# Patient Record
Sex: Female | Born: 1950 | Race: White | Hispanic: No | Marital: Married | Smoking: Never smoker
Health system: Southern US, Community
[De-identification: ages and names within clinical notes are randomized; demographics above are authoritative.]

## PROBLEM LIST (undated history)

## (undated) DIAGNOSIS — G4730 Sleep apnea, unspecified: Secondary | ICD-10-CM

## (undated) DIAGNOSIS — T8859XA Other complications of anesthesia, initial encounter: Secondary | ICD-10-CM

## (undated) DIAGNOSIS — H10829 Rosacea conjunctivitis, unspecified eye: Secondary | ICD-10-CM

## (undated) DIAGNOSIS — Z9981 Dependence on supplemental oxygen: Secondary | ICD-10-CM

## (undated) DIAGNOSIS — E7800 Pure hypercholesterolemia, unspecified: Secondary | ICD-10-CM

## (undated) DIAGNOSIS — M1990 Unspecified osteoarthritis, unspecified site: Secondary | ICD-10-CM

## (undated) DIAGNOSIS — I10 Essential (primary) hypertension: Secondary | ICD-10-CM

## (undated) DIAGNOSIS — Z9889 Other specified postprocedural states: Secondary | ICD-10-CM

## (undated) DIAGNOSIS — L718 Other rosacea: Secondary | ICD-10-CM

## (undated) DIAGNOSIS — T7840XA Allergy, unspecified, initial encounter: Secondary | ICD-10-CM

## (undated) DIAGNOSIS — S72009A Fracture of unspecified part of neck of unspecified femur, initial encounter for closed fracture: Secondary | ICD-10-CM

## (undated) DIAGNOSIS — R112 Nausea with vomiting, unspecified: Secondary | ICD-10-CM

## (undated) DIAGNOSIS — T4145XA Adverse effect of unspecified anesthetic, initial encounter: Secondary | ICD-10-CM

## (undated) DIAGNOSIS — G4710 Hypersomnia, unspecified: Secondary | ICD-10-CM

## (undated) DIAGNOSIS — E079 Disorder of thyroid, unspecified: Secondary | ICD-10-CM

## (undated) DIAGNOSIS — R0902 Hypoxemia: Secondary | ICD-10-CM

## (undated) HISTORY — DX: Sleep apnea, unspecified: G47.30

## (undated) HISTORY — DX: Allergy, unspecified, initial encounter: T78.40XA

## (undated) HISTORY — DX: Rosacea conjunctivitis, unspecified eye: H10.829

## (undated) HISTORY — PX: TOTAL KNEE ARTHROPLASTY: SHX125

## (undated) HISTORY — DX: Unspecified osteoarthritis, unspecified site: M19.90

## (undated) HISTORY — DX: Fracture of unspecified part of neck of unspecified femur, initial encounter for closed fracture: S72.009A

## (undated) HISTORY — DX: Hypoxemia: R09.02

## (undated) HISTORY — PX: BREAST BIOPSY: SHX20

## (undated) HISTORY — DX: Pure hypercholesterolemia, unspecified: E78.00

## (undated) HISTORY — DX: Other rosacea: L71.8

## (undated) HISTORY — DX: Hypersomnia, unspecified: G47.10

## (undated) HISTORY — DX: Dependence on supplemental oxygen: Z99.81

## (undated) HISTORY — PX: JOINT REPLACEMENT: SHX530

---
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
## 1982-07-16 HISTORY — PX: TUBAL LIGATION: SHX77

## 1983-07-17 DIAGNOSIS — S72009A Fracture of unspecified part of neck of unspecified femur, initial encounter for closed fracture: Secondary | ICD-10-CM

## 1983-07-17 HISTORY — DX: Fracture of unspecified part of neck of unspecified femur, initial encounter for closed fracture: S72.009A

## 1985-07-16 HISTORY — PX: BREAST SURGERY: SHX581

## 1998-01-12 ENCOUNTER — Other Ambulatory Visit: Admission: RE | Admit: 1998-01-12 | Discharge: 1998-01-12 | Payer: Self-pay | Admitting: Obstetrics and Gynecology

## 1999-10-12 ENCOUNTER — Encounter: Admission: RE | Admit: 1999-10-12 | Discharge: 1999-10-12 | Payer: Self-pay | Admitting: Obstetrics and Gynecology

## 1999-10-12 ENCOUNTER — Encounter: Payer: Self-pay | Admitting: Obstetrics and Gynecology

## 2001-01-02 ENCOUNTER — Encounter: Payer: Self-pay | Admitting: Obstetrics and Gynecology

## 2001-01-02 ENCOUNTER — Encounter: Admission: RE | Admit: 2001-01-02 | Discharge: 2001-01-02 | Payer: Self-pay | Admitting: Obstetrics and Gynecology

## 2001-12-31 ENCOUNTER — Ambulatory Visit (HOSPITAL_BASED_OUTPATIENT_CLINIC_OR_DEPARTMENT_OTHER): Admission: RE | Admit: 2001-12-31 | Discharge: 2001-12-31 | Payer: Self-pay | Admitting: Orthopedic Surgery

## 2002-01-28 ENCOUNTER — Encounter: Admission: RE | Admit: 2002-01-28 | Discharge: 2002-01-28 | Payer: Self-pay | Admitting: Obstetrics and Gynecology

## 2002-01-28 ENCOUNTER — Encounter: Payer: Self-pay | Admitting: Obstetrics and Gynecology

## 2003-01-26 ENCOUNTER — Encounter: Admission: RE | Admit: 2003-01-26 | Discharge: 2003-01-26 | Payer: Self-pay | Admitting: Obstetrics and Gynecology

## 2003-01-26 ENCOUNTER — Encounter: Payer: Self-pay | Admitting: Obstetrics and Gynecology

## 2004-02-09 ENCOUNTER — Encounter: Admission: RE | Admit: 2004-02-09 | Discharge: 2004-02-09 | Payer: Self-pay | Admitting: Obstetrics and Gynecology

## 2005-05-23 ENCOUNTER — Other Ambulatory Visit: Admission: RE | Admit: 2005-05-23 | Discharge: 2005-05-23 | Payer: Self-pay | Admitting: Internal Medicine

## 2005-07-04 ENCOUNTER — Encounter: Admission: RE | Admit: 2005-07-04 | Discharge: 2005-07-04 | Payer: Self-pay | Admitting: Internal Medicine

## 2006-07-18 ENCOUNTER — Encounter: Admission: RE | Admit: 2006-07-18 | Discharge: 2006-07-18 | Payer: Self-pay | Admitting: Obstetrics and Gynecology

## 2008-03-17 ENCOUNTER — Encounter: Admission: RE | Admit: 2008-03-17 | Discharge: 2008-03-17 | Payer: Self-pay | Admitting: Family Medicine

## 2008-04-28 ENCOUNTER — Inpatient Hospital Stay (HOSPITAL_COMMUNITY): Admission: AD | Admit: 2008-04-28 | Discharge: 2008-05-01 | Payer: Self-pay | Admitting: Orthopedic Surgery

## 2009-05-12 ENCOUNTER — Encounter: Admission: RE | Admit: 2009-05-12 | Discharge: 2009-05-12 | Payer: Self-pay

## 2010-06-20 ENCOUNTER — Encounter: Admission: RE | Admit: 2010-06-20 | Discharge: 2010-06-20 | Payer: Self-pay | Admitting: Internal Medicine

## 2010-11-28 NOTE — Op Note (Signed)
NAMESHELI, [name] NO.:  [id number]   MEDICAL RECORD NO.:  [id number]          PATIENT TYPE:  OIB   LOCATION:  5035                         FACILITY:  [facility]   PHYSICIAN:  Loreta Ave, M.D. DATE OF BIRTH:  1950/09/04   DATE OF PROCEDURE:  04/28/2008  DATE OF DISCHARGE:                               OPERATIVE REPORT   PREOPERATIVE DIAGNOSES:  Left knee end-stage degenerative arthritis,  varus alignment, and flexion contracture.   POSTOPERATIVE DIAGNOSES:  Left knee end-stage degenerative arthritis,  varus alignment, and flexion contracture.   PROCEDURE:  Left total knee replacement with a modified minimally  invasive system.  [employer] components.  Soft tissue balancing  medial capsule release.  Cemented pegged posterior stabilized #4 femoral  component.  Cemented #4 tibial component with 9-mm polyethylene insert.  Resurfacing the cemented pegged medial offset 32-mm patellar component.   SURGEON:  Loreta Ave, MD   ASSISTANT:  Genene Churn. Barry Dienes, [place], present throughout the entire case and  necessary for timely completion of procedure.   ANESTHESIA:  General.   ESTIMATED BLOOD LOSS:  Minimal.   SPECIMENS:  None.   COMPLICATIONS:  None.   DRESSINGS:  Soft compressive knee immobilizer.   DRAINS:  Hemovac x1.   TOURNIQUET TIME:  1 hour.   PROCEDURE IN DETAIL:  The patient was brought to the operating room and  placed on the operative table in supine position.  After adequate  anesthesia had been obtained, the knee was examined.  A 5-degree flexion  contracture with further flexion of 100.  Alignment 5 degrees of varus  correctable to neutral.  Tourniquet was applied.  Prepped and draped in  the usual sterile fashion.  Exsanguinated and elevation with Esmarch.  Tourniquet inflated to 250 mmHg.  Straight incision above the patella  down to tibial tubercle.  Medial arthrotomy with vastus splitting and  preserving quad tendon.  Knee was  exposed.  Medial capsule was released.  Grade 4 changes throughout.  Remnants of menisci, cruciate ligaments,  and periarticular spurs were removed.  Distal femur was exposed.  Intramedullary guide was placed.  A 10-mm resection set at 5 degrees of  valgus.  Using epicondylar axis, it was marked, sized, cut, and fitted  for #4 pegged posterior stabilized femoral component.  Proximal tibia  was exposed.  Extramedullary guide was placed.  A 3-degree posterior  slope cut.  Extramedullary guide.  Sufficient resection for the 9-mm  insert.  Size #4 component.  Recessed, examined, and all spurs were  removed.  Comfort with all cuts.  With trials in place, #4 on the femur,  #4 on the tibia, and a 9-mm insert, full extension, full flexion, good  alignment, and good stability.  Knee was marked for rotation of the  tibia and then tibia hand-reamed.  Patella was exposed.  Spurs were  removed.  Posterior 10 mm was resected, drilled, sized, and fitted for a  32-mm component.  All trials were removed.  Copious irrigation with  pulse irrigating device.  Cement prepared and placed on all components  which were firmly  seated.  Polyethylene attached to tibia and the knee  was reduced.  Once the cement hardened, it was reexamined.  Full  extension, full flexion, good alignment, good stability, and good  patellofemoral tracking.  Hemovac placed and brought out through a  separate stab wound.  Arthrotomy closed with #1 Vicryl.  Skin and  subcutaneous tissue with Vicryl and staples.  Knee injected with  Marcaine and Hemovac clamped.  Sterile compressive dressing was applied.  Tourniquet was deflated and removed.  Knee immobilizer was applied.  Anesthesia was reversed.  Brought to the recovery room.  Tolerated the  surgery well.  No complications.      Loreta Ave, M.D.  Electronically Signed     DFM/MEDQ  D:  04/28/2008  T:  04/28/2008  Job:  [id number]

## 2010-12-01 NOTE — Procedures (Signed)
[facility]  Patient:    Angelica Levine, Angelica Levine Visit Number: [id number] MRN: [id number]          Service Type: DSU Location: [facility] Attending Physician:  Colbert Ewing Dictated by:   Loreta Ave, M.D. Proc. 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  Tethering as well.  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  Tethering as well.  OPERATIVE PROCEDURE:  Right knee exam under anesthesia, arthroscopy, with extensive chondroplasty of the patella.  Arthroscopic lateral retinacular release.  SURGEON:  Loreta Ave, M.D.  ASSISTANT:  Arlys John D. Petrarca, P.A.-C.  ANESTHESIA:  General.  BLOOD LOSS:  Minimal.  TOURNIQUET:  Not employed.  SPECIMENS:  None.  CULTURES: None.  COMPLICATIONS:  None.  DRESSINGS:  Soft compressive with lateral bolster.  PROCEDURE:  The patient was brought to the operating room and placed on the operating table in the supine position.  After adequate anesthesia had been obtained, both knees were examination.  Full motion and good stability. Patellofemoral crepitus.  Lateral tracking.  Lateral tethering, right greater than left.  Tourniquet and leg holder applied, right leg.  Prepped and draped in the usual sterile fashion.  Three portals created, one superolateral, one each medial and lateral patellar.  Inflow catheter introduced.  Knee distended.  Arthroscope introduced.  Knee inspected.  Significant grade 3 chondromalacia, especially on the peak of the patella and lateral facet. Marked lateral tracking and tethering.  Trochlea looked good.  Extensive chondroplasty of the patella to a stable surface where there were areas of grade 3 change with fibrillation.  Chondral loose bodies were  removed. Remaining knee examined.  Cruciate ligaments, medial and lateral compartment, and medial and lateral meniscus normal.  After thorough chondroplasty and assessment of tracking, arthroscopic release performed from vastus lateralis superiorly down to the lateral joint line inferiorly.  Marked improvement of tethering with good restoration of tracking and even chondral forces throughout the patellofemoral joint achieved after lateral release. Hemostasis with cautery.  The instruments and fluid removed.  Portals of the knee were injected with Marcaine.  Portals closed with 4-0 nylon.  Sterile compressive dressing applied.  Anesthesia reversed.  Brought to the recovery room.  Tolerated surgery well with no complications. Dictated by:   Loreta Ave, M.D. Attending Physician:  Colbert Ewing DD:  12/31/01 TD:  01/01/02 Job: [id number]

## 2010-12-01 NOTE — Discharge Summary (Signed)
NAMEJACALYNN, BUZZELL                ACCOUNT NO.:  [id number]   MEDICAL RECORD NO.:  [id number]          PATIENT TYPE:  INP   LOCATION:  5035                         FACILITY:  [facility]   PHYSICIAN:  Loreta Ave, M.D. DATE OF BIRTH:  1951-06-28   DATE OF ADMISSION:  04/28/2008  DATE OF DISCHARGE:  05/01/2008                               DISCHARGE SUMMARY   FINAL DIAGNOSES:  1. Status post left total knee replacement with end-stage degenerative      joint disease.  2. Hypothyroidism.  3. Diverticulosis.   HISTORY OF PRESENT ILLNESS:  This is a 60 year old white female with  history of end-stage DJD left knee and chronic pain presented to our  office for preop evaluation for a total knee replacement.  She had  progressive worsening pain with failed response to conservative  treatment.  Significant decrease in her daily activities due to the  ongoing complaint.   HOSPITAL COURSE:  On April 28, 2008, the patient was taken to the  [facility] OR and a left total knee replacement procedure performed.   SURGEON:  Loreta Ave, MD   ASSISTANCE:  Genene Churn. Barry Dienes, [place]   ANESTHESIA:  General.   SPECIMENS:  None.   ESTIMATED BLOOD LOSS:  Minimal.   TOURNIQUET TIME:  70 minutes.   DRAINS:  1 Hemovac drain placed.   There were no surgical or anesthesia complications, and the patient was  transferred to Recovery in stable condition.   On April 29, 2008, the patient was doing well with good pain control.  Vital signs stable and febrile.  Hemoglobin 11.2 and INR 1.2.  Dressing  clean, dry, and intact.  Calf nontender and neurovascularly intact.  Chest CT scan ordered due to abnormal preop chest x-ray.  Pharmacy  protocol Coumadin started.  PT/OT consults.  On April 30, 2008, the  patient doing well with good pain control.  Temperature 100.5, pulse  101, respirations 20, and blood pressure 150/83.  Hemoglobin 10.7 and  INR 1.4.  Electrolytes stable.  Wound looks good and  staples intact.  No  drains or signs of infection.  Hemovac drain discontinued.  Calf  nontender and neurovascularly intact.  The patient is progressing well  with therapy.  Discontinued PCA, Foley and IV.  CT chest showed a left  lung hilar calcified granuloma.  Calcification in spleen.  Minimal  atelectasis.  I  recommended followup CT in a year.  On May 01, 2008, the patient doing well with good pain control.  She states that  she is ready to go home.  The patient complained about dysuria .  UA  positive for UTI.  Cipro was started.  Wound looks good and staples  intact.  No drainage or signs of infection.  Calf nontender and  neurovascularly intact.   DISCHARGE MEDICATIONS:  1. Percocet 10/325 1-2 tablets p.o. every 4-6 hours p.r.n. for pain.  2. Robaxin 500 mg 1 tablet p.o. every 6 hours for her spasms.  3. Coumadin pharmacy protocol.  4. Lovenox 30 mg 1 subcu injection b.i.d. x3 days stop when Coumadin  is a therapeutic with INR of 2-3.  5. Cipro 500 mg one tablet p.o. b.i.d. x5 days for UTI.   DISPOSITION:  Discharged home.   CONDITION:  Good and stable.   INSTRUCTIONS:  The patient will work with home health PT and OT to  improve knee strengthening and range of motion.  Weightbearing as  tolerated.  Daily dressing changes with 4x4 gauze and tape.  Coumadin x4  weeks postop for DVT prophylaxis.  Follow up when she is at 2 weeks open  surgery for recheck.  Return sooner if needed.      Loreta Ave, M.D.  Electronically Signed     DFM/MEDQ  D:  06/30/2008  T:  07/01/2008  Job:  [id number]

## 2011-03-28 ENCOUNTER — Other Ambulatory Visit: Payer: Self-pay | Admitting: Obstetrics and Gynecology

## 2011-03-28 DIAGNOSIS — Z1231 Encounter for screening mammogram for malignant neoplasm of breast: Secondary | ICD-10-CM

## 2011-04-17 LAB — COMPREHENSIVE METABOLIC PANEL
AST: 34
Albumin: 3.9
Alkaline Phosphatase: 95
CO2: 28
Calcium: 9.7
Chloride: 104
Glucose, Bld: 79
Total Bilirubin: 0.5

## 2011-04-17 LAB — BASIC METABOLIC PANEL
BUN: 4 — ABNORMAL LOW
BUN: 6
BUN: 6
CO2: 27
CO2: 29
Calcium: 8.6
Calcium: 8.8
Chloride: 102
Chloride: 102
Chloride: 99
Creatinine, Ser: 0.65
Creatinine, Ser: 0.71
GFR calc Af Amer: >60
GFR calc Af Amer: >60
GFR calc non Af Amer: >60
Glucose, Bld: 144 — ABNORMAL HIGH
Glucose, Bld: 197 — ABNORMAL HIGH
Sodium: 139

## 2011-04-17 LAB — URINALYSIS, ROUTINE W REFLEX MICROSCOPIC
Bilirubin Urine: NEGATIVE
Glucose, UA: NEGATIVE
Hgb urine dipstick: NEGATIVE
Ketones, ur: NEGATIVE
Specific Gravity, Urine: 1.006
Urobilinogen, UA: 0.2
pH: 7

## 2011-04-17 LAB — CBC
HCT: 33.2 — ABNORMAL LOW
HCT: 42.6
Hemoglobin: 11.1 — ABNORMAL LOW
Hemoglobin: 14.4
MCHC: 33.8
MCHC: 33.8
MCHC: 34.1
MCV: 88.1
Platelets: 199
Platelets: 223
Platelets: 275
RBC: 3.56 — ABNORMAL LOW
RBC: 3.69 — ABNORMAL LOW
RBC: 4.86
RDW: 12.9
RDW: 13.2
WBC: 10.3
WBC: 7

## 2011-04-17 LAB — URINE MICROSCOPIC-ADD ON

## 2011-04-17 LAB — TYPE AND SCREEN: Antibody Screen: NEGATIVE

## 2011-04-17 LAB — PROTIME-INR
Prothrombin Time: 13.5
Prothrombin Time: 15.2

## 2011-04-17 LAB — URINE CULTURE: Colony Count: 45000

## 2011-04-20 ENCOUNTER — Ambulatory Visit
Admission: RE | Admit: 2011-04-20 | Discharge: 2011-04-20 | Disposition: A | Discharge status: Home / Self Care | Payer: BC Managed Care – PPO | Source: Ambulatory Visit | Attending: Obstetrics and Gynecology | Admitting: Obstetrics and Gynecology

## 2011-04-20 DIAGNOSIS — Z1231 Encounter for screening mammogram for malignant neoplasm of breast: Secondary | ICD-10-CM

## 2011-06-12 ENCOUNTER — Other Ambulatory Visit: Payer: Self-pay | Admitting: Internal Medicine

## 2011-06-12 DIAGNOSIS — Z1231 Encounter for screening mammogram for malignant neoplasm of breast: Secondary | ICD-10-CM

## 2011-06-22 ENCOUNTER — Ambulatory Visit
Admission: RE | Admit: 2011-06-22 | Discharge: 2011-06-22 | Disposition: A | Discharge status: Home / Self Care | Payer: BC Managed Care – PPO | Source: Ambulatory Visit | Attending: Internal Medicine | Admitting: Internal Medicine

## 2011-06-22 DIAGNOSIS — Z1231 Encounter for screening mammogram for malignant neoplasm of breast: Secondary | ICD-10-CM

## 2011-11-19 ENCOUNTER — Encounter: Payer: Self-pay | Admitting: Internal Medicine

## 2011-11-19 ENCOUNTER — Ambulatory Visit (INDEPENDENT_AMBULATORY_CARE_PROVIDER_SITE_OTHER): Payer: BC Managed Care – PPO | Admitting: Internal Medicine

## 2011-11-19 VITALS — BP 163/81 | HR 64 | Temp 97.8°F | Resp 16 | Ht 64.25 in | Wt 163.4 lb

## 2011-11-19 DIAGNOSIS — E789 Disorder of lipoprotein metabolism, unspecified: Secondary | ICD-10-CM

## 2011-11-19 DIAGNOSIS — E7889 Other lipoprotein metabolism disorders: Secondary | ICD-10-CM

## 2011-11-19 DIAGNOSIS — M1990 Unspecified osteoarthritis, unspecified site: Secondary | ICD-10-CM | POA: Insufficient documentation

## 2011-11-19 DIAGNOSIS — J309 Allergic rhinitis, unspecified: Secondary | ICD-10-CM | POA: Insufficient documentation

## 2011-11-19 DIAGNOSIS — E039 Hypothyroidism, unspecified: Secondary | ICD-10-CM | POA: Insufficient documentation

## 2011-11-19 DIAGNOSIS — M79609 Pain in unspecified limb: Secondary | ICD-10-CM

## 2011-11-19 DIAGNOSIS — R634 Abnormal weight loss: Secondary | ICD-10-CM

## 2011-11-19 DIAGNOSIS — M79646 Pain in unspecified finger(s): Secondary | ICD-10-CM

## 2011-11-19 LAB — CBC WITH DIFFERENTIAL/PLATELET
Basophils Relative: 0 % (ref 0–1)
Hemoglobin: 14.9 g/dL (ref 12.0–15.0)
Lymphocytes Relative: 23 % (ref 12–46)
MCHC: 34 g/dL (ref 30.0–36.0)
Monocytes Relative: 5 % (ref 3–12)
Neutro Abs: 5.1 10*3/uL (ref 1.7–7.7)
Neutrophils Relative %: 71 % (ref 43–77)
RBC: 5.02 MIL/uL (ref 3.87–5.11)
WBC: 7.2 10*3/uL (ref 4.0–10.5)

## 2011-11-19 LAB — POCT GLYCOSYLATED HEMOGLOBIN (HGB A1C): Hemoglobin A1C: 5.5

## 2011-11-19 LAB — GLUCOSE, POCT (MANUAL RESULT ENTRY): POC Glucose: 99

## 2011-11-19 NOTE — Progress Notes (Signed)
Subjective:    Patient ID: Angelica Levine, female    DOB: [birth year], 61 y.o.   MRN: [id number]  HPI Needs thyroid and triglycerides cked. Also has a thumb popping, has djd many joints Also has dry mouth and worried about diabetes  Review of Systems See problem list    Objective:   Physical Exam Left trigger thumb, not red or tender Thyroid normal size Lungs and heart normal  BP 154/84 Results for orders placed in visit on 11/19/11  GLUCOSE, POCT (MANUAL RESULT ENTRY)      Component Value Range   POC Glucose 99    POCT GLYCOSYLATED HEMOGLOBIN (HGB A1C)      Component Value Range   Hemoglobin A1C 5.5      Results for orders placed during the hospital encounter of 04/28/08  CBC      Component Value Range   WBC 7.9     RBC 4.86     Hemoglobin 14.4     HCT 42.6     MCV 87.7     MCHC 33.8     RDW 13.2     Platelets 275    COMPREHENSIVE METABOLIC PANEL      Component Value Range   Sodium 141     Potassium 3.8     Chloride 104     CO2 28     Glucose, Bld 79     BUN 9     Creatinine, Ser 0.70     Calcium 9.7     Total Protein 6.9     Albumin 3.9     AST 34     ALT 37 (*)    Alkaline Phosphatase 95     Total Bilirubin 0.5     GFR calc non Af Amer >60     GFR calc Af Amer       Value: >60            The eGFR has been calculated     using the MDRD equation.     This calculation has not been     validated in all clinical  PROTIME-INR      Component Value Range   Prothrombin Time 13.5     INR 1.0    APTT      Component Value Range   aPTT 28    URINALYSIS, ROUTINE W REFLEX MICROSCOPIC      Component Value Range   Color, Urine YELLOW     APPearance CLEAR     Specific Gravity, Urine 1.013     pH 5.5     Glucose, UA NEGATIVE     Hgb urine dipstick NEGATIVE     Bilirubin Urine NEGATIVE     Ketones, ur NEGATIVE     Protein, ur NEGATIVE     Urobilinogen, UA 0.2     Nitrite NEGATIVE     Leukocytes, UA       Value: NEGATIVE MICROSCOPIC NOT DONE ON URINES WITH  NEGATIVE PROTEIN, BLOOD, LEUKOCYTES, NITRITE, OR GLUCOSE <1000 mg/dL.  TYPE AND SCREEN      Component Value Range   ABO/RH(D) O POS     Antibody Screen NEG     Sample Expiration 05/01/2008    ABO/RH      Component Value Range   ABO/RH(D) O POS    BASIC METABOLIC PANEL      Component Value Range   Sodium 136     Potassium 4.1     Chloride 102  CO2 29     Glucose, Bld 144 (*)    BUN 6     Creatinine, Ser 0.65     Calcium 8.6     GFR calc non Af Amer >60     GFR calc Af Amer       Value: >60            The eGFR has been calculated     using the MDRD equation.     This calculation has not been     validated in all clinical  CBC      Component Value Range   WBC 7.0     RBC 3.76 (*)    Hemoglobin 11.2 (*)    HCT 33.2 (*)    MCV 88.3     MCHC 33.8     RDW 12.9     Platelets 199    PROTIME-INR      Component Value Range   Prothrombin Time 15.2     INR 1.2    BASIC METABOLIC PANEL      Component Value Range   Sodium 139     Potassium 4.4     Chloride 102     CO2 32     Glucose, Bld 144 (*)    BUN 4 (*)    Creatinine, Ser 0.55     Calcium 8.6     GFR calc non Af Amer >60     GFR calc Af Amer       Value: >60            The eGFR has been calculated     using the MDRD equation.     This calculation has not been     validated in all clinical  CBC      Component Value Range   WBC 7.4     RBC 3.56 (*)    Hemoglobin 10.7 (*)    HCT 31.6 (*)    MCV 88.9     MCHC 33.9     RDW 13.1     Platelets 197    PROTIME-INR      Component Value Range   Prothrombin Time 17.6 (*)    INR 1.4    BASIC METABOLIC PANEL      Component Value Range   Sodium 136     Potassium 4.0     Chloride 99     CO2 27     Glucose, Bld 197 (*)    BUN 6     Creatinine, Ser 0.71     Calcium 8.8     GFR calc non Af Amer >60     GFR calc Af Amer       Value: >60            The eGFR has been calculated     using the MDRD equation.     This calculation has not been     validated in  all clinical  CBC      Component Value Range   WBC 10.3     RBC 3.69 (*)    Hemoglobin 11.1 (*)    HCT 32.5 (*)    MCV 88.1     MCHC 34.1     RDW 12.8     Platelets 223    PROTIME-INR      Component Value Range   Prothrombin Time 17.6 (*)    INR 1.4    URINE CULTURE  Component Value Range   Specimen Description URINE, CLEAN CATCH     Special Requests IMMUNE:NORM UT SYMPT:NEG     Colony Count 45,000 COLONIES/ML     Culture ESCHERICHIA COLI     Report Status 05/03/2008 FINAL     Organism ID, Bacteria ESCHERICHIA COLI    URINALYSIS, ROUTINE W REFLEX MICROSCOPIC      Component Value Range   Color, Urine YELLOW     APPearance CLOUDY (*)    Specific Gravity, Urine 1.006     pH 7.0     Glucose, UA NEGATIVE     Hgb urine dipstick LARGE (*)    Bilirubin Urine NEGATIVE     Ketones, ur NEGATIVE     Protein, ur NEGATIVE     Urobilinogen, UA 0.2     Nitrite NEGATIVE     Leukocytes, UA LARGE (*)   URINE MICROSCOPIC-ADD ON      Component Value Range   Squamous Epithelial / LPF RARE     WBC, UA 11-20     RBC / HPF 3-6     Bacteria, UA MANY (*)        Assessment & Plan:  Thumb spica splint DC NSAIDS for bp controll Record home BP, if abn. rtc 2-3weeks CPE in July RF meds 3 mos.

## 2011-11-20 LAB — COMPREHENSIVE METABOLIC PANEL
ALT: 13 U/L (ref 0–35)
AST: 19 U/L (ref 0–37)
Albumin: 4.5 g/dL (ref 3.5–5.2)
Alkaline Phosphatase: 90 U/L (ref 39–117)
Calcium: 9.8 mg/dL (ref 8.4–10.5)
Chloride: 104 mEq/L (ref 96–112)
Potassium: 4.2 mEq/L (ref 3.5–5.3)
Sodium: 139 mEq/L (ref 135–145)
Total Protein: 7.5 g/dL (ref 6.0–8.3)

## 2011-11-20 LAB — LIPID PANEL
HDL: 43 mg/dL (ref >39)
LDL Cholesterol: 155 mg/dL — ABNORMAL HIGH (ref 0–99)

## 2011-11-20 LAB — TSH: TSH: 1.74 u[IU]/mL (ref 0.350–4.500)

## 2012-01-07 ENCOUNTER — Encounter (HOSPITAL_COMMUNITY): Payer: Self-pay | Admitting: *Deleted

## 2012-01-07 ENCOUNTER — Encounter (HOSPITAL_COMMUNITY): Payer: Self-pay | Admitting: Emergency Medicine

## 2012-01-07 ENCOUNTER — Emergency Department (HOSPITAL_COMMUNITY)
Admission: EM | Admit: 2012-01-07 | Discharge: 2012-01-07 | Disposition: A | Discharge status: Home / Self Care | Payer: BC Managed Care – PPO | Attending: Emergency Medicine | Admitting: Emergency Medicine

## 2012-01-07 ENCOUNTER — Emergency Department (HOSPITAL_COMMUNITY): Payer: BC Managed Care – PPO

## 2012-01-07 ENCOUNTER — Ambulatory Visit (INDEPENDENT_AMBULATORY_CARE_PROVIDER_SITE_OTHER): Payer: BC Managed Care – PPO | Admitting: Family Medicine

## 2012-01-07 VITALS — BP 171/72 | HR 71 | Temp 98.3°F | Resp 16 | Ht 64.5 in | Wt 163.0 lb

## 2012-01-07 DIAGNOSIS — R030 Elevated blood-pressure reading, without diagnosis of hypertension: Secondary | ICD-10-CM

## 2012-01-07 DIAGNOSIS — Z96659 Presence of unspecified artificial knee joint: Secondary | ICD-10-CM | POA: Insufficient documentation

## 2012-01-07 DIAGNOSIS — R002 Palpitations: Secondary | ICD-10-CM

## 2012-01-07 DIAGNOSIS — I10 Essential (primary) hypertension: Secondary | ICD-10-CM | POA: Insufficient documentation

## 2012-01-07 DIAGNOSIS — Z888 Allergy status to other drugs, medicaments and biological substances status: Secondary | ICD-10-CM | POA: Insufficient documentation

## 2012-01-07 DIAGNOSIS — Z91013 Allergy to seafood: Secondary | ICD-10-CM | POA: Insufficient documentation

## 2012-01-07 DIAGNOSIS — E079 Disorder of thyroid, unspecified: Secondary | ICD-10-CM | POA: Insufficient documentation

## 2012-01-07 DIAGNOSIS — IMO0001 Reserved for inherently not codable concepts without codable children: Secondary | ICD-10-CM

## 2012-01-07 DIAGNOSIS — Z880 Allergy status to penicillin: Secondary | ICD-10-CM | POA: Insufficient documentation

## 2012-01-07 DIAGNOSIS — R0989 Other specified symptoms and signs involving the circulatory and respiratory systems: Secondary | ICD-10-CM

## 2012-01-07 DIAGNOSIS — Z79899 Other long term (current) drug therapy: Secondary | ICD-10-CM | POA: Insufficient documentation

## 2012-01-07 HISTORY — DX: Disorder of thyroid, unspecified: E07.9

## 2012-01-07 HISTORY — DX: Essential (primary) hypertension: I10

## 2012-01-07 LAB — DIFFERENTIAL
Lymphocytes Relative: 19 % (ref 12–46)
Lymphs Abs: 1.7 10*3/uL (ref 0.7–4.0)
Neutrophils Relative %: 74 % (ref 43–77)

## 2012-01-07 LAB — POCT I-STAT TROPONIN I: Troponin i, poc: 0 ng/mL (ref 0.00–0.08)

## 2012-01-07 LAB — URINALYSIS, ROUTINE W REFLEX MICROSCOPIC
Glucose, UA: NEGATIVE mg/dL
Leukocytes, UA: NEGATIVE
pH: 6 (ref 5.0–8.0)

## 2012-01-07 LAB — POCT I-STAT, CHEM 8
BUN: 14 mg/dL (ref 6–23)
Chloride: 104 mEq/L (ref 96–112)
Potassium: 3.8 mEq/L (ref 3.5–5.1)
Sodium: 143 mEq/L (ref 135–145)

## 2012-01-07 LAB — CBC
Hemoglobin: 13.7 g/dL (ref 12.0–15.0)
MCV: 86 fL (ref 78.0–100.0)
Platelets: 200 10*3/uL (ref 150–400)
RBC: 4.7 MIL/uL (ref 3.87–5.11)
WBC: 9 10*3/uL (ref 4.0–10.5)

## 2012-01-07 MED ORDER — AMLODIPINE BESYLATE 5 MG PO TABS: 5.0000 mg | ORAL_TABLET | Freq: Every day | ORAL | Status: DC

## 2012-01-07 NOTE — ED Notes (Signed)
TRANSPORTED TO X-RAY. 

## 2012-01-07 NOTE — ED Provider Notes (Signed)
Medical screening examination/treatment/procedure(s) were performed by non-physician practitioner and as supervising physician I was immediately available for consultation/collaboration.  Stanlee Roehrig Smitty Cords, MD 01/07/12 0600

## 2012-01-07 NOTE — ED Notes (Signed)
The pt had a full work up last night

## 2012-01-07 NOTE — Discharge Instructions (Signed)
Arterial Hypertension Arterial hypertension (high blood pressure) is a condition of elevated pressure in your blood vessels. Hypertension over a long period of time is a risk factor for strokes, heart attacks, and heart failure. It is also the leading cause of kidney (renal) failure.  CAUSES   In Adults -- Over 90% of all hypertension has no known cause. This is called essential or primary hypertension. In the other 10% of people with hypertension, the increase in blood pressure is caused by another disorder. This is called secondary hypertension. Important causes of secondary hypertension are:   Heavy alcohol use.   Obstructive sleep apnea.   Hyperaldosterosim (Conn's syndrome).   Steroid use.   Chronic kidney failure.   Hyperparathyroidism.   Medications.   Renal artery stenosis.   Pheochromocytoma.   Cushing's disease.   Coarctation of the aorta.   Scleroderma renal crisis.   Licorice (in excessive amounts).   Drugs (cocaine, methamphetamine).  Your caregiver can explain any items above that apply to you.  In Children -- Secondary hypertension is more common and should always be considered.   Pregnancy -- Few women of childbearing age have high blood pressure. However, up to 10% of them develop hypertension of pregnancy. Generally, this will not harm the woman. It Even be a sign of 3 complications of pregnancy: preeclampsia, HELLP syndrome, and eclampsia. Follow up and control with medication is necessary.  SYMPTOMS   This condition normally does not produce any noticeable symptoms. It is usually found during a routine exam.   Malignant hypertension is a late problem of high blood pressure. It Monger have the following symptoms:   Headaches.   Blurred vision.   End-organ damage (this means your kidneys, heart, lungs, and other organs are being damaged).   Stressful situations can increase the blood pressure. If a person with normal blood pressure has their blood  pressure go up while being seen by their caregiver, this is often termed "white coat hypertension." Its importance is not known. It Alkire be related with eventually developing hypertension or complications of hypertension.   Hypertension is often confused with mental tension, stress, and anxiety.  DIAGNOSIS  The diagnosis is made by 3 separate blood pressure measurements. They are taken at least 1 week apart from each other. If there is organ damage from hypertension, the diagnosis Lemire be made without repeat measurements. Hypertension is usually identified by having blood pressure readings:  Above 140/90 mmHg measured in both arms, at 3 separate times, over a couple weeks.   Over 130/80 mmHg should be considered a risk factor and Grisanti require treatment in patients with diabetes.  Blood pressure readings over 120/80 mmHg are called "pre-hypertension" even in non-diabetic patients. To get a true blood pressure measurement, use the following guidelines. Be aware of the factors that can alter blood pressure readings.  Take measurements at least 1 hour after caffeine.   Take measurements 30 minutes after smoking and without any stress. This is another reason to quit smoking - it raises your blood pressure.   Use a proper cuff size. Ask your caregiver if you are not sure about your cuff size.   Most home blood pressure cuffs are automatic. They will measure systolic and diastolic pressures. The systolic pressure is the pressure reading at the start of sounds. Diastolic pressure is the pressure at which the sounds disappear. If you are elderly, measure pressures in multiple postures. Try sitting, lying or standing.   Sit at rest for a minimum of   5 minutes before taking measurements.   You should not be on any medications like decongestants. These are found in many cold medications.   Record your blood pressure readings and review them with your caregiver.  If you have hypertension:  Your caregiver  may do tests to be sure you do not have secondary hypertension (see "causes" above).   Your caregiver may also look for signs of metabolic syndrome. This is also called Syndrome X or Insulin Resistance Syndrome. You may have this syndrome if you have type 2 diabetes, abdominal obesity, and abnormal blood lipids in addition to hypertension.   Your caregiver will take your medical and family history and perform a physical exam.   Diagnostic tests may include blood tests (for glucose, cholesterol, potassium, and kidney function), a urinalysis, or an EKG. Other tests may also be necessary depending on your condition.  PREVENTION  There are important lifestyle issues that you can adopt to reduce your chance of developing hypertension:  Maintain a normal weight.   Limit the amount of salt (sodium) in your diet.   Exercise often.   Limit alcohol intake.   Get enough potassium in your diet. Discuss specific advice with your caregiver.   Follow a DASH diet (dietary approaches to stop hypertension). This diet is rich in fruits, vegetables, and low-fat dairy products, and avoids certain fats.  PROGNOSIS  Essential hypertension cannot be cured. Lifestyle changes and medical treatment can lower blood pressure and reduce complications. The prognosis of secondary hypertension depends on the underlying cause. Many people whose hypertension is controlled with medicine or lifestyle changes can live a normal, healthy life.  RISKS AND COMPLICATIONS  While high blood pressure alone is not an illness, it often requires treatment due to its short- and long-term effects on many organs. Hypertension increases your risk for:  CVAs or strokes (cerebrovascular accident).   Heart failure due to chronically high blood pressure (hypertensive cardiomyopathy).   Heart attack (myocardial infarction).   Damage to the retina (hypertensive retinopathy).   Kidney failure (hypertensive nephropathy).  Your caregiver can  explain list items above that apply to you. Treatment of hypertension can significantly reduce the risk of complications. TREATMENT   For overweight patients, weight loss and regular exercise are recommended. Physical fitness lowers blood pressure.   Mild hypertension is usually treated with diet and exercise. A diet rich in fruits and vegetables, fat-free dairy products, and foods low in fat and salt (sodium) can help lower blood pressure. Decreasing salt intake decreases blood pressure in a 1/3 of people.   Stop smoking if you are a smoker.  The steps above are highly effective in reducing blood pressure. While these actions are easy to suggest, they are difficult to achieve. Most patients with moderate or severe hypertension end up requiring medications to bring their blood pressure down to a normal level. There are several classes of medications for treatment. Blood pressure pills (antihypertensives) will lower blood pressure by their different actions. Lowering the blood pressure by 10 mmHg may decrease the risk of complications by as much as 25%. The goal of treatment is effective blood pressure control. This will reduce your risk for complications. Your caregiver will help you determine the best treatment for you according to your lifestyle. What is excellent treatment for one person, may not be for you. HOME CARE INSTRUCTIONS   Do not smoke.   Follow the lifestyle changes outlined in the "Prevention" section.   If you are on medications, follow the directions   carefully. Blood pressure medications must be taken as prescribed. Skipping doses reduces their benefit. It also puts you at risk for problems.   Follow up with your caregiver, as directed.   If you are asked to monitor your blood pressure at home, follow the guidelines in the "Diagnosis" section above.  SEEK MEDICAL CARE IF:   You think you are having medication side effects.   You have recurrent headaches or lightheadedness.     You have swelling in your ankles.   You have trouble with your vision.  SEEK IMMEDIATE MEDICAL CARE IF:   You have sudden onset of chest pain or pressure, difficulty breathing, or other symptoms of a heart attack.   You have a severe headache.   You have symptoms of a stroke (such as sudden weakness, difficulty speaking, difficulty walking).  MAKE SURE YOU:   Understand these instructions.   Will watch your condition.   Will get help right away if you are not doing well or get worse.  Document Released: 07/02/2005 Document Revised: 06/21/2011 Document Reviewed: 01/30/2007 [facility] Patient Information 2012 [place].Hypertension Information As your heart beats, it forces blood through your arteries. This force is your blood pressure. If the pressure is too high, it is called hypertension (HTN) or high blood pressure. HTN is dangerous because you may have it and not know it. High blood pressure may mean that your heart has to work harder to pump blood. Your arteries may be narrow or stiff. The extra work puts you at risk for heart disease, stroke, and other problems.  Blood pressure consists of two numbers, a higher number over a lower, 110/72, for example. It is stated as "110 over 72." The ideal is below 120 for the top number (systolic) and under 80 for the bottom (diastolic).  You should pay close attention to your blood pressure if you have certain conditions such as:  Heart failure.   Prior heart attack.   Diabetes   Chronic kidney disease.   Prior stroke.   Multiple risk factors for heart disease.  To see if you have HTN, your blood pressure should be measured while you are seated with your arm held at the level of the heart. It should be measured at least twice. A one-time elevated blood pressure reading (especially in the Emergency Department) does not mean that you need treatment. There may be conditions in which the blood pressure is different between your right  and left arms. It is important to see your caregiver soon for a recheck. Most people have essential hypertension which means that there is not a specific cause. This type of high blood pressure may be lowered by changing lifestyle factors such as:  Stress.   Smoking.   Lack of exercise.   Excessive weight.   Drug/tobacco/alcohol use.   Eating less salt.  Most people do not have symptoms from high blood pressure until it has caused damage to the body. Effective treatment can often prevent, delay or reduce that damage. TREATMENT  Treatment for high blood pressure, when a cause has been identified, is directed at the cause. There are a large number of medications to treat HTN. These fall into several categories, and your caregiver will help you select the medicines that are best for you. Medications may have side effects. You should review side effects with your caregiver. If your blood pressure stays high after you have made lifestyle changes or started on medicines,   Your medication(s) may need to be  changed.   Other problems may need to be addressed.   Be certain you understand your prescriptions, and know how and when to take your medicine.   Be sure to follow up with your caregiver within the time frame advised (usually within two weeks) to have your blood pressure rechecked and to review your medications.   If you are taking more than one medicine to lower your blood pressure, make sure you know how and at what times they should be taken. Taking two medicines at the same time can result in blood pressure that is too low.  Document Released: 09/04/2005 Document Revised: 03/14/2011 Document Reviewed: 09/11/2007 [facility] Patient Information 2012 [place], [place].Palpitations  A palpitation is the feeling that your heartbeat is irregular or is faster than normal. Although this is frightening, it usually is not serious. Palpitations may be caused by excesses of smoking, caffeine, or  alcohol. They are also brought on by stress and anxiety. Sometimes, they are caused by heart disease. Unless otherwise noted, your caregiver did not find any signs of serious illness at this time. HOME CARE INSTRUCTIONS  To help prevent palpitations:  Drink decaffeinated coffee, tea, and soda pop. Avoid chocolate.   If you smoke or drink alcohol, quit or cut down as much as possible.   Reduce your stress or anxiety level. Biofeedback, yoga, or meditation will help you relax. Physical activity such as swimming, jogging, or walking also may be helpful.  SEEK MEDICAL CARE IF:   You continue to have a fast heartbeat.   Your palpitations occur more often.  SEEK IMMEDIATE MEDICAL CARE IF: You develop chest pain, shortness of breath, severe headache, dizziness, or fainting. Document Released: 06/29/2000 Document Revised: 06/21/2011 Document Reviewed: 08/29/2007 [facility] Patient Information 2012 [place], [place].

## 2012-01-07 NOTE — ED Notes (Signed)
The pt has had high bp for 2-3 days.  She was here last night for the same.  Her bp is higher now.Marland Kitchen

## 2012-01-07 NOTE — ED Notes (Signed)
Pt st's she was at home and checked her blood pressure.  St's B/P was elevated.

## 2012-01-07 NOTE — ED Provider Notes (Signed)
History     CSN: [id number]  Arrival date & time 01/07/12  9562   First MD Initiated Contact with Patient 01/07/12 0340      Chief Complaint  Patient presents with  . Hypertension    (Consider location/radiation/quality/duration/timing/severity/associated sxs/prior treatment) HPI Comments: Patient here after working in the yard all day yesterday and then falling asleep on the couch - she states that she awoke at 11pm and had a salty taste in her mouth and "felt like my blood pressure is up" - she states that she drank some water and then checked her blood pressure and noted it to be elevated at 194/86 - she states that she tried to lay down and rest and began to have a fluttering feeling in her chest, she states that she checked her blood pressure again and it was about the same - she states she became concerned about this and came here - she states that a similar thing happened several weeks ago and her PCP thought that it may have been related to "getting too hot and dehydrated".  She denies chest pain, shortness of breath, nausea, vomiting - reports that she has had diarrhea x 3 since 11pm but no other symptoms including abdominal pain, fever, chills, headache.  Patient is a 61 y.o. female presenting with hypertension. The history is provided by the patient. No language interpreter was used.  Hypertension This is a new problem. The current episode started today. The problem occurs rarely. The problem has been resolved. Pertinent negatives include no abdominal pain, anorexia, arthralgias, change in bowel habit, chest pain, chills, congestion, coughing, diaphoresis, fatigue, fever, headaches, joint swelling, myalgias, nausea, neck pain, numbness, rash, sore throat, swollen glands, urinary symptoms, vertigo, visual change, vomiting or weakness. Nothing aggravates the symptoms. She has tried nothing for the symptoms. The treatment provided no relief.    Past Medical History  Diagnosis Date  .  Hypertension   . Thyroid disease     Past Surgical History  Procedure Date  . Total knee arthroplasty     No family history on file.  History  Substance Use Topics  . Smoking status: Never Smoker   . Smokeless tobacco: Not on file  . Alcohol Use: Yes     wine once in a while    OB History    Grav Para Term Preterm Abortions TAB SAB Ect Mult Living                  Review of Systems  Constitutional: Negative for fever, chills, diaphoresis and fatigue.  HENT: Negative for congestion, sore throat and neck pain.   Respiratory: Negative for cough.   Cardiovascular: Negative for chest pain.  Gastrointestinal: Negative for nausea, vomiting, abdominal pain, anorexia and change in bowel habit.  Genitourinary: Negative for dysuria.  Musculoskeletal: Negative for myalgias, joint swelling and arthralgias.  Skin: Negative for rash.  Neurological: Negative for vertigo, weakness, numbness and headaches.  All other systems reviewed and are negative.    Allergies  Shrimp; Asa; Penicillins; and Sudafed  Home Medications   Current Outpatient Rx  Name Route Sig Dispense Refill  . AZELASTINE HCL 137 MCG/SPRAY NA SOLN Nasal Place 1 spray into the nose 2 (two) times daily. Use in each nostril as directed    . CETIRIZINE HCL 10 MG PO TABS Oral Take 10 mg by mouth daily as needed.    Marland Kitchen VITAMIN D-3 PO Oral Take 1 tablet by mouth 3 (three) times a week.    Marland Kitchen  DOXYCYCLINE (ROSACEA) 40 MG PO CPDR Oral Take 40 mg by mouth every morning.    Marland Kitchen OMEGA-3 FATTY ACIDS 1000 MG PO CAPS Oral Take 2 g by mouth daily.    Marland Kitchen FLUTICASONE PROPIONATE 50 MCG/ACT NA SUSP Nasal Place 2 sprays into the nose daily.    Marland Kitchen LEVOTHYROXINE SODIUM 50 MCG PO TABS Oral Take 50 mcg by mouth daily.      BP 149/81  Pulse 86  Temp 98.1 F (36.7 C) (Oral)  Resp 14  SpO2 98%  Physical Exam  Nursing note and vitals reviewed. Constitutional: She is oriented to person, place, and time. She appears well-developed and  well-nourished. No distress.  HENT:  Head: Normocephalic and atraumatic.  Right Ear: External ear normal.  Left Ear: External ear normal.  Nose: Nose normal.  Mouth/Throat: Oropharynx is clear and moist. No oropharyngeal exudate.  Eyes: Conjunctivae are normal. Pupils are equal, round, and reactive to light. No scleral icterus.  Neck: Normal range of motion. Neck supple.  Cardiovascular: Normal rate, regular rhythm and normal heart sounds.  Exam reveals no gallop and no friction rub.   No murmur heard. Pulmonary/Chest: Effort normal and breath sounds normal. No respiratory distress. She has no wheezes. She has no rales. She exhibits no tenderness.  Abdominal: Soft. Bowel sounds are normal. She exhibits no distension. There is no tenderness.  Musculoskeletal: Normal range of motion. She exhibits no edema and no tenderness.  Lymphadenopathy:    She has no cervical adenopathy.  Neurological: She is alert and oriented to person, place, and time. She has normal reflexes. No cranial nerve deficit. She exhibits normal muscle tone. Coordination normal.  Skin: Skin is warm and dry. No rash noted. No erythema. No pallor.  Psychiatric: She has a normal mood and affect. Her behavior is normal. Judgment and thought content normal.    ED Course  Procedures (including critical care time)  Labs Reviewed  POCT I-STAT, CHEM 8 - Abnormal; Notable for the following:    Glucose, Bld 115 (*)     All other components within normal limits  CBC  DIFFERENTIAL  URINALYSIS, ROUTINE W REFLEX MICROSCOPIC  POCT I-STAT TROPONIN I   Dg Chest 2 View  01/07/2012  *RADIOLOGY REPORT*  Clinical Data: Hypertension.  Shortness of breath.  Tachycardia.  CHEST - 2 VIEW  Comparison: 04/22/2008  Findings: Calcified granuloma in the left mid lung. The heart size and pulmonary vascularity are normal. The lungs appear clear and expanded without focal air space disease or consolidation. No blunting of the costophrenic angles.   Degenerative changes in the thoracic spine.  No significant change since previous study.  IMPRESSION: No evidence of active pulmonary disease.  Original Report Authenticated By: Marlon Pel, M.D.   Results for orders placed during the hospital encounter of 01/07/12  CBC      Component Value Range   WBC 9.0  4.0 - 10.5 K/uL   RBC 4.70  3.87 - 5.11 MIL/uL   Hemoglobin 13.7  12.0 - 15.0 g/dL   HCT 08.6  57.8 - 46.9 %   MCV 86.0  78.0 - 100.0 fL   MCH 29.1  26.0 - 34.0 pg   MCHC 33.9  30.0 - 36.0 g/dL   RDW 62.9  52.8 - 41.3 %   Platelets 200  150 - 400 K/uL  DIFFERENTIAL      Component Value Range   Neutrophils Relative 74  43 - 77 %   Neutro Abs 6.6  1.7 -  7.7 K/uL   Lymphocytes Relative 19  12 - 46 %   Lymphs Abs 1.7  0.7 - 4.0 K/uL   Monocytes Relative 7  3 - 12 %   Monocytes Absolute 0.6  0.1 - 1.0 K/uL   Eosinophils Relative 0  0 - 5 %   Eosinophils Absolute 0.0  0.0 - 0.7 K/uL   Basophils Relative 0  0 - 1 %   Basophils Absolute 0.0  0.0 - 0.1 K/uL  URINALYSIS, ROUTINE W REFLEX MICROSCOPIC      Component Value Range   Color, Urine YELLOW  YELLOW   APPearance CLEAR  CLEAR   Specific Gravity, Urine 1.007  1.005 - 1.030   pH 6.0  5.0 - 8.0   Glucose, UA NEGATIVE  NEGATIVE mg/dL   Hgb urine dipstick NEGATIVE  NEGATIVE   Bilirubin Urine NEGATIVE  NEGATIVE   Ketones, ur NEGATIVE  NEGATIVE mg/dL   Protein, ur NEGATIVE  NEGATIVE mg/dL   Urobilinogen, UA 0.2  0.0 - 1.0 mg/dL   Nitrite NEGATIVE  NEGATIVE   Leukocytes, UA NEGATIVE  NEGATIVE  POCT I-STAT, CHEM 8      Component Value Range   Sodium 143  135 - 145 mEq/L   Potassium 3.8  3.5 - 5.1 mEq/L   Chloride 104  96 - 112 mEq/L   BUN 14  6 - 23 mg/dL   Creatinine, Ser 4.09  0.50 - 1.10 mg/dL   Glucose, Bld 811 (*) 70 - 99 mg/dL   Calcium, Ion 9.14  7.82 - 1.32 mmol/L   TCO2 24  0 - 100 mmol/L   Hemoglobin 13.9  12.0 - 15.0 g/dL   HCT 95.6  21.3 - 08.6 %  POCT I-STAT TROPONIN I      Component Value Range    Troponin i, poc 0.00  0.00 - 0.08 ng/mL   Comment 3            Dg Chest 2 View  01/07/2012  *RADIOLOGY REPORT*  Clinical Data: Hypertension.  Shortness of breath.  Tachycardia.  CHEST - 2 VIEW  Comparison: 04/22/2008  Findings: Calcified granuloma in the left mid lung. The heart size and pulmonary vascularity are normal. The lungs appear clear and expanded without focal air space disease or consolidation. No blunting of the costophrenic angles.  Degenerative changes in the thoracic spine.  No significant change since previous study.  IMPRESSION: No evidence of active pulmonary disease.  Original Report Authenticated By: Marlon Pel, M.D.     Date: 01/07/2012  Rate: 64  Rhythm: normal sinus rhythm  QRS Axis: normal  Intervals: normal  ST/T Wave abnormalities: normal  Conduction Disutrbances:none  Narrative Interpretation: Reviewed by Dr. Manus Gunning  Old EKG Reviewed: unchanged    1. Palpitations   2. Labile hypertension       MDM  Patient here with transient hypertension with no evidence of end organ damage - her blood pressure on discharge is 136/84 so I do not feel that we need to start the patient on blood pressure medication.  Labs and x-ray and EKG were normal - patient will be discharged home with close follow up with PCP.        Izola Price [place], [place] 01/07/12 [phone]

## 2012-01-07 NOTE — Progress Notes (Signed)
HPI: Patient here after working in the yard all day yesterday and then falling asleep on the couch - she states that she awoke at 11pm and had a salty taste in her mouth and "felt like my blood pressure is up" - she states that she drank some water and then checked her blood pressure and noted it to be elevated at 194/86 - she states that she tried to lay down and rest and began to have a fluttering feeling in her chest, she states that she checked her blood pressure again and it was about the same - she states she became concerned about this and came here - she states that a similar thing happened several weeks ago and her PCP thought that it may have been related to "getting too hot and dehydrated". She denies chest pain, shortness of breath, nausea, vomiting - reports that she has had diarrhea x 3 since 11pm but no other symptoms including abdominal pain, fever, chills, headache.  Patient discharged from ED at 6 am.  She went home and lay down on couch.  Husband went to work.  She dozed on and off for 3 months.  She forced fluids and original symptoms returned.  No further diarrhea.  No abdominal pain or nausea now.  Her palpitations returned this morning, none now.  No dizziness.   O:alert, NAD HEENT: unremarkable Chest:  Clear Heart:  Regular Abdomen: active BS, no HSM, nontender Skin:  No significant rash Ext:  No edema BP lying and sitting, left arm: 180/80  A:  Elevated blood pressure with symptoms.  P:   1. Elevated blood pressure  amLODipine (NORVASC) 5 MG tablet  2. Palpitations     Recheck 48 hours

## 2012-01-07 NOTE — ED Notes (Signed)
Old and New EKG given to Dr Rancour 

## 2012-01-08 ENCOUNTER — Emergency Department (HOSPITAL_COMMUNITY)
Admission: EM | Admit: 2012-01-08 | Discharge: 2012-01-08 | Disposition: A | Discharge status: Home / Self Care | Payer: BC Managed Care – PPO | Attending: Emergency Medicine | Admitting: Emergency Medicine

## 2012-01-08 DIAGNOSIS — IMO0001 Reserved for inherently not codable concepts without codable children: Secondary | ICD-10-CM

## 2012-01-08 DIAGNOSIS — I10 Essential (primary) hypertension: Secondary | ICD-10-CM

## 2012-01-08 MED ORDER — AMLODIPINE BESYLATE 5 MG PO TABS: 10.0000 mg | ORAL_TABLET | Freq: Every day | ORAL | Status: DC

## 2012-01-08 MED ORDER — AMLODIPINE BESYLATE 5 MG PO TABS
5.0000 mg | ORAL_TABLET | Freq: Once | ORAL | Status: AC
  Administered 2012-01-08: 5 mg via ORAL
  Filled 2012-01-08: qty 1

## 2012-01-08 NOTE — ED Provider Notes (Signed)
History     CSN: [id number]  Arrival date & time 01/07/12  2141   First MD Initiated Contact with Patient 01/08/12 0102      Chief Complaint  Patient presents with  . Hypertension    (Consider location/radiation/quality/duration/timing/severity/associated sxs/prior treatment) HPI 61 year old female presents to emergency department with report of continued hypertension. Patient was seen last night with elevated blood pressure, had full workup without significant findings. Patient was seen today by her primary care Dr. who started her on Norvasc 5 mg. Patient reports she checked her blood pressure this evening and noted that it was 195/95. Patient reports she "knew" that her blood pressure is high because she was having palpitations. Palpitations lasted for about 15-20 minutes. She denies chest pain, shortness of breath, headache, weakness, numbness or other symptoms. Patient does not have palpitations at this time. Patient reports that when she drinks plenty of fluid, her blood pressure goes down. She has been working outside over the weekend, but reports is not unusual per routine. Patient does not have prior history of hypertension. Past Medical History  Diagnosis Date  . Hypertension   . Thyroid disease     Past Surgical History  Procedure Date  . Total knee arthroplasty     No family history on file.  History  Substance Use Topics  . Smoking status: Never Smoker   . Smokeless tobacco: Not on file  . Alcohol Use: Yes     wine once in a while    OB History    Grav Para Term Preterm Abortions TAB SAB Ect Mult Living                  Review of Systems  All other systems reviewed and are negative.    Allergies  Shrimp; Asa; Penicillins; and Sudafed  Home Medications   Current Outpatient Rx  Name Route Sig Dispense Refill  . AMLODIPINE BESYLATE 5 MG PO TABS Oral Take 1 tablet (5 mg total) by mouth daily. 30 tablet 3  . AZELASTINE HCL 137 MCG/SPRAY NA SOLN Nasal  Place 1 spray into the nose 2 (two) times daily. Use in each nostril as directed    . CETIRIZINE HCL 10 MG PO TABS Oral Take 10 mg by mouth daily as needed.    Marland Kitchen VITAMIN D-3 PO Oral Take 1 tablet by mouth 3 (three) times a week.    Marland Kitchen DOXYCYCLINE (ROSACEA) 40 MG PO CPDR Oral Take 40 mg by mouth every morning.    Marland Kitchen OMEGA-3 FATTY ACIDS 1000 MG PO CAPS Oral Take 2 g by mouth daily.    Marland Kitchen FLUTICASONE PROPIONATE 50 MCG/ACT NA SUSP Nasal Place 2 sprays into the nose daily.    Marland Kitchen LEVOTHYROXINE SODIUM 50 MCG PO TABS Oral Take 50 mcg by mouth daily.      BP 151/78  Pulse 61  Temp 98.3 F (36.8 C) (Oral)  Resp 14  SpO2 99%  Physical Exam  Nursing note and vitals reviewed. Constitutional: She is oriented to person, place, and time. She appears well-developed and well-nourished.  HENT:  Head: Normocephalic and atraumatic.  Nose: Nose normal.  Mouth/Throat: Oropharynx is clear and moist.  Eyes: Conjunctivae and EOM are normal. Pupils are equal, round, and reactive to light.  Neck: Normal range of motion. Neck supple. No JVD present. No tracheal deviation present. No thyromegaly present.  Cardiovascular: Normal rate, regular rhythm, normal heart sounds and intact distal pulses.  Exam reveals no gallop and no friction rub.  No murmur heard. Pulmonary/Chest: Effort normal and breath sounds normal. No stridor. No respiratory distress. She has no wheezes. She has no rales. She exhibits no tenderness.  Abdominal: Soft. Bowel sounds are normal. She exhibits no distension and no mass. There is no tenderness. There is no rebound and no guarding.  Musculoskeletal: Normal range of motion. She exhibits no edema and no tenderness.  Lymphadenopathy:    She has no cervical adenopathy.  Neurological: She is alert and oriented to person, place, and time. She has normal reflexes. She exhibits normal muscle tone. Coordination normal.  Skin: Skin is dry. No rash noted. No erythema. No pallor.  Psychiatric: She has a  normal mood and affect. Her behavior is normal. Judgment and thought content normal.    ED Course  Procedures (including critical care time)  Labs Reviewed - No data to display Dg Chest 2 View  01/07/2012  *RADIOLOGY REPORT*  Clinical Data: Hypertension.  Shortness of breath.  Tachycardia.  CHEST - 2 VIEW  Comparison: 04/22/2008  Findings: Calcified granuloma in the left mid lung. The heart size and pulmonary vascularity are normal. The lungs appear clear and expanded without focal air space disease or consolidation. No blunting of the costophrenic angles.  Degenerative changes in the thoracic spine.  No significant change since previous study.  IMPRESSION: No evidence of active pulmonary disease.  Original Report Authenticated By: Marlon Pel, M.D.     1. Hypertension   2. Elevated blood pressure       MDM  61 year old female with hypertension, negative workup yesterday and seen by primary care Dr. earlier today. We'll increase Norvasc to 10 mg once a day, patient has followup on Wednesday back with her doctor. I do not feel patient has only been hypertension with endorgan damage, do not feel she needs admission for further workup, can be managed outpatient        Olivia Mackie, MD 01/08/12 0201

## 2012-01-08 NOTE — Discharge Instructions (Signed)
Please increase your dosage of amlodipine/Norvasc to 10 mg once a day. Followup as scheduled with Dr. Perrin Maltese on Wednesday. Return to the emergency department for worsening condition or new concerning symptoms.  Arterial Hypertension Arterial hypertension (high blood pressure) is a condition of elevated pressure in your blood vessels. Hypertension over a long period of time is a risk factor for strokes, heart attacks, and heart failure. It is also the leading cause of kidney (renal) failure.  CAUSES   In Adults -- Over 90% of all hypertension has no known cause. This is called essential or primary hypertension. In the other 10% of people with hypertension, the increase in blood pressure is caused by another disorder. This is called secondary hypertension. Important causes of secondary hypertension are:   Heavy alcohol use.   Obstructive sleep apnea.   Hyperaldosterosim (Conn's syndrome).   Steroid use.   Chronic kidney failure.   Hyperparathyroidism.   Medications.   Renal artery stenosis.   Pheochromocytoma.   Cushing's disease.   Coarctation of the aorta.   Scleroderma renal crisis.   Licorice (in excessive amounts).   Drugs (cocaine, methamphetamine).  Your caregiver can explain any items above that apply to you.  In Children -- Secondary hypertension is more common and should always be considered.   Pregnancy -- Few women of childbearing age have high blood pressure. However, up to 10% of them develop hypertension of pregnancy. Generally, this will not harm the woman. It may be a sign of 3 complications of pregnancy: preeclampsia, HELLP syndrome, and eclampsia. Follow up and control with medication is necessary.  SYMPTOMS   This condition normally does not produce any noticeable symptoms. It is usually found during a routine exam.   Malignant hypertension is a late problem of high blood pressure. It may have the following symptoms:   Headaches.   Blurred vision.    End-organ damage (this means your kidneys, heart, lungs, and other organs are being damaged).   Stressful situations can increase the blood pressure. If a person with normal blood pressure has their blood pressure go up while being seen by their caregiver, this is often termed "white coat hypertension." Its importance is not known. It may be related with eventually developing hypertension or complications of hypertension.   Hypertension is often confused with mental tension, stress, and anxiety.  DIAGNOSIS  The diagnosis is made by 3 separate blood pressure measurements. They are taken at least 1 week apart from each other. If there is organ damage from hypertension, the diagnosis may be made without repeat measurements. Hypertension is usually identified by having blood pressure readings:  Above 140/90 mmHg measured in both arms, at 3 separate times, over a couple weeks.   Over 130/80 mmHg should be considered a risk factor and may require treatment in patients with diabetes.  Blood pressure readings over 120/80 mmHg are called "pre-hypertension" even in non-diabetic patients. To get a true blood pressure measurement, use the following guidelines. Be aware of the factors that can alter blood pressure readings.  Take measurements at least 1 hour after caffeine.   Take measurements 30 minutes after smoking and without any stress. This is another reason to quit smoking - it raises your blood pressure.   Use a proper cuff size. Ask your caregiver if you are not sure about your cuff size.   Most home blood pressure cuffs are automatic. They will measure systolic and diastolic pressures. The systolic pressure is the pressure reading at the start of sounds.  Diastolic pressure is the pressure at which the sounds disappear. If you are elderly, measure pressures in multiple postures. Try sitting, lying or standing.   Sit at rest for a minimum of 5 minutes before taking measurements.   You should  not be on any medications like decongestants. These are found in many cold medications.   Record your blood pressure readings and review them with your caregiver.  If you have hypertension:  Your caregiver may do tests to be sure you do not have secondary hypertension (see "causes" above).   Your caregiver may also look for signs of metabolic syndrome. This is also called Syndrome X or Insulin Resistance Syndrome. You may have this syndrome if you have type 2 diabetes, abdominal obesity, and abnormal blood lipids in addition to hypertension.   Your caregiver will take your medical and family history and perform a physical exam.   Diagnostic tests may include blood tests (for glucose, cholesterol, potassium, and kidney function), a urinalysis, or an EKG. Other tests may also be necessary depending on your condition.  PREVENTION  There are important lifestyle issues that you can adopt to reduce your chance of developing hypertension:  Maintain a normal weight.   Limit the amount of salt (sodium) in your diet.   Exercise often.   Limit alcohol intake.   Get enough potassium in your diet. Discuss specific advice with your caregiver.   Follow a DASH diet (dietary approaches to stop hypertension). This diet is rich in fruits, vegetables, and low-fat dairy products, and avoids certain fats.  PROGNOSIS  Essential hypertension cannot be cured. Lifestyle changes and medical treatment can lower blood pressure and reduce complications. The prognosis of secondary hypertension depends on the underlying cause. Many people whose hypertension is controlled with medicine or lifestyle changes can live a normal, healthy life.  RISKS AND COMPLICATIONS  While high blood pressure alone is not an illness, it often requires treatment due to its short- and long-term effects on many organs. Hypertension increases your risk for:  CVAs or strokes (cerebrovascular accident).   Heart failure due to chronically  high blood pressure (hypertensive cardiomyopathy).   Heart attack (myocardial infarction).   Damage to the retina (hypertensive retinopathy).   Kidney failure (hypertensive nephropathy).  Your caregiver can explain list items above that apply to you. Treatment of hypertension can significantly reduce the risk of complications. TREATMENT   For overweight patients, weight loss and regular exercise are recommended. Physical fitness lowers blood pressure.   Mild hypertension is usually treated with diet and exercise. A diet rich in fruits and vegetables, fat-free dairy products, and foods low in fat and salt (sodium) can help lower blood pressure. Decreasing salt intake decreases blood pressure in a 1/3 of people.   Stop smoking if you are a smoker.  The steps above are highly effective in reducing blood pressure. While these actions are easy to suggest, they are difficult to achieve. Most patients with moderate or severe hypertension end up requiring medications to bring their blood pressure down to a normal level. There are several classes of medications for treatment. Blood pressure pills (antihypertensives) will lower blood pressure by their different actions. Lowering the blood pressure by 10 mmHg may decrease the risk of complications by as much as 25%. The goal of treatment is effective blood pressure control. This will reduce your risk for complications. Your caregiver will help you determine the best treatment for you according to your lifestyle. What is excellent treatment for one person, may  not be for you. HOME CARE INSTRUCTIONS   Do not smoke.   Follow the lifestyle changes outlined in the "Prevention" section.   If you are on medications, follow the directions carefully. Blood pressure medications must be taken as prescribed. Skipping doses reduces their benefit. It also puts you at risk for problems.   Follow up with your caregiver, as directed.   If you are asked to monitor  your blood pressure at home, follow the guidelines in the "Diagnosis" section above.  SEEK MEDICAL CARE IF:   You think you are having medication side effects.   You have recurrent headaches or lightheadedness.   You have swelling in your ankles.   You have trouble with your vision.  SEEK IMMEDIATE MEDICAL CARE IF:   You have sudden onset of chest pain or pressure, difficulty breathing, or other symptoms of a heart attack.   You have a severe headache.   You have symptoms of a stroke (such as sudden weakness, difficulty speaking, difficulty walking).  MAKE SURE YOU:   Understand these instructions.   Will watch your condition.   Will get help right away if you are not doing well or get worse.  Document Released: 07/02/2005 Document Revised: 06/21/2011 Document Reviewed: 01/30/2007 [facility] Patient Information 2012 [place], [place].

## 2012-01-09 ENCOUNTER — Ambulatory Visit (INDEPENDENT_AMBULATORY_CARE_PROVIDER_SITE_OTHER): Payer: BC Managed Care – PPO | Admitting: Family Medicine

## 2012-01-09 ENCOUNTER — Encounter: Payer: Self-pay | Admitting: Family Medicine

## 2012-01-09 VITALS — BP 149/75 | HR 84 | Temp 98.0°F | Resp 16 | Ht 64.5 in | Wt 157.8 lb

## 2012-01-09 DIAGNOSIS — I10 Essential (primary) hypertension: Secondary | ICD-10-CM

## 2012-01-09 NOTE — Progress Notes (Signed)
HPI: Patient here after working in the yard all day yesterday and then falling asleep on the couch - she states that she awoke at 11pm and had a salty taste in her mouth and "felt like my blood pressure is up" - she states that she drank some water and then checked her blood pressure and noted it to be elevated at 194/86 - she states that she tried to lay down and rest and began to have a fluttering feeling in her chest, she states that she checked her blood pressure again and it was about the same - she states she became concerned about this and came here - she states that a similar thing happened several weeks ago and her PCP thought that it may have been related to "getting too hot and dehydrated". She denies chest pain, shortness of breath, nausea, vomiting - reports that she has had diarrhea x 3 since 11pm but no other symptoms including abdominal pain, fever, chills, headache.  Patient discharged from ED at 6 am. She went home and lay down on couch. Husband went to work. She dozed on and off for 3 months.  She forced fluids and original symptoms returned. No further diarrhea. No abdominal pain or nausea now. Her palpitations returned this morning, none now. No dizziness.  She went back to the ED last night with increase in amlodipine to 10 mg daily.  Home BP has improved to under 140/80 on several occasions since without palpitations recurring.  O:alert, NAD  HEENT: unremarkable  Chest: Clear  Heart: Regular  Abdomen: active BS, no HSM, nontender  Skin: No significant rash  Ext: No edema   A: blood pressure without symptoms, seems to be normalizing P: Continue the Norvasc 10 mg daily.  Try not to obsess about individual readings. Follow up with Dr. Perrin Maltese July 8th

## 2012-01-11 ENCOUNTER — Ambulatory Visit (INDEPENDENT_AMBULATORY_CARE_PROVIDER_SITE_OTHER): Payer: BC Managed Care – PPO | Admitting: Family Medicine

## 2012-01-11 VITALS — BP 184/77 | HR 102 | Temp 98.6°F | Resp 18 | Ht 64.5 in | Wt 157.0 lb

## 2012-01-11 DIAGNOSIS — I10 Essential (primary) hypertension: Secondary | ICD-10-CM

## 2012-01-11 NOTE — Progress Notes (Signed)
HPI: 61 yo woman being treated for hypertension.  Her readings have once again been high.  She has no persistent symptoms. No chest pain or headache.  She does have facial pain.  Some lightheadedness  She forced fluids and original symptoms returned. No further diarrhea. No abdominal pain or nausea now..  She went back to the ED last night with increase in amlodipine to 10 mg daily. Home BP has improved to under 140/80 on several occasions since without palpitations recurring.   BP readings Today:  140-150/50-80 until early afternoon when BP surged to 239/147, but it then settled to 164/73 within 15 minutes.   O:alert, NAD 138/72 right arm HEENT: unremarkable  Chest: Clear  Heart: Regular  Abdomen: active BS, no HSM, nontender  Skin: No significant rash  Ext: No edema   A: high blood pressure without symptoms.  Discrepancy between my readings and the patient's  P:   Return BP machine Continue current meds.

## 2012-01-13 ENCOUNTER — Ambulatory Visit (INDEPENDENT_AMBULATORY_CARE_PROVIDER_SITE_OTHER): Payer: BC Managed Care – PPO | Admitting: Emergency Medicine

## 2012-01-13 VITALS — BP 168/71 | HR 92 | Temp 98.0°F | Resp 16 | Ht 64.75 in | Wt 155.4 lb

## 2012-01-13 DIAGNOSIS — I10 Essential (primary) hypertension: Secondary | ICD-10-CM

## 2012-01-13 DIAGNOSIS — G459 Transient cerebral ischemic attack, unspecified: Secondary | ICD-10-CM

## 2012-01-13 MED ORDER — METOPROLOL SUCCINATE ER 25 MG PO TB24
25.0000 mg | ORAL_TABLET | Freq: Every day | ORAL | Status: DC
Start: 2012-01-13 — End: 2012-01-13

## 2012-01-13 MED ORDER — METOPROLOL SUCCINATE ER 25 MG PO TB24: 25.0000 mg | ORAL_TABLET | Freq: Every day | ORAL | Status: DC

## 2012-01-13 NOTE — Progress Notes (Signed)
Patient Name: Angelica Levine Date of Birth: 07-Apr-1951 Medical Record Number: [id number] Gender: female Date of Encounter: 01/13/2012  Chief Complaint: Hypertension   History of Present Illness:  Angelica Levine is a 61 y.o. very pleasant female patient who presents with the following:  Exhaustive history of concern regarding her blood pressure.  To the ER twice and this office three times this week for 'feeling bad' and having elevated pressure.  Said that at one time last night she had numbness and weakness in her right arm and numbness in hands and around her mouth but denies hyperventilating.  Says that she woke up with a rapid heart beat (98) and felt bad so she spent the rest of the night taking and retaking her blood pressure with the bulk of her readings less than 170/70.  Denies chest pain light headedness or dizziness or shortness of breath.  Denies other neurological or visual symptoms.    Patient Active Problem List  Diagnosis  . Hypothyroidism  . Allergic rhinitis  . DJD (degenerative joint disease)  . Lipids abnormal   Past Medical History  Diagnosis Date  . Hypertension   . Thyroid disease    Past Surgical History  Procedure Date  . Total knee arthroplasty    History  Substance Use Topics  . Smoking status: Never Smoker   . Smokeless tobacco: Not on file  . Alcohol Use: Yes     wine once in a while   No family history on file. Allergies  Allergen Reactions  . Shrimp (Shellfish Allergy) Anaphylaxis  . Asa (Aspirin) Other (See Comments)  . Eggs Or Egg-Derived Products   . Penicillins Hives  . Sudafed (Pseudoephedrine Hcl) Hypertension    Medication list has been reviewed and updated.  Current Outpatient Prescriptions on File Prior to Visit  Medication Sig Dispense Refill  . amLODipine (NORVASC) 5 MG tablet Take 2 tablets (10 mg total) by mouth daily.  30 tablet  3  . azelastine (ASTELIN) 137 MCG/SPRAY nasal spray Place 1 spray into the nose 2 (two)  times daily. Use in each nostril as directed      . cetirizine (ZYRTEC) 10 MG tablet Take 10 mg by mouth daily as needed.      . Cholecalciferol (VITAMIN D-3 PO) Take 1 tablet by mouth 3 (three) times a week.      . doxycycline (ORACEA) 40 MG capsule Take 40 mg by mouth every morning.      . fish oil-omega-3 fatty acids 1000 MG capsule Take 2 g by mouth daily.      . fluticasone (FLONASE) 50 MCG/ACT nasal spray Place 2 sprays into the nose daily.      Marland Kitchen levothyroxine (SYNTHROID, LEVOTHROID) 50 MCG tablet Take 50 mcg by mouth daily.      . metoprolol succinate (TOPROL-XL) 25 MG 24 hr tablet Take 1 tablet (25 mg total) by mouth daily.  30 tablet  3    Review of Systems:  As per HPI, otherwise negative.    Physical Examination: Filed Vitals:   01/13/12 1355  BP: 168/71  Pulse: 92  Temp: 98 F (36.7 C)  Resp: 16   Filed Vitals:   01/13/12 1355  Height: 5' 4.75" (1.645 m)  Weight: 155 lb 6.4 oz (70.489 kg)   Body mass index is 26.06 kg/(m^2). Ideal Body Weight: Weight in (lb) to have BMI = 25: 148.8   GEN: WDWN, NAD, Non-toxic, A & O x 3 HEENT: Atraumatic, Normocephalic. Neck supple. No  masses, No LAD. Ears and Nose: No external deformity. CV: RRR, No M/G/R. No JVD. No thrill. No extra heart sounds. PULM: CTA B, no wheezes, crackles, rhonchi. No retractions. No resp. distress. No accessory muscle use. ABD: S, NT, ND, +BS. No rebound. No HSM. EXTR: No c/c/e NEURO Normal gait.  PSYCH: Normally interactive. Conversant. Not depressed or anxious appearing.  Calm demeanor.    EKG / Labs / Xrays: None available at time of encounter  Assessment and Plan: Hypertension under marginal control Anxiety disorder Concern for possible TIA  Will obtain carotid duplex and add toprol xr 25  Carmelina Dane, MD

## 2012-01-13 NOTE — Patient Instructions (Addendum)
Transient Ischemic Attack A transient ischemic attack (TIA) is a "warning stroke" that causes stroke-like symptoms. Unlike a stroke, a TIA does not cause permanent damage to the brain. The symptoms of a TIA can happen very fast and do not last long. It is important to know the symptoms of a TIA and what to do. This can help prevent a major stroke or death. CAUSES   A TIA is caused by a temporary blockage in an artery in the brain or neck (carotid artery). The blockage does not allow the brain to get the blood supply it needs and can cause different symptoms. The blockage can be caused by either:   A blood clot.   Fatty buildup (plaque) in a neck or brain artery.  SYMPTOMS  TIA symptoms are the same as a stroke but are temporary. Symptoms can include sudden:  Numbness or weakness on one side of the body. Especially to the:   Face.   Arm.   Leg.   Trouble speaking, thinking, or confusion.   Change in vision, such as trouble seeing in one or both eyes.   Dizziness, loss of balance, or difficulty walking.   Severe headache.  ANY OF THESE SYMPTOMS MAY REPRESENT A SERIOUS PROBLEM THAT IS AN EMERGENCY. Do not wait to see if the symptoms will go away. Get medical help at once. Call your local emergency services (911 in [place] IMMEDIATELY. DO NOT drive yourself to the hospital. RISK FACTORS Risk factors can increase the risk of developing a TIA. These can include.   High blood pressure (hypertension).   High cholesterol (hyperlipidemia).   Heart disease (atherosclerosis).   Smoking.   Diabetes.   Abnormal heart rhythm (atrial fibrillation).   Family history of a stroke or heart attack.   Use of oral contraceptives (especially when combined with smoking).  DIAGNOSIS   A TIA can be diagnosed based on your:   Symptoms.   History.   Risk factors.   Tests that can help diagnose the symptoms of a TIA include:   CT or MRI scan. These tests can provide detailed images of the  brain.   Carotid ultrasound. This test looks to see if there are blockages in the carotid arteries of your neck.   Arteriography. A thin, small flexible tube (catheter) is inserted through a small cut (incision) in your groin. The catheter is threaded to your carotid or vertebral artery. A dye is then injected into the catheter. The dye highlights the arteries in your brain and allows your caregiver to look for narrowing or blockages that can cause a TIA.  TREATMENT  Based on the cause of a TIA, treatment options can vary. Treatment is important to help prevent a stroke. Treatment options can include:  Medication. Such as:   Clot-busting medicine.   Anti-platelet medicine.   Blood pressure medicine.   Blood thinner medicine.   Surgery:   Carotid endarterectomy. The carotid arteries are the arteries that supply the head and neck with oxygenated blood. This surgery can help remove fatty deposits (plaque) in the carotid arteries.   Angioplasty and stenting. This surgery uses a balloon to dilate a blocked artery in the brain. A stent is a small, metal mesh tube that can help keep an artery open  HOME CARE INSTRUCTIONS   It is important to take all medicine as told by your caregiver. If the medicine has side effects that affect you negatively, tell your caregiver right away. Do not stop taking medicine unless told   by your caregiver. Some medicines may need to be changed to better treat your condition.   Do not smoke. Talk to your caregiver on how to quit smoking.   Eat a diet high in fruits, vegetables and lean meat. Avoid a high fat, high salt diet. A dietician can you help you make healthy food choices.   Maintain a healthy weight. Develop an exercise plan approved by your caregiver.  SEEK IMMEDIATE MEDICAL CARE IF:   You develop weakness or numbness on one side of your body.   You have problems thinking, speaking, or feel confused.   You have vision changes.   You feel dizzy,  have trouble walking, or lose your balance.   You develop a severe headache.  MAKE SURE YOU:   Understand these instructions.   Will watch your condition.   Will get help right away if you are not doing well or get worse.  Document Released: 04/11/2005 Document Revised: 06/21/2011 Document Reviewed: 08/25/2009 [facility] Patient Information 2012 [place], [place].Hypertension As your heart beats, it forces blood through your arteries. This force is your blood pressure. If the pressure is too high, it is called hypertension (HTN) or high blood pressure. HTN is dangerous because you may have it and not know it. High blood pressure may mean that your heart has to work harder to pump blood. Your arteries may be narrow or stiff. The extra work puts you at risk for heart disease, stroke, and other problems.  Blood pressure consists of two numbers, a higher number over a lower, 110/72, for example. It is stated as "110 over 72." The ideal is below 120 for the top number (systolic) and under 80 for the bottom (diastolic). Write down your blood pressure today. You should pay close attention to your blood pressure if you have certain conditions such as:  Heart failure.   Prior heart attack.   Diabetes   Chronic kidney disease.   Prior stroke.   Multiple risk factors for heart disease.  To see if you have HTN, your blood pressure should be measured while you are seated with your arm held at the level of the heart. It should be measured at least twice. A one-time elevated blood pressure reading (especially in the Emergency Department) does not mean that you need treatment. There may be conditions in which the blood pressure is different between your right and left arms. It is important to see your caregiver soon for a recheck. Most people have essential hypertension which means that there is not a specific cause. This type of high blood pressure may be lowered by changing lifestyle factors such  as:  Stress.   Smoking.   Lack of exercise.   Excessive weight.   Drug/tobacco/alcohol use.   Eating less salt.  Most people do not have symptoms from high blood pressure until it has caused damage to the body. Effective treatment can often prevent, delay or reduce that damage. TREATMENT  When a cause has been identified, treatment for high blood pressure is directed at the cause. There are a large number of medications to treat HTN. These fall into several categories, and your caregiver will help you select the medicines that are best for you. Medications may have side effects. You should review side effects with your caregiver. If your blood pressure stays high after you have made lifestyle changes or started on medicines,   Your medication(s) may need to be changed.   Other problems may need to be addressed.  Be certain you understand your prescriptions, and know how and when to take your medicine.   Be sure to follow up with your caregiver within the time frame advised (usually within two weeks) to have your blood pressure rechecked and to review your medications.   If you are taking more than one medicine to lower your blood pressure, make sure you know how and at what times they should be taken. Taking two medicines at the same time can result in blood pressure that is too low.  SEEK IMMEDIATE MEDICAL CARE IF:  You develop a severe headache, blurred or changing vision, or confusion.   You have unusual weakness or numbness, or a faint feeling.   You have severe chest or abdominal pain, vomiting, or breathing problems.  MAKE SURE YOU:   Understand these instructions.   Will watch your condition.   Will get help right away if you are not doing well or get worse.  Document Released: 07/02/2005 Document Revised: 06/21/2011 Document Reviewed: 02/20/2008 [facility] Patient Information 2012 [place], [place].

## 2012-01-14 ENCOUNTER — Ambulatory Visit
Admission: RE | Admit: 2012-01-14 | Discharge: 2012-01-14 | Disposition: A | Discharge status: Home / Self Care | Payer: BC Managed Care – PPO | Source: Ambulatory Visit | Attending: Emergency Medicine | Admitting: Emergency Medicine

## 2012-01-14 DIAGNOSIS — G459 Transient cerebral ischemic attack, unspecified: Secondary | ICD-10-CM

## 2012-01-16 ENCOUNTER — Encounter: Payer: Self-pay | Admitting: Emergency Medicine

## 2012-01-21 ENCOUNTER — Encounter: Payer: Self-pay | Admitting: Internal Medicine

## 2012-01-21 ENCOUNTER — Ambulatory Visit (INDEPENDENT_AMBULATORY_CARE_PROVIDER_SITE_OTHER): Payer: BC Managed Care – PPO | Admitting: Internal Medicine

## 2012-01-21 ENCOUNTER — Telehealth: Payer: Self-pay

## 2012-01-21 VITALS — BP 121/79 | HR 65 | Temp 98.0°F | Resp 16 | Ht 64.25 in | Wt 154.6 lb

## 2012-01-21 DIAGNOSIS — Z79899 Other long term (current) drug therapy: Secondary | ICD-10-CM

## 2012-01-21 DIAGNOSIS — Z0000 Encounter for general adult medical examination without abnormal findings: Secondary | ICD-10-CM

## 2012-01-21 DIAGNOSIS — E039 Hypothyroidism, unspecified: Secondary | ICD-10-CM

## 2012-01-21 DIAGNOSIS — IMO0001 Reserved for inherently not codable concepts without codable children: Secondary | ICD-10-CM

## 2012-01-21 DIAGNOSIS — I10 Essential (primary) hypertension: Secondary | ICD-10-CM

## 2012-01-21 LAB — POCT URINALYSIS DIPSTICK
Ketones, UA: NEGATIVE
Leukocytes, UA: NEGATIVE
Nitrite, UA: NEGATIVE
Protein, UA: NEGATIVE
Urobilinogen, UA: 0.2
pH, UA: 6

## 2012-01-21 LAB — CBC WITH DIFFERENTIAL/PLATELET
Basophils Absolute: 0 10*3/uL (ref 0.0–0.1)
Eosinophils Absolute: 0 10*3/uL (ref 0.0–0.7)
Eosinophils Relative: 0 % (ref 0–5)
Lymphocytes Relative: 23 % (ref 12–46)
MCH: 28.9 pg (ref 26.0–34.0)
MCV: 85.8 fL (ref 78.0–100.0)
Neutrophils Relative %: 70 % (ref 43–77)
Platelets: 278 10*3/uL (ref 150–400)
RBC: 5.08 MIL/uL (ref 3.87–5.11)
RDW: 13.8 % (ref 11.5–15.5)
WBC: 6.6 10*3/uL (ref 4.0–10.5)

## 2012-01-21 LAB — COMPREHENSIVE METABOLIC PANEL
ALT: 14 U/L (ref 0–35)
AST: 18 U/L (ref 0–37)
Albumin: 4.5 g/dL (ref 3.5–5.2)
Alkaline Phosphatase: 83 U/L (ref 39–117)
BUN: 13 mg/dL (ref 6–23)
Calcium: 9.7 mg/dL (ref 8.4–10.5)
Chloride: 103 mEq/L (ref 96–112)
Potassium: 4 mEq/L (ref 3.5–5.3)
Sodium: 139 mEq/L (ref 135–145)

## 2012-01-21 LAB — LIPID PANEL
LDL Cholesterol: 132 mg/dL — ABNORMAL HIGH (ref 0–99)
Triglycerides: 125 mg/dL (ref <150)
VLDL: 25 mg/dL (ref 0–40)

## 2012-01-21 LAB — POCT UA - MICROSCOPIC ONLY
Casts, Ur, LPF, POC: NEGATIVE
WBC, Ur, HPF, POC: NEGATIVE
Yeast, UA: NEGATIVE

## 2012-01-21 LAB — VITAMIN B12: Vitamin B-12: 389 pg/mL (ref 211–911)

## 2012-01-21 MED ORDER — AMLODIPINE BESYLATE 5 MG PO TABS: 10.0000 mg | ORAL_TABLET | Freq: Every day | ORAL | Status: DC

## 2012-01-21 NOTE — Progress Notes (Signed)
  Subjective:    Patient ID: Angelica Levine, female    DOB: [birth year], 61 y.o.   MRN: [id number]  HPI Recent visits to er for scary htn. All tests neg and amlodipine 10mg  and metoprolol 25mg  Started. Doing well and home bps are great. See scanned hx for cpe   Review of Systems See scanned ros    Objective:   Physical Exam  Constitutional: She is oriented to person, place, and time. She appears well-developed and well-nourished.  HENT:  Right Ear: External ear normal.  Left Ear: External ear normal.  Nose: Nose normal.  Mouth/Throat: Oropharynx is clear and moist.  Eyes: EOM are normal. Pupils are equal, round, and reactive to light. No scleral icterus.  Neck: Normal range of motion. Neck supple. No tracheal deviation present. No thyromegaly present.  Cardiovascular: Normal rate, regular rhythm, normal heart sounds and intact distal pulses.   Pulmonary/Chest: Effort normal and breath sounds normal. She exhibits no tenderness.  Abdominal: Soft. Bowel sounds are normal. She exhibits no distension and no mass. There is no tenderness.  Genitourinary: No breast swelling, tenderness, discharge or bleeding. Pelvic exam was performed with patient supine.  Musculoskeletal: Normal range of motion.       Has stable atrtificial r knee.  Lymphadenopathy:    She has no cervical adenopathy.  Neurological: She is alert and oriented to person, place, and time. She has normal reflexes. No cranial nerve deficit. She exhibits normal muscle tone. Coordination normal.  Skin: Skin is warm and dry.  Psychiatric: She has a normal mood and affect. Her behavior is normal. Judgment and thought content normal.   ekg nl 6/24  labs     Assessment & Plan:  Needs mammogram and colonoscopy and she will schedule RF meds 1 year

## 2012-01-21 NOTE — Telephone Encounter (Signed)
PT STATES SHE HAD JUST SEEN DR GUEST AND FORGOT TO ASK IF SHE CAN TAKE HER FISH OIL,FLONASE ANS ASTELIN ALONG WITH THE MEDICINE SHE WAS GIVEN PLEASE CALL [phone]

## 2012-01-21 NOTE — Telephone Encounter (Signed)
Patient states she saw Dr. Dareen Piano 6/30 and went sent for a test on 7/1. Patient has PE with Dr. Perrin Maltese today and he didn't know anything about test from 7/1 or results. Patient would like results or to know status of test. Please call [phone] or [phone] with results.

## 2012-01-22 ENCOUNTER — Encounter: Payer: Self-pay | Admitting: Radiology

## 2012-01-22 NOTE — Telephone Encounter (Signed)
Yes, that is fine. 

## 2012-01-22 NOTE — Telephone Encounter (Signed)
Pt.notified

## 2012-01-29 ENCOUNTER — Encounter: Payer: Self-pay | Admitting: Physician Assistant

## 2012-01-29 ENCOUNTER — Ambulatory Visit (INDEPENDENT_AMBULATORY_CARE_PROVIDER_SITE_OTHER): Payer: BC Managed Care – PPO | Admitting: Physician Assistant

## 2012-01-29 VITALS — BP 120/70 | HR 75 | Temp 98.1°F | Resp 18 | Ht 65.0 in | Wt 154.0 lb

## 2012-01-29 DIAGNOSIS — I10 Essential (primary) hypertension: Secondary | ICD-10-CM

## 2012-01-29 NOTE — Progress Notes (Signed)
  Subjective:    Patient ID: Angelica Levine, female    DOB: June 08, 1951, 61 y.o.   MRN: [id number]  HPI Pt presents to clinic for HTN recheck.  She was seen 1 wk  ago for her CPE and her BP was good then but 4 days ago she started to feel bad and her BP has been high since then.  She has a very detailed list of her BP readings over the last 4 days and 8-10 readings each day and how she felt.  She has been tired a lot and noticing that she is waking up in the middle of the night feeling bad and her BP has been 170/90s, pulse 70-80s avg at night. Her cuff has been tested and is comparable to our manual readings.  She is very worried about her BP and the fact that she feels bad and cannot sleep because her BP is high.  She states she has been under no more stress than normal.  When she feels bad she starts to relax by laying down and listening to music and rarely is able to decreased her BP.  She has had an increase intake in her salt over the last 2 days and did notice some LE swelling yesterday and wonders if that is the cause.  She has tried to increase her water intake.  She has not noticed any problems with her fatigue until her BP was high - did not seem related to toprol start.  I spent about 45 mins with patient going over and over her readings.   Review of Systems  Respiratory: Negative for cough, chest tightness and shortness of breath.   Cardiovascular: Negative for chest pain.  Neurological: Positive for weakness.       Objective:   Physical Exam  Vitals reviewed. Constitutional: She appears well-developed and well-nourished.  HENT:  Head: Normocephalic and atraumatic.  Right Ear: External ear normal.  Left Ear: External ear normal.  Nose: Nose normal.  Eyes: Conjunctivae are normal.  Neck: Normal range of motion.  Cardiovascular: Normal rate, regular rhythm and normal heart sounds.   Pulmonary/Chest: Effort normal and breath sounds normal.   No LE edema.        Assessment  & Plan:  HTN - for now pt should continue her Norvasc 10mg  and Toprol XL 25mg  and for the next week really monitor salt intake and see if by decreasing her salt intake she has better control of her BP, if over the next week patients BP readings and her symptoms have not improved will consider medication change from toprol to lisinopril.  I am worried that she is sensitive enough to her BP being high that she may not be able to wait the full time before her Toprol gives full effect and am worried increasing the toprol will not give [place] the needed results and cause her more fatigue.  Pt agreed and understood the above.  Pt will call and if readings are still high we can start Lisinopril 10mg  and then she will recheck 1 wk after that.

## 2012-01-30 ENCOUNTER — Telehealth: Payer: Self-pay

## 2012-01-30 MED ORDER — LISINOPRIL 10 MG PO TABS: 10.0000 mg | ORAL_TABLET | Freq: Every day | ORAL | Status: DC

## 2012-01-30 NOTE — Telephone Encounter (Signed)
Pt reports that her BP was 177/87 at 12:41 AM last night. Then she took her Toprol at 7:45 am and rechecked BP at 8:00 and it was down to 143/87. Pt laid down and fell asleep for about 2 hours later in morn and woke up at 12:00 pm w/"funny feeling in the back of her head and knew that her BP was high". That is when she checked it and it was 167/78. Please advise.

## 2012-01-30 NOTE — Telephone Encounter (Signed)
PT STATES SARAH WANTED HER TO CALL IF HER BP WENT UP AND IT HAS BEEN SKY HIGH. AT 12:00 IT WAS 167/78 AND REALLY HIGH LAST NIGHT PLEASE CALL [phone] AND YOU HAVE TO DIAL THE 336

## 2012-01-30 NOTE — Telephone Encounter (Signed)
I called her about this and also spoke to her husband per her request, she was advised to stop the Toprol and begin the Lisinopril and then call back in 1 week to let us know how her BP is on new meds. She wanted them sent to [facility] of [facility] so I sent the Lisinopril to [facility] and called [facility] to cancel

## 2012-01-30 NOTE — Telephone Encounter (Signed)
Please stop Toprol and start Lisinopril which I sent to the pharmacy.  Lets have her take this for 1 wk continue to check her BP and then recheck with [place].

## 2012-02-04 ENCOUNTER — Ambulatory Visit: Payer: BC Managed Care – PPO

## 2012-02-04 ENCOUNTER — Ambulatory Visit (INDEPENDENT_AMBULATORY_CARE_PROVIDER_SITE_OTHER): Payer: BC Managed Care – PPO | Admitting: Family Medicine

## 2012-02-04 VITALS — BP 152/76 | HR 77 | Temp 97.7°F | Resp 16 | Ht 64.5 in | Wt 152.0 lb

## 2012-02-04 DIAGNOSIS — I10 Essential (primary) hypertension: Secondary | ICD-10-CM

## 2012-02-04 DIAGNOSIS — R0602 Shortness of breath: Secondary | ICD-10-CM

## 2012-02-04 LAB — D-DIMER, QUANTITATIVE: D-Dimer, Quant: 0.43 ug/mL-FEU (ref 0.00–0.48)

## 2012-02-04 NOTE — Progress Notes (Signed)
[facility] [address], [place] [address] [phone]  Date:  02/04/2012   Name:  Angelica Levine   DOB:  [birth year]   MRN:  [id number]  PCP:  Tally Due, MD    Chief Complaint: Palpitations and Fatigue   History of Present Illness:  Angelica Levine is a 61 y.o. very pleasant female patient who presents with the following: She has been seen a couple of times this month and several times last month, see OV notes.  On 01/21/12 she was seen for follow- up from hospital evaluation for HTN.  She later called back due to persistently elevated BP- this past Thursday she was told to stop her toprol and start lisinopril 10mg .  She has continued on 10 mg of norvasc.    Today is Monday.  On Saturday she noted that she had a leg cramp in her right leg.  Early Sunday morning she woke up and noted "all the blood was rushing to my head."  This morning she noted that her neck hurt and "all the blood was rushing to my head" again.  She notes a "drawing in my forehead."  "I have a very high threshold for pain."  It seems that she has actually had this "rushing to her head" feeling intermittently for about a month or even longer.    She currently feels "exhausted" and notes a "muscle spasm in my forehead."  On Saturday she noted some difficulty taking a deep breath.  She still does not feel that she can get a deep breath.  She does not note any CP.  She has not noted wheezing, cough or fever.    She states that she has had a few episodes like this over her life, but she was only diagnosed with HTN and started tx for same last month.  Normal TSH 01/21/12.    She denies feeling under any particular stress, and does not feel that she has anxiety.   Patient Active Problem List  Diagnosis  . Hypothyroidism  . Allergic rhinitis  . DJD (degenerative joint disease)  . Lipids abnormal  . HTN (hypertension)    Past Medical History  Diagnosis Date  . Hypertension   . Thyroid  disease     Past Surgical History  Procedure Date  . Total knee arthroplasty     History  Substance Use Topics  . Smoking status: Never Smoker   . Smokeless tobacco: Not on file  . Alcohol Use: Yes     wine once in a while    No family history on file.  Allergies  Allergen Reactions  . Shrimp (Shellfish Allergy) Anaphylaxis  . Asa (Aspirin) Other (See Comments)  . Eggs Or Egg-Derived Products   . Penicillins Hives  . Sudafed (Pseudoephedrine Hcl) Hypertension    Medication list has been reviewed and updated.  Current Outpatient Prescriptions on File Prior to Visit  Medication Sig Dispense Refill  . amLODipine (NORVASC) 10 MG tablet Take 10 mg by mouth daily.      . cetirizine (ZYRTEC) 10 MG tablet Take 10 mg by mouth daily as needed.      . Cholecalciferol (VITAMIN D-3 PO) Take 1 tablet by mouth 3 (three) times a week.      . doxycycline (ORACEA) 40 MG capsule Take 40 mg by mouth every morning.      . fish oil-omega-3 fatty acids 1000 MG capsule Take 2 g by mouth daily.      Marland Kitchen  fluticasone (FLONASE) 50 MCG/ACT nasal spray Place 2 sprays into the nose daily.      Marland Kitchen levothyroxine (SYNTHROID, LEVOTHROID) 50 MCG tablet Take 50 mcg by mouth daily.      Marland Kitchen lisinopril (PRINIVIL,ZESTRIL) 10 MG tablet Take 1 tablet (10 mg total) by mouth daily.  30 tablet  0  . azelastine (ASTELIN) 137 MCG/SPRAY nasal spray Place 1 spray into the nose 2 (two) times daily. Use in each nostril as directed      . metoprolol succinate (TOPROL-XL) 25 MG 24 hr tablet Take 1 tablet (25 mg total) by mouth daily.  30 tablet  3    Review of Systems:  As per HPI- otherwise negative.   Physical Examination: Filed Vitals:   02/04/12 1023  BP: 152/76  Pulse: 77  Temp: 97.7 F (36.5 C)  Resp: 16   Filed Vitals:   02/04/12 1023  Height: 5' 4.5" (1.638 m)  Weight: 152 lb (68.947 kg)   Body mass index is 25.69 kg/(m^2). Ideal Body Weight: Weight in (lb) to have BMI = 25: 147.6   GEN: WDWN, NAD,  Non-toxic, A & O x 3   HEENT: Atraumatic, Normocephalic. Neck supple. No masses, No LAD.  TM and oropharynx wnl, PEERl, EOMI,  There is no facial drooping or numbness.   Ears and Nose: No external deformity. CV: RRR, No M/G/R. No JVD. No thrill. No extra heart sounds. PULM: CTA B, no wheezes, crackles, rhonchi. No retractions. No resp. distress. No accessory muscle use. ABD: S, NT, ND, +BS. No rebound. No HSM. EXTR: No c/c/e NEURO Normal gait.  PSYCH: Normally interactive. Conversant. Not depressed or anxious appearing.  Calm demeanor.   [facility] reading (PRIMARY) by  Dr. Patsy Lager.  CXR wnl  CHEST - 2 VIEW  Comparison: 01/07/2012.  Findings: There is stable normal appearance of the cardiac silhouette. There is slight ectasia of thoracic aorta. Mediastinal and hilar contours appear stable. No pleural abnormality is evident.  No pulmonary infiltrates are evident. Calcified pulmonary granuloma in the left midlung on the PA image is unchanged. There is no evidence of active granulomatous infection.  Osteophytes are seen in the spine. There is somewhat osteopenic appearance of bones.  IMPRESSION: No acute or active cardiopulmonary or pleural abnormalities are evident. Stable chronic findings detailed above.  EKG: NSR, no ST elevation.  There is minimal (less than 1mm) ST depression in V3 and V4.    Assessment and Plan: 1. HTN (hypertension)  EKG 12-Lead  2. SOB (shortness of breath)  DG Chest 2 View, D-dimer, quantitative   Railee has had numerous visits with [place] and also twice at the ED in the last 2 months due to concerns regarding her BP.   Explained that I am not sure what to make of her feeling of all the blood rushing to her head.  As she has had these unusual symptoms and now has some SOB and had some right calf pain, we will do a D Dimer today.   Will also refer to cardiology for their input regarding her HTN and possible stress test.  If her evaluation is normal will need to  consider anxiety as the cause of her symptoms.  Encouraged her to check her BP much less often- she is still checking it 4 or 5 times a day.    Abbe Amsterdam, MD

## 2012-02-06 ENCOUNTER — Telehealth: Payer: Self-pay

## 2012-02-06 MED ORDER — LOSARTAN POTASSIUM 100 MG PO TABS: 100.0000 mg | ORAL_TABLET | Freq: Every day | ORAL | Status: DC

## 2012-02-06 NOTE — Telephone Encounter (Signed)
See notes under previous phone message. 

## 2012-02-06 NOTE — Telephone Encounter (Signed)
PT STATES SHE HAD SPOKEN WITH BARBARA EARLIER AND DIDN'T KNOW IF SHE HAD TO COME IN PLEASE CALL [phone]

## 2012-02-06 NOTE — Telephone Encounter (Signed)
Explained new Rx to pt and instr's from [facility]. Also advised pt to cont her diary of her BPs. Pt agreed and reports that her breathing is still fine now and coughing has improved.

## 2012-02-06 NOTE — Telephone Encounter (Signed)
Pt is having trouble breathing and was told to contact Dr Patsy Lager if things got worse, and pt started coughing and not sure if she should take her lisinopril. [phone]

## 2012-02-06 NOTE — Telephone Encounter (Signed)
D/C lisinopril. Start new Rx I'm sending now.  Check BPs to ensure they are improving.  If any further breathing problems, go to hospital. If cough persists or worsens, RTC.

## 2012-02-06 NOTE — Telephone Encounter (Signed)
Called pt and she reported that she was unable to get a deep breath last night, it finally improved and she was able to sleep and is better today but still not normal. She stated that she had this problem on Monday also but not as bad. In addition she started coughing occasionally (dry cough w/no chest congestion that she knows of). Her BP has been good - actually down to normal at times on the Lisinopril, but didn't know whether she should continue to take it since these Sxs developed. She has not taken today and would like CB w/directions.

## 2012-02-08 ENCOUNTER — Telehealth: Payer: Self-pay

## 2012-02-08 NOTE — Telephone Encounter (Signed)
PT WAS PUT ON A DIFFERENT MEDICINE AND WOULD LIKE TO SPEAK WITH SOMEONE ABOUT IT. PLEASE CALL [phone]

## 2012-02-08 NOTE — Telephone Encounter (Signed)
Try taking 1/2 tablet daily for the next week and lets see how that does.

## 2012-02-08 NOTE — Telephone Encounter (Signed)
Was put on cozaar to take at 10 pm every night since Wednesday but her blood pressure got down to 95/63 today. It is now back up to 140/73. She is wondering if she should continue taking the full amount or if she should decrease her mg intake.  [phone]

## 2012-02-09 ENCOUNTER — Ambulatory Visit (INDEPENDENT_AMBULATORY_CARE_PROVIDER_SITE_OTHER): Payer: BC Managed Care – PPO | Admitting: Family Medicine

## 2012-02-09 VITALS — BP 130/66 | HR 84 | Temp 98.3°F | Resp 20 | Ht 64.5 in | Wt 152.0 lb

## 2012-02-09 DIAGNOSIS — I10 Essential (primary) hypertension: Secondary | ICD-10-CM

## 2012-02-09 NOTE — Telephone Encounter (Signed)
LMOM TO CB 

## 2012-02-09 NOTE — Patient Instructions (Signed)
Return if excessive symptoms  Do not be terribly concerned about the lower number(diastolic) unless it is running above 90.  If the upper number (systolic) is running consistently below 100, then we should cut back on your medication, probably the amlodipine dose.

## 2012-02-09 NOTE — Progress Notes (Signed)
Subjective: Patient has been having a lot of trouble with her blood pressure. It has fluctuated a lot. She has occasional palpitation. She just hasn't been feeling well. Several times her medications have been adjusted lately. She is currently taking the amlodipine 10 mg in the evening and Cozaar 100 mg. She has been monitoring her blood pressure and is very considerably. The systolic has gone up into the mildly elevated 150s type arrange, and the diastolic is usually in the 60s. At times the systolic has been down in the low 100 range. She does have an appointment with the cardiologist in a couple weeks.  Objective: Pleasant alert healthy appearing lady in no major distress at this time. Her neck is supple. Chest is clear to auscultation. Heart regular without murmur gallop or arrhythmias.  Assessment: Hypertension, variable control  Plan: I think it is best to just leave her medicines the same without making another change, and triangular couple of weeks of stability on these doses, even if control is not perfect, until she sees the cardiologist. If however it is getting too low she is having recurrent lightheaded spells she is to let us know.

## 2012-02-10 NOTE — Telephone Encounter (Signed)
TRIED CALL 3 TIMES BUT PHONE WOULD RING ONCE AND HANG UP

## 2012-02-11 NOTE — Telephone Encounter (Signed)
Called and left message again for her to call

## 2012-02-11 NOTE — Telephone Encounter (Signed)
Pt returning Amy L phone call please cnt pt [phone]

## 2012-02-11 NOTE — Telephone Encounter (Signed)
Patient is having trouble with her land line.  Please call her cell [phone].

## 2012-02-12 LAB — IFOBT (OCCULT BLOOD): IFOBT: POSITIVE

## 2012-02-12 NOTE — Telephone Encounter (Signed)
I spoke to her, she was seen on Saturday and was seen by Dr Alwyn Ren he advised her to stay with her current dosage/ she is doing better, feels like she is adjusting better to it.

## 2012-02-14 ENCOUNTER — Observation Stay (HOSPITAL_COMMUNITY)
Admission: EM | Admit: 2012-02-14 | Discharge: 2012-02-15 | Disposition: A | Discharge status: Home / Self Care | Payer: BC Managed Care – PPO | Attending: Emergency Medicine | Admitting: Emergency Medicine

## 2012-02-14 ENCOUNTER — Other Ambulatory Visit: Payer: Self-pay

## 2012-02-14 ENCOUNTER — Encounter (HOSPITAL_COMMUNITY): Payer: Self-pay | Admitting: Adult Health

## 2012-02-14 DIAGNOSIS — R002 Palpitations: Secondary | ICD-10-CM | POA: Insufficient documentation

## 2012-02-14 DIAGNOSIS — I10 Essential (primary) hypertension: Secondary | ICD-10-CM | POA: Insufficient documentation

## 2012-02-14 DIAGNOSIS — E079 Disorder of thyroid, unspecified: Secondary | ICD-10-CM | POA: Insufficient documentation

## 2012-02-14 DIAGNOSIS — Z79899 Other long term (current) drug therapy: Secondary | ICD-10-CM | POA: Insufficient documentation

## 2012-02-14 DIAGNOSIS — R0602 Shortness of breath: Principal | ICD-10-CM | POA: Insufficient documentation

## 2012-02-14 NOTE — ED Notes (Signed)
Pt reports SOB and chest soreness that began today. Able to speak in full sentences, sats 97%. Had a similar reaction while taking lisinopril and recently switched to Cozaar last week.

## 2012-02-15 ENCOUNTER — Ambulatory Visit (INDEPENDENT_AMBULATORY_CARE_PROVIDER_SITE_OTHER): Payer: BC Managed Care – PPO | Admitting: Family Medicine

## 2012-02-15 ENCOUNTER — Emergency Department (HOSPITAL_COMMUNITY): Payer: BC Managed Care – PPO

## 2012-02-15 VITALS — BP 137/64 | HR 87 | Temp 97.7°F | Resp 16 | Ht 64.5 in | Wt 151.0 lb

## 2012-02-15 DIAGNOSIS — R0602 Shortness of breath: Secondary | ICD-10-CM

## 2012-02-15 LAB — CBC
MCH: 28.5 pg (ref 26.0–34.0)
MCHC: 33.4 g/dL (ref 30.0–36.0)
Platelets: 266 10*3/uL (ref 150–400)

## 2012-02-15 LAB — POCT I-STAT TROPONIN I
Troponin i, poc: 0 ng/mL (ref 0.00–0.08)
Troponin i, poc: 0 ng/mL (ref 0.00–0.08)

## 2012-02-15 LAB — BASIC METABOLIC PANEL
Calcium: 9.7 mg/dL (ref 8.4–10.5)
GFR calc Af Amer: >90 mL/min (ref >90)
GFR calc non Af Amer: >90 mL/min (ref >90)
Potassium: 3.7 mEq/L (ref 3.5–5.1)
Sodium: 137 mEq/L (ref 135–145)

## 2012-02-15 NOTE — ED Notes (Signed)
Pt. Returned from xray 

## 2012-02-15 NOTE — ED Provider Notes (Signed)
61 y/o female in CDU on CPP. Patient is resting comfortably in bed. Admits to a few second episode SOB about 1 hour ago, but states it is not even close to as bad as it was when she came into ED. She is asymptomatic at this time. Denies any current chest pain, SOB, dizziness, lightheadedness, nausea. On PE- patient resting comfortably in NAD. Heart RRR. Chest CTA A&P b/l. Normal respiratory effort. Extremities without edema. Distal pulses intact. AAOx3. Awaiting stress echo this morning.  10:15 AM Stress echo negative. Patients s/s have greatly improved since arrival in ED. Has very mild SOB. Denies any chest pain. Will check BP, give food, and reassess. If no s/s present, will d/c home.   11:03 AM Blood pressure 130/73. Patient denies any s/s. She feels comfortable for discharge. Has f/u appt scheduled with [facility] cardiology later this month. She will be seeing her PCP later today. Hold on cozaar until f/u with PCP.   Trevor Mace, PA-C 02/15/12 1104

## 2012-02-15 NOTE — ED Notes (Signed)
MD at bedside. 

## 2012-02-15 NOTE — Patient Instructions (Addendum)
Do NOT take lisinopril (an ACE inhibitor) or COZAAR (an ARB)   Arrival date & time 02/14/12  2342    First MD Initiated Contact with Patient 02/15/12 [phone]         Chief Complaint   Patient presents with   .  Shortness of Breath        (Consider location/radiation/quality/duration/timing/severity/associated sxs/prior treatment) HPI Comments: Pt recently started on antihypertensive medications for BP in the 170's and was having some associated palpitations.  Was seen at [facility] July 1st and had Toprol XL added to the Norvasc that she started 6/24.  Was doing OK for a few days and then had several days of hypertension to the 170's.  Was seen again at [facility] and PA at that time she was feeling exhausted, was changed to Lisinopril 10mg  in place of Metoprolol which was 2 weeks ago.  Was on that for 6 days and started getting some SOB - became worse at night when felt very heavy in the chest.  Stopped ACE i 9 days ago and switched to Cozaar.  Week ago -  states "not feeling good" - feels dizzy and BP was 166 systolic / ?.  Next AM systolic was 100 and was feeling fatigued.  PMD called her and told her to 1/2 the dose of the Cozaar.  She didn't do this but instead took her 2 BP meds norvasc and Cozaar a further distance apart.  This seemed to help but today had some increased SOB, and had some heaviness in the chest as well.  This lasted for several hours.  Denies f/c/n/v.  No coughing, swelling, new rashes, no diarrhea, dysuria, back pain, abd pain.  Now has ongoing mild heaviness but has improved.    Patient is a 61 y.o. female presenting with shortness of breath. The history is provided by the patient and medical records.  Shortness of Breath   Associated symptoms include shortness of breath.       Past Medical History   Diagnosis  Date   .  Hypertension     .  Thyroid disease           Past Surgical History   Procedure  Date   .  Total knee arthroplasty          History reviewed.  No pertinent family history.    History   Substance Use Topics   .  Smoking status:  Never Smoker    .  Smokeless tobacco:  Not on file   .  Alcohol Use:  Yes         wine once in a while         OB History      Grav  Para  Term  Preterm  Abortions  TAB  SAB  Ect  Mult  Living                                  Review of Systems  Respiratory: Positive for shortness of breath.   All other systems reviewed and are negative.      Allergies    Shrimp; Asa; Eggs or egg-derived products; Lisinopril; Penicillins; and Sudafed    Home Medications       Current Outpatient Rx   Name  Route  Sig  Dispense  Refill   .  AMLODIPINE BESYLATE 10 MG PO TABS  Oral  Take 10 mg by mouth daily.       Marland Kitchen  AZELASTINE HCL 137 MCG/SPRAY NA SOLN  Nasal  Place 1 spray into the nose 2 (two) times daily. Use in each nostril as directed       .  CETIRIZINE HCL 10 MG PO TABS  Oral  Take 10 mg by mouth daily as needed.       Marland Kitchen  VITAMIN D-3 PO  Oral  Take 1 tablet by mouth 3 (three) times a week.       Marland Kitchen  DOXYCYCLINE (ROSACEA) 40 MG PO CPDR  Oral  Take 40 mg by mouth every morning.       Marland Kitchen  OMEGA-3 FATTY ACIDS 1000 MG PO CAPS  Oral  Take 2 g by mouth daily.       Marland Kitchen  FLUTICASONE PROPIONATE 50 MCG/ACT NA SUSP  Nasal  Place 2 sprays into the nose daily.       Marland Kitchen  LEVOTHYROXINE SODIUM 50 MCG PO TABS  Oral  Take 50 mcg by mouth daily.       Marland Kitchen  LISINOPRIL 10 MG PO TABS  Oral  Take 1 tablet (10 mg total) by mouth daily.  30 tablet  0   .  LOSARTAN POTASSIUM 100 MG PO TABS  Oral  Take 1 tablet (100 mg total) by mouth daily.  30 tablet  1   .  METOPROLOL SUCCINATE ER 25 MG PO TB24  Oral  Take 1 tablet (25 mg total) by mouth daily.  30 tablet  3        BP 181/71  Pulse 76  Temp 97.6 F (36.4 C) (Oral)  Resp 16  SpO2 100%   Physical Exam  Nursing note and vitals reviewed. Constitutional: She appears well-developed and well-nourished. No distress.  HENT:   Head: Normocephalic and atraumatic.     Mouth/Throat: Oropharynx is clear and moist. No oropharyngeal exudate.  Eyes: Conjunctivae and EOM are normal. Pupils are equal, round, and reactive to light. Right eye exhibits no discharge. Left eye exhibits no discharge. No scleral icterus.  Neck: Normal range of motion. Neck supple. No JVD present. No thyromegaly present.  Cardiovascular: Normal rate, regular rhythm, normal heart sounds and intact distal pulses.  Exam reveals no gallop and no friction rub.    No murmur heard. Pulmonary/Chest: Effort normal and breath sounds normal. No respiratory distress. She has no wheezes. She has no rales.  Abdominal: Soft. Bowel sounds are normal. She exhibits no distension and no mass. There is no tenderness.  Musculoskeletal: Normal range of motion. She exhibits no edema and no tenderness.  Lymphadenopathy:    She has no cervical adenopathy.  Neurological: She is alert. Coordination normal.  Skin: Skin is warm and dry. No rash noted. No erythema.  Psychiatric: She has a normal mood and affect. Her behavior is normal.       ED Course    Procedures (including critical care time)     Labs Reviewed   CBC   BASIC METABOLIC PANEL      No results found.     No diagnosis found.        MDM    Exam is normal, no signs of rales, arrhythmia or swelling, ECG   ED ECG REPORT  I personally interpreted this EKG    Date: 02/15/2012    Rate: 77  Rhythm: normal sinus rhythm  QRS Axis: normal  Intervals: normal  ST/T Wave abnormalities: normal  Conduction Disutrbances:none  Narrative Interpretation:    Old EKG Reviewed: Compared with 01/07/2012, no  significant changes   Patient is low risk for myocardial infarction, has initial negative troponin, will place on chest pain protocol and obtain study in the morning.   EKG at 5:48 AM, pulse of 69, normal sinus rhythm, normal axis, normal intervals, no left ventricular hypertrophy, normal ST segments, essentially unchanged.

## 2012-02-15 NOTE — ED Notes (Signed)
Pt. Reports SOB and chest soreness starting today. States "It feels like when I was taking lisinopril". Switched from lisinopril to cozaar on Wednesday. Pt. States "I just feel like I can't get a good deep breath, but I am feeling better than I did earlier today". Lung sounds clear. Radial pulses strong. Pt. Resting comfortably in bed.

## 2012-02-15 NOTE — Progress Notes (Signed)
Observation review is complete. 

## 2012-02-15 NOTE — ED Notes (Signed)
Patient is resting comfortably. 

## 2012-02-15 NOTE — ED Notes (Signed)
Pt. oob to the bathroom, gait steady, Pt. Denies any chest pain or sob.  Pt. Brushing her teeth and doing morning ADLS prior to her Stress Echo.

## 2012-02-15 NOTE — Progress Notes (Signed)
Arrival date & time 02/14/12  2342    First MD Initiated Contact with Patient 02/15/12 [phone]         Chief Complaint   Patient presents with   .  Shortness of Breath        (Consider location/radiation/quality/duration/timing/severity/associated sxs/prior treatment) HPI Comments: Pt recently started on antihypertensive medications for BP in the 170's and was having some associated palpitations.  Was seen at [facility] July 1st and had Toprol XL added to the Norvasc that she started 6/24.  Was doing OK for a few days and then had several days of hypertension to the 170's.  Was seen again at [facility] and [place] at that time she was feeling exhausted, was changed to Lisinopril 10mg  in place of Metoprolol which was 2 weeks ago.  Was on that for 6 days and started getting some SOB - became worse at night when felt very heavy in the chest.  Stopped ACE i 9 days ago and switched to Cozaar.  Week ago -  states "not feeling good" - feels dizzy and BP was 166 systolic / ?.  Next AM systolic was 100 and was feeling fatigued.  PMD called her and told her to 1/2 the dose of the Cozaar.  She didn't do this but instead took her 2 BP meds norvasc and Cozaar a further distance apart.  This seemed to help but today had some increased SOB, and had some heaviness in the chest as well.  This lasted for several hours.  Denies f/c/n/v.  No coughing, swelling, new rashes, no diarrhea, dysuria, back pain, abd pain.  Now has ongoing mild heaviness but has improved.    Patient is a 61 y.o. female presenting with shortness of breath. The history is provided by the patient and medical records.  Shortness of Breath   Associated symptoms include shortness of breath.       Past Medical History   Diagnosis  Date   .  Hypertension     .  Thyroid disease           Past Surgical History   Procedure  Date   .  Total knee arthroplasty          History reviewed. No pertinent family history.    History   Substance Use Topics    .  Smoking status:  Never Smoker    .  Smokeless tobacco:  Not on file   .  Alcohol Use:  Yes         wine once in a while         OB History      Grav  Para  Term  Preterm  Abortions  TAB  SAB  Ect  Mult  Living                                  Review of Systems  Respiratory: Positive for shortness of breath.   All other systems reviewed and are negative.      Allergies    Shrimp; Asa; Eggs or egg-derived products; Lisinopril; Penicillins; and Sudafed    Home Medications       Current Outpatient Rx   Name  Route  Sig  Dispense  Refill   .  AMLODIPINE BESYLATE 10 MG PO TABS  Oral  Take 10 mg by mouth daily.       .  AZELASTINE HCL 137 MCG/SPRAY NA  SOLN  Nasal  Place 1 spray into the nose 2 (two) times daily. Use in each nostril as directed       .  CETIRIZINE HCL 10 MG PO TABS  Oral  Take 10 mg by mouth daily as needed.       Marland Kitchen  VITAMIN D-3 PO  Oral  Take 1 tablet by mouth 3 (three) times a week.       Marland Kitchen  DOXYCYCLINE (ROSACEA) 40 MG PO CPDR  Oral  Take 40 mg by mouth every morning.       Marland Kitchen  OMEGA-3 FATTY ACIDS 1000 MG PO CAPS  Oral  Take 2 g by mouth daily.       Marland Kitchen  FLUTICASONE PROPIONATE 50 MCG/ACT NA SUSP  Nasal  Place 2 sprays into the nose daily.       Marland Kitchen  LEVOTHYROXINE SODIUM 50 MCG PO TABS  Oral  Take 50 mcg by mouth daily.       Marland Kitchen  LISINOPRIL 10 MG PO TABS  Oral  Take 1 tablet (10 mg total) by mouth daily.  30 tablet  0   .  LOSARTAN POTASSIUM 100 MG PO TABS  Oral  Take 1 tablet (100 mg total) by mouth daily.  30 tablet  1   .  METOPROLOL SUCCINATE ER 25 MG PO TB24  Oral  Take 1 tablet (25 mg total) by mouth daily.  30 tablet  3        BP 181/71  Pulse 76  Temp 97.6 F (36.4 C) (Oral)  Resp 16  SpO2 100%   Physical Exam  Nursing note and vitals reviewed. Constitutional: She appears well-developed and well-nourished. No distress.  HENT:   Head: Normocephalic and atraumatic.   Mouth/Throat: Oropharynx is clear and moist. No oropharyngeal exudate.    Eyes: Conjunctivae and EOM are normal. Pupils are equal, round, and reactive to light. Right eye exhibits no discharge. Left eye exhibits no discharge. No scleral icterus.  Neck: Normal range of motion. Neck supple. No JVD present. No thyromegaly present.  Cardiovascular: Normal rate, regular rhythm, normal heart sounds and intact distal pulses.  Exam reveals no gallop and no friction rub.    No murmur heard. Pulmonary/Chest: Effort normal and breath sounds normal. No respiratory distress. She has no wheezes. She has no rales.  Abdominal: Soft. Bowel sounds are normal. She exhibits no distension and no mass. There is no tenderness.  Musculoskeletal: Normal range of motion. She exhibits no edema and no tenderness.  Lymphadenopathy:    She has no cervical adenopathy.  Neurological: She is alert. Coordination normal.  Skin: Skin is warm and dry. No rash noted. No erythema.  Psychiatric: She has a normal mood and affect. Her behavior is normal.       ED Course    Procedures (including critical care time)     Labs Reviewed   CBC   BASIC METABOLIC PANEL      No results found.     No diagnosis found.        MDM    Exam is normal, no signs of rales, arrhythmia or swelling, ECG   ED ECG REPORT  I personally interpreted this EKG    Date: 02/15/2012    Rate: 77  Rhythm: normal sinus rhythm  QRS Axis: normal  Intervals: normal  ST/T Wave abnormalities: normal  Conduction Disutrbances:none  Narrative Interpretation:    Old EKG Reviewed: Compared with 01/07/2012, no significant changes   Patient  is low risk for myocardial infarction, has initial negative troponin, will place on chest pain protocol and obtain study in the morning.   EKG at 5:48 AM, pulse of 69, normal sinus rhythm, normal axis, normal intervals, no left ventricular hypertrophy, normal ST segments, essentially unchanged.  *RADIOLOGY REPORT*  Clinical Data: Shortness of breath  CHEST - 2 VIEW   Comparison: 02/04/2012  Findings: Mild aortic tortuosity and aortic arch atherosclerosis,  similar to prior. Heart size within normal range. Mediastinal and  hilar contours otherwise within normal limits. 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 No new areas of consolidation. No pleural  effusion or pneumothorax. Multilevel degenerative changes. No  acute osseous finding.  IMPRESSION:  No radiographic evidence of acute cardiopulmonary process.  Original Report Authenticated By: Waneta Martins, M.D.           Today, patient had stress echo which was normal.  Currently she feels good:  No chest pressure or shortness of breath.  Objective:  Vital signs reviewed (normal) Patient is alert and relaxed, in NAD  Chest:  Clear, no respiratory distress Heart:  Regular without murmur or gallop Ext:  No edema Skin:  No rashes.  Assessment:  Adverse reaction to cozaar (which she began 9 days ago)  Plan:  Avoid lisinopril and losartan drugs Continue the norvasc  I spent over 30 minutes with patient face to face

## 2012-02-15 NOTE — ED Notes (Signed)
Pt. At x ray

## 2012-02-15 NOTE — ED Notes (Addendum)
Complaining of SOB. started yesterday. Progressively worse throughout day. States taken off Lisinopril eight days ago due to SOB. Previous episode then. Denies pain at this time.

## 2012-02-15 NOTE — ED Notes (Signed)
Patient concerned about missing Cozaar dose. EDP notified about patients questions/concerns. Will continue to monitor patient

## 2012-02-15 NOTE — ED Provider Notes (Signed)
Medical screening examination/treatment/procedure(s) were performed by non-physician practitioner and as supervising physician I was immediately available for consultation/collaboration.  Flint Melter, MD 02/15/12 [phone]

## 2012-02-15 NOTE — ED Provider Notes (Signed)
History     CSN: [id number]  Arrival date & time 02/14/12  2342   First MD Initiated Contact with Patient 02/15/12 [phone]      Chief Complaint  Patient presents with  . Shortness of Breath    (Consider location/radiation/quality/duration/timing/severity/associated sxs/prior treatment) HPI Comments: Pt recently started on antihypertensive medications for BP in the 170's and was having some associated palpitations.  Was seen at [facility] July 1st and had Toprol XL added to the Norvasc that she started 6/24.  Was doing OK for a few days and then had several days of hypertension to the 170's.  Was seen again at [facility] and [place] at that time she was feeling exhausted, was changed to Lisinopril 10mg  in place of Metoprolol which was 2 weeks ago.  Was on that for 6 days and started getting some SOB - became worse at night when felt very heavy in the chest.  Stopped ACE i 9 days ago and switched to Cozaar.  Week ago -  states "not feeling good" - feels dizzy and BP was 166 systolic / ?.  Next AM systolic was 100 and was feeling fatigued.  PMD called her and told her to 1/2 the dose of the Cozaar.  She didn't do this but instead took her 2 BP meds norvasc and Cozaar a further distance apart.  This seemed to help but today had some increased SOB, and had some heaviness in the chest as well.  This lasted for several hours.  Denies f/c/n/v.  No coughing, swelling, new rashes, no diarrhea, dysuria, back pain, abd pain.  Now has ongoing mild heaviness but has improved.   Patient is a 61 y.o. female presenting with shortness of breath. The history is provided by the patient and medical records.  Shortness of Breath  Associated symptoms include shortness of breath.    Past Medical History  Diagnosis Date  . Hypertension   . Thyroid disease     Past Surgical History  Procedure Date  . Total knee arthroplasty     History reviewed. No pertinent family history.  History  Substance Use Topics  . Smoking  status: Never Smoker   . Smokeless tobacco: Not on file  . Alcohol Use: Yes     wine once in a while    OB History    Grav Para Term Preterm Abortions TAB SAB Ect Mult Living                  Review of Systems  Respiratory: Positive for shortness of breath.   All other systems reviewed and are negative.    Allergies  Shrimp; Asa; Eggs or egg-derived products; Lisinopril; Penicillins; and Sudafed  Home Medications   Current Outpatient Rx  Name Route Sig Dispense Refill  . AMLODIPINE BESYLATE 10 MG PO TABS Oral Take 10 mg by mouth daily.    . AZELASTINE HCL 137 MCG/SPRAY NA SOLN Nasal Place 1 spray into the nose 2 (two) times daily. Use in each nostril as directed    . CETIRIZINE HCL 10 MG PO TABS Oral Take 10 mg by mouth daily as needed.    Marland Kitchen VITAMIN D-3 PO Oral Take 1 tablet by mouth 3 (three) times a week.    Marland Kitchen DOXYCYCLINE (ROSACEA) 40 MG PO CPDR Oral Take 40 mg by mouth every morning.    Marland Kitchen OMEGA-3 FATTY ACIDS 1000 MG PO CAPS Oral Take 2 g by mouth daily.    Marland Kitchen FLUTICASONE PROPIONATE 50 MCG/ACT NA SUSP  Nasal Place 2 sprays into the nose daily.    Marland Kitchen LEVOTHYROXINE SODIUM 50 MCG PO TABS Oral Take 50 mcg by mouth daily.    Marland Kitchen LISINOPRIL 10 MG PO TABS Oral Take 1 tablet (10 mg total) by mouth daily. 30 tablet 0  . LOSARTAN POTASSIUM 100 MG PO TABS Oral Take 1 tablet (100 mg total) by mouth daily. 30 tablet 1  . METOPROLOL SUCCINATE ER 25 MG PO TB24 Oral Take 1 tablet (25 mg total) by mouth daily. 30 tablet 3    BP 181/71  Pulse 76  Temp 97.6 F (36.4 C) (Oral)  Resp 16  SpO2 100%  Physical Exam  Nursing note and vitals reviewed. Constitutional: She appears well-developed and well-nourished. No distress.  HENT:  Head: Normocephalic and atraumatic.  Mouth/Throat: Oropharynx is clear and moist. No oropharyngeal exudate.  Eyes: Conjunctivae and EOM are normal. Pupils are equal, round, and reactive to light. Right eye exhibits no discharge. Left eye exhibits no discharge. No  scleral icterus.  Neck: Normal range of motion. Neck supple. No JVD present. No thyromegaly present.  Cardiovascular: Normal rate, regular rhythm, normal heart sounds and intact distal pulses.  Exam reveals no gallop and no friction rub.   No murmur heard. Pulmonary/Chest: Effort normal and breath sounds normal. No respiratory distress. She has no wheezes. She has no rales.  Abdominal: Soft. Bowel sounds are normal. She exhibits no distension and no mass. There is no tenderness.  Musculoskeletal: Normal range of motion. She exhibits no edema and no tenderness.  Lymphadenopathy:    She has no cervical adenopathy.  Neurological: She is alert. Coordination normal.  Skin: Skin is warm and dry. No rash noted. No erythema.  Psychiatric: She has a normal mood and affect. Her behavior is normal.    ED Course  Procedures (including critical care time)   Labs Reviewed  CBC  BASIC METABOLIC PANEL   No results found.   No diagnosis found.    MDM  Exam is normal, no signs of rales, arrhythmia or swelling, ECG  ED ECG REPORT  I personally interpreted this EKG   Date: 02/15/2012   Rate: 77  Rhythm: normal sinus rhythm  QRS Axis: normal  Intervals: normal  ST/T Wave abnormalities: normal  Conduction Disutrbances:none  Narrative Interpretation:   Old EKG Reviewed: Compared with 01/07/2012, no significant changes  Patient is low risk for myocardial infarction, has initial negative troponin, will place on chest pain protocol and obtain study in the morning.  EKG at 5:48 AM, pulse of 69, normal sinus rhythm, normal axis, normal intervals, no left ventricular hypertrophy, normal ST segments, essentially unchanged. '  Pt on CP protocol, transferred to CDU - stable throughout stay while under my care.      Vida Roller, MD 02/16/12 [phone]

## 2012-02-15 NOTE — Progress Notes (Signed)
  Echocardiogram Echocardiogram Stress Test has been performed.  Angelica Levine 02/15/2012, 9:32 AM

## 2012-02-21 ENCOUNTER — Other Ambulatory Visit: Payer: Self-pay | Admitting: Family Medicine

## 2012-02-21 ENCOUNTER — Telehealth: Payer: Self-pay

## 2012-02-21 MED ORDER — TRIAMTERENE-HCTZ 37.5-25 MG PO TABS: 1.0000 | ORAL_TABLET | Freq: Every day | ORAL | Status: DC

## 2012-02-21 NOTE — Telephone Encounter (Signed)
I would like patient to pick up prescription for Maxzide and take one daily with the Norvasc.  Come in next week for BP recheck

## 2012-02-21 NOTE — Telephone Encounter (Signed)
PT STATES SHE WAS TOLD TO CALL BACK IF HER BP WAS HIGH AND IT IS. WAS SEEN BY DR Kenyon Ana PLEASE CALL [phone] OR HER CELL AT [phone]

## 2012-02-21 NOTE — Telephone Encounter (Signed)
Spoke with pt to get detail on blood pressure results: Saturday/Sunday her systolic #'s ranged in 130's Monday she checked twice both systolic #'s in high 150's Tuesday: 158/75, 140/78, 159/82 Wednesday: AM-171/85 (she started feeling dizzy and not feeling right), mid-day 149/83, PM-162/88 Thursday- AM-161/86, 160/81 and then 152/74 She is taking Generic Norvasc 10 mg right now. Please advise what you would like for her to do at this point. Thank you

## 2012-02-21 NOTE — Telephone Encounter (Signed)
Per Dr L he would take care of it and inform pt when to follow up/ date.  Also, just FYI: states ankles have been swelling could this be from Norvasc?

## 2012-02-21 NOTE — Telephone Encounter (Signed)
Called pt back to inform her rx sent into her pharmacy, advised pt to come in next week for BP recheck on the new medication. Pt understood and wanted to know if she could have appt for next Thursday at 104. I explained to pt that I would need to check to see if any available time slots and check with Dr L to see if we could do add on. In the meantime, I gave pt Dr Cain Saupe walk in hours for next week.  Spoke with Ramona and she will speak with Dr L and see what he would like to do.

## 2012-02-23 ENCOUNTER — Telehealth: Payer: Self-pay

## 2012-02-23 NOTE — Telephone Encounter (Signed)
PATIENT SAW DR. Elbert Ewings ON Thursday AND WAS PRESCRIBED A NEW BP MEDICINE.  SAYS SHE GOT UP THIS MORNING AND DIDN'T FEEL GOOD.  HER BP WAS HIGH AND PULSE WAS UP.  SHE TOOK THE MEDICINE AND IT IS STILL HIGH.  WANTS DR. L TO KNOW.  SHOULD SHE TRY SOMETHING ELSE?  SHOULD SHE COME IN?  [phone] (HOME) [phone] (CELL)

## 2012-02-24 NOTE — Telephone Encounter (Signed)
Patient needs to come in for recheck by one of the providers.

## 2012-02-24 NOTE — Telephone Encounter (Signed)
Spoke with patient, she is feeling much better today.  Has been taking bp readings and they are as follows:   169/84 yesterday pm 145/87 this am 178/89 early lunch 157/89 aftn 156/95 pulse:113 now  Patient plans to continue using bp med and continue to monitor blood pressure.  Will recheck this Thursday with Dr. Elbert Ewings, sooner if symptoms return.  To MD, FYI.

## 2012-02-28 ENCOUNTER — Ambulatory Visit (INDEPENDENT_AMBULATORY_CARE_PROVIDER_SITE_OTHER): Payer: BC Managed Care – PPO | Admitting: Family Medicine

## 2012-02-28 ENCOUNTER — Encounter: Payer: Self-pay | Admitting: Family Medicine

## 2012-02-28 VITALS — BP 160/80 | HR 97 | Temp 98.3°F | Resp 16 | Ht 64.5 in | Wt 147.4 lb

## 2012-02-28 DIAGNOSIS — R002 Palpitations: Secondary | ICD-10-CM

## 2012-02-28 DIAGNOSIS — I10 Essential (primary) hypertension: Secondary | ICD-10-CM

## 2012-02-28 DIAGNOSIS — K921 Melena: Secondary | ICD-10-CM

## 2012-02-28 DIAGNOSIS — L719 Rosacea, unspecified: Secondary | ICD-10-CM

## 2012-02-28 MED ORDER — METOPROLOL SUCCINATE ER 25 MG PO TB24: 25.0000 mg | ORAL_TABLET | Freq: Every day | ORAL | Status: DC

## 2012-02-28 NOTE — Patient Instructions (Addendum)
Results 39  Vitamin D, 25-hydroxy (Order [id number])         Value Range  30 - 89 ng/mL    Comments:    This assay accurately quantifies Vitamin D, which is the sum of the 25-Hydroxy forms of Vitamin D2 and D3.  Studies have shown that the optimum concentration of 25-Hydroxy Vitamin D is 30 ng/mL or higher.  Concentrations of Vitamin D between 20 and 29 ng/mL are considered to be insufficient and concentrations less than 20 ng/mL are considered to be deficient for Vitamin D.    Resulting Agency [facility]    Result Narrative       Performed at:  [facility]                [address]                [place], [place] [address]         Specimen Collected: 01/21/12 11:11 AM Last Resulted: 01/22/12 12:11 AM                Other Results from 01/21/2012       IFOBT PositiveVC      WBC 4.0 - 10.5 K/uL 6.6       9.0      7.2   10.3R   7.4R       RBC 3.87 - 5.11 MIL/uL 5.08       4.70      5.02   3.69 (L)R   3.56 (L)R       Hemoglobin 12.0 - 15.0 g/dL 14.7    82.9   56.2      14.9   11.1 (L)R   10.7 (L)R       HCT 36.0 - 46.0 % 43.6    41.0   40.4      43.8   32.5 (L)R   31.6 (L)R       MCV 78.0 - 100.0 fL 85.8       86.0      87.3   88.1R   88.9R       MCH 26.0 - 34.0 pg 28.9       29.1      29.7             MCHC 30.0 - 36.0 g/dL 13.0       86.5      78.4   34.1R   33.9R       RDW 11.5 - 15.5 % 13.8       12.9      12.5   12.8R   13.1R       Platelets 150 - 400 K/uL 278       200      238   223R   197R       Neutrophils Relative 43 - 77 % 70          74   71             Neutro Abs 1.7 - 7.7 K/uL 4.6          6.6   5.1             Lymphocytes Relative 12 - 46 % 23          19   23              Lymphs Abs 0.7 - 4.0 K/uL 1.5  1.7   1.7             Monocytes Relative 3 - 12 % 7          7   5              Monocytes Absolute 0.1 - 1.0 K/uL 0.4          0.6   0.4             Eosinophils Relative 0 - 5 % 0          0   1             Eosinophils Absolute 0.0  - 0.7 K/uL 0.0          0.0   0.1             Basophils Relative 0 - 1 % 0          0   0             Basophils Absolute 0.0 - 0.1 K/uL 0.0          0.0   0.0             Smear Review  Criteria for review not met            Criteria f...            Resulting Agency          Sodium 135 - 145 mEq/L 139    143   139   136R   139R   136R   141R       Potassium 3.5 - 5.3 mEq/L 4.0    3.8R   4.2   4.0R   4.4R   4.1R   3.8R       Chloride 96 - 112 mEq/L 103    104   104   99R   102R   102R   104R       CO2 19 - 32 mEq/L 24       25   27R   32R   29R   28R       Glucose, Bld 70 - 99 mg/dL 93    409 (H)   811 (H)   197 (H)R   144 (H)R   144 (H)R   79R       BUN 6 - 23 mg/dL 13    14   15    6R   4 (L)R   6R   9R       Creat 0.50 - 1.10 mg/dL 9.14    7.82   9.56   2.13Y   0.55R   0.65R   0.70R       Total Bilirubin 0.3 - 1.2 mg/dL 0.6       0.4            0.5R       Alkaline Phosphatase 39 - 117 U/L 83       90            95R       AST 0 - 37 U/L 18       19            34R       ALT 0 - 35 U/L 14       13            37 (H)R  Total Protein 6.0 - 8.3 g/dL 7.4       7.5            6.9R       Albumin 3.5 - 5.2 g/dL 4.5       4.5            3.9R       Calcium 8.4 - 10.5 mg/dL 9.7       9.8   1.6X   8.6R   8.6R   9.7R      Resulting Agency                  TSH 0.350 - 4.500 uIU/mL 1.731    1.740         Cholesterol 0 - 200 mg/dL 096    045 (H)CM      Comments:        ATP III Classification:       < 200        mg/dL        Desirable      409 - 239     mg/dL        Borderline High      >= 240        mg/dL        High           Triglycerides <150 mg/dL 811    914       HDL >78 mg/dL 40    43       Total CHOL/HDL Ratio Ratio 4.9    5.1       VLDL 0 - 40 mg/dL 25    23       LDL Cholesterol 0 - 99 mg/dL 295 (H)    621 (H)CM      Comments:          Total Cholesterol/HDL Ratio:CHD Risk                        Coronary Heart Disease Risk Table                                        Men        Women          1/2 Average Risk              3.4        3.3              Average Risk              5.0        4.4           2X Average Risk              9.6        7.1           3X Average Risk             23.4       11.0 Use the calculated Patient Ratio above and the CHD Risk table  to determine the patient's CHD Risk. ATP III Classification (LDL):       < 100        mg/dL         Optimal      308 - 129  mg/dL         Near or Above Optimal      130 - 159     mg/dL         Borderline High      160 - 189     mg/dL         High       > 981        mg/dL         Very High          Resulting Agency        Result Narrative       Performed at:  [facility]                [address], [address]                [place], [place] [address]    Vitamin B12        Value Range    Vitamin B-12 389    211 - 911 pg/mL    Resulting Agency [facility]              Component Value    WBC, Ur, HPF, POC neg    RBC, urine, microscopic 0-2    Bacteria, U Microscopic neg    Mucus, UA neg    Epithelial cells, urine per micros 0-2    Crystals, Ur, HPF, POC neg    Casts, Ur, LPF, POC neg    Yeast, UA neg    Resulting Agency [facility]    Specimen Collected: 01/21/12 10:11 AM Last Resulted: 01/21/12 10:11 AM             POCT urinalysis dipstick      Status: Final result   MyChart: Not Released   Next appt with me: None   Dx: Routine general medical examination a...          Component Value    Color, UA yellow    Clarity, UA clear    Glucose, UA neg    Bilirubin, UA neg    Ketones, UA neg    Spec Grav, UA 1.015    Blood, UA trace    pH, UA 6.0    Protein, UA neg    Urobilinogen, UA 0.2    Nitrite, UA neg    Leukocytes, UA Negative    Resulting Agency [facility]    Specimen Collected: 01/21/12 10:07 AM Last Resulted: 01/21/12 10:07 AM

## 2012-02-28 NOTE — Progress Notes (Signed)
61 yo woman who lives [place] farm in [place] who comes in for blood pressure reevaluation. She occasionally gets palpitations, but overall is feeling better after several outpatient visits and one ED visit in the past two months.   Objective:  148/84, alert and appropriate Heart:  Regular, no murmur Neck:  Supple, no HSM Chest:  Clear Ext:  No rash, no edema, normal gait  I spent 30 minutes with patient trying to be reassuring, reviewing labs and her BP log   Assessment:  Patient is anxious about her blood pressure, as evidenced by checking BP 4 times a day.  I think she is making good progress toward normal BP control  Plan:  Recheck one month Add Toprol XL25 daily 1. Hypertension  metoprolol succinate (TOPROL-XL) 25 MG 24 hr tablet  2. Hematochezia  Ambulatory referral to Gastroenterology  3. Rosacea    4. Palpitations             Results 39  Vitamin D, 25-hydroxy (Order [id number])         Value Range  30 - 89 ng/mL    Comments:    This assay accurately quantifies Vitamin D, which is the sum of the 25-Hydroxy forms of Vitamin D2 and D3.  Studies have shown that the optimum concentration of 25-Hydroxy Vitamin D is 30 ng/mL or higher.  Concentrations of Vitamin D between 20 and 29 ng/mL are considered to be insufficient and concentrations less than 20 ng/mL are considered to be deficient for Vitamin D.    Resulting Agency [facility]    Result Narrative       Performed at:  [facility]                [address]                [place], [place] [address]         Specimen Collected: 01/21/12 11:11 AM Last Resulted: 01/22/12 12:11 AM                Other Results from 01/21/2012       IFOBT PositiveVC      WBC 4.0 - 10.5 K/uL 6.6       9.0      7.2   10.3R   7.4R       RBC 3.87 - 5.11 MIL/uL 5.08       4.70      5.02   3.69 (L)R   3.56 (L)R       Hemoglobin 12.0 - 15.0 g/dL 25.3    66.4   40.3      14.9   11.1 (L)R   10.7 (L)R       HCT 36.0 - 46.0 %  43.6    41.0   40.4      43.8   32.5 (L)R   31.6 (L)R       MCV 78.0 - 100.0 fL 85.8       86.0      87.3   88.1R   88.9R       MCH 26.0 - 34.0 pg 28.9       29.1      29.7             MCHC 30.0 - 36.0 g/dL 47.4       25.9      56.3   34.1R   33.9R       RDW 11.5 - 15.5 %  13.8       12.9      12.5   12.8R   13.1R       Platelets 150 - 400 K/uL 278       200      238   223R   197R       Neutrophils Relative 43 - 77 % 70          74   71             Neutro Abs 1.7 - 7.7 K/uL 4.6          6.6   5.1             Lymphocytes Relative 12 - 46 % 23          19   23              Lymphs Abs 0.7 - 4.0 K/uL 1.5          1.7   1.7             Monocytes Relative 3 - 12 % 7          7   5              Monocytes Absolute 0.1 - 1.0 K/uL 0.4          0.6   0.4             Eosinophils Relative 0 - 5 % 0          0   1             Eosinophils Absolute 0.0 - 0.7 K/uL 0.0          0.0   0.1             Basophils Relative 0 - 1 % 0          0   0             Basophils Absolute 0.0 - 0.1 K/uL 0.0          0.0   0.0             Smear Review  Criteria for review not met            Criteria f...            Resulting Agency          Sodium 135 - 145 mEq/L 139    143   139   136R   139R   136R   141R       Potassium 3.5 - 5.3 mEq/L 4.0    3.8R   4.2   4.0R   4.4R   4.1R   3.8R       Chloride 96 - 112 mEq/L 103    104   104   99R   102R   102R   104R       CO2 19 - 32 mEq/L 24       25   27R   32R   29R   28R       Glucose, Bld 70 - 99 mg/dL 93    960 (H)   454 (H)   197 (H)R   144 (H)R   144 (H)R   79R       BUN 6 - 23 mg/dL 13    14   15    6R   4 (  L)R   6R   9R       Creat 0.50 - 1.10 mg/dL 1.61    0.96   0.45   4.09W   0.55R   0.65R   0.70R       Total Bilirubin 0.3 - 1.2 mg/dL 0.6       0.4            0.5R       Alkaline Phosphatase 39 - 117 U/L 83       90            95R       AST 0 - 37 U/L 18       19            34R       ALT 0 - 35 U/L 14       13            37 (H)R       Total Protein 6.0 - 8.3 g/dL 7.4        7.5            6.9R       Albumin 3.5 - 5.2 g/dL 4.5       4.5            3.9R       Calcium 8.4 - 10.5 mg/dL 9.7       9.8   1.1B   8.6R   8.6R   9.7R      Resulting Agency                  TSH 0.350 - 4.500 uIU/mL 1.731    1.740         Cholesterol 0 - 200 mg/dL 147    829 (H)CM      Comments:        ATP III Classification:       < 200        mg/dL        Desirable      562 - 239     mg/dL        Borderline High      >= 240        mg/dL        High           Triglycerides <150 mg/dL 130    865       HDL >78 mg/dL 40    43       Total CHOL/HDL Ratio Ratio 4.9    5.1       VLDL 0 - 40 mg/dL 25    23       LDL Cholesterol 0 - 99 mg/dL 469 (H)    629 (H)CM      Comments:          Total Cholesterol/HDL Ratio:CHD Risk                        Coronary Heart Disease Risk Table                                        Men       Women          1/2 Average Risk              3.4  3.3              Average Risk              5.0        4.4           2X Average Risk              9.6        7.1           3X Average Risk             23.4       11.0 Use the calculated Patient Ratio above and the CHD Risk table  to determine the patient's CHD Risk. ATP III Classification (LDL):       < 100        mg/dL         Optimal      960 - 129     mg/dL         Near or Above Optimal      130 - 159     mg/dL         Borderline High      160 - 189     mg/dL         High       > 454        mg/dL         Very High          Resulting Agency        Result Narrative       Performed at:  [facility]                [address], [address]                [place], [place] [address]    Vitamin B12        Value Range    Vitamin B-12 389    211 - 911 pg/mL    Resulting Agency [facility]              Component Value    WBC, Ur, HPF, POC neg    RBC, urine, microscopic 0-2    Bacteria, U Microscopic neg    Mucus, UA neg    Epithelial cells, urine per micros 0-2    Crystals, Ur, HPF, POC neg      Casts, Ur, LPF, POC neg    Yeast, UA neg    Resulting Agency [facility]    Specimen Collected: 01/21/12 10:11 AM Last Resulted: 01/21/12 10:11 AM             POCT urinalysis dipstick      Status: Final result   MyChart: Not Released   Next appt with me: None   Dx: Routine general medical examination a...          Component Value    Color, UA yellow    Clarity, UA clear    Glucose, UA neg    Bilirubin, UA neg    Ketones, UA neg    Spec Grav, UA 1.015    Blood, UA trace    pH, UA 6.0    Protein, UA neg    Urobilinogen, UA 0.2    Nitrite, UA neg    Leukocytes, UA Negative    Resulting Agency [facility]    Specimen Collected: 01/21/12 10:07 AM Last Resulted: 01/21/12 10:07 AM

## 2012-03-03 ENCOUNTER — Encounter: Payer: Self-pay | Admitting: Internal Medicine

## 2012-03-03 ENCOUNTER — Ambulatory Visit (INDEPENDENT_AMBULATORY_CARE_PROVIDER_SITE_OTHER): Payer: BC Managed Care – PPO | Admitting: Internal Medicine

## 2012-03-03 VITALS — BP 100/60 | HR 70 | Ht 64.5 in | Wt 148.0 lb

## 2012-03-03 DIAGNOSIS — R002 Palpitations: Secondary | ICD-10-CM

## 2012-03-03 NOTE — Progress Notes (Signed)
HPI Patient is a 61 year old who was referred for evaluation of abnormal EKG Patient has noticed heart racing intermitt.  Began 1 year ago. TSH normal. June 24 first time went to ER  Felt heart pounding out of chest.  BP was high at time.  Put on Norvasc.  Dose increase She notes in mid afternoon HR increased and BP went up Seen by Dr. Dareen Piano  Placed on metoprolol 5 nights in a row had heart pounding, HR increased.  Took off toprol  Put on lisinopril  Developed cough, Chest tightness.  Then put on Cozaar.  BP started dropping  (100 mg)  Stopped that  Couldn't breathe.  BP has been pretty good  Highest 158.  Occasional palpitaotins Was 176/  Last week  Started on Maxzide. Notes she is breathing better.  BP seems better. Still having palpitations.  When BP good and hearrt not racing feels pretty good.  No CP. Allergies  Allergen Reactions  . Shrimp (Shellfish Allergy) Anaphylaxis  . Asa (Aspirin) Other (See Comments)  . Eggs Or Egg-Derived Products Other (See Comments)    Reaction unknown  . Lisinopril Cough  . Losartan   . Penicillins Hives  . Sudafed (Pseudoephedrine Hcl) Hypertension    Current Outpatient Prescriptions  Medication Sig Dispense Refill  . amLODipine (NORVASC) 10 MG tablet Take 10 mg by mouth every evening.       . cetirizine (ZYRTEC) 10 MG tablet Take 10 mg by mouth daily.      . fluticasone (FLONASE) 50 MCG/ACT nasal spray Place 2 sprays into the nose daily.      Marland Kitchen levothyroxine (SYNTHROID, LEVOTHROID) 50 MCG tablet Take 50 mcg by mouth daily.      . metoprolol succinate (TOPROL-XL) 25 MG 24 hr tablet Take 1 tablet (25 mg total) by mouth daily.  90 tablet  3  . triamterene-hydrochlorothiazide (MAXZIDE-25) 37.5-25 MG per tablet Take 1 each (1 tablet total) by mouth daily.  90 tablet  3    Past Medical History  Diagnosis Date  . Hypertension   . Thyroid disease     Past Surgical History  Procedure Date  . Total knee arthroplasty     No family history  on file.  History   Social History  . Marital Status: Married    Spouse Name: N/A    Number of Children: N/A  . Years of Education: N/A   Occupational History  . Not on file.   Social History Main Topics  . Smoking status: Never Smoker   . Smokeless tobacco: Not on file  . Alcohol Use: Yes     wine once in a while  . Drug Use: No  . Sexually Active: Not on file   Other Topics Concern  . Not on file   Social History Narrative  . No narrative on file    Review of Systems:  All systems reviewed.  They are negative to the above problem except as previously stated.  Vital Signs: BP 128/80  Pulse 80  Ht 5' 4.5" (1.638 m)  Wt 148 lb (67.132 kg)  BMI 25.01 kg/m2  Physical Exam Patient is in NAD HEENT:  Normocephalic, atraumatic. EOMI, PERRLA.  Neck: JVP is normal.  No bruits.  Lungs: clear to auscultation. No rales no wheezes.  Heart: Regular rate and rhythm. Normal S1, S2. No S3.   No significant murmurs. PMI not displaced.  Abdomen:  Supple, nontender. Normal bowel sounds. No masses. No hepatomegaly.  Extremities:  Good distal pulses throughout. No lower extremity edema.  Musculoskeletal :moving all extremities.  Neuro:   alert and oriented x3.  CN II-XII grossly intact.  EKG:  8/2:  SR.  Nonspecific ST T wave changes Assessment and Plan:  1.  Palpitations.  I am not sure what these spells represent    I would recomm an echo as well as an event monitor. Continue current meds.  Avoid stimulants.  2.  HTN.  BP is good now.  WIll need to follow .

## 2012-03-03 NOTE — Patient Instructions (Addendum)
Your physician has requested that you have an echocardiogram. Echocardiography is a painless test that uses sound waves to create images of your heart. It provides your doctor with information about the size and shape of your heart and how well your heart's chambers and valves are working. This procedure takes approximately one hour. There are no restrictions for this procedure.  Your physician has recommended that you wear an event monitor. Event monitors are medical devices that record the heart's electrical activity. Doctors most often us these monitors to diagnose arrhythmias. Arrhythmias are problems with the speed or rhythm of the heartbeat. The monitor is a small, portable device. You can wear one while you do your normal daily activities. This is usually used to diagnose what is causing palpitations/syncope (passing out).   

## 2012-03-05 ENCOUNTER — Ambulatory Visit (HOSPITAL_COMMUNITY): Payer: BC Managed Care – PPO | Attending: Cardiology

## 2012-03-05 ENCOUNTER — Encounter (INDEPENDENT_AMBULATORY_CARE_PROVIDER_SITE_OTHER): Payer: BC Managed Care – PPO

## 2012-03-05 DIAGNOSIS — I10 Essential (primary) hypertension: Secondary | ICD-10-CM | POA: Insufficient documentation

## 2012-03-05 DIAGNOSIS — R002 Palpitations: Secondary | ICD-10-CM

## 2012-03-05 DIAGNOSIS — E039 Hypothyroidism, unspecified: Secondary | ICD-10-CM | POA: Insufficient documentation

## 2012-03-05 NOTE — Progress Notes (Signed)
Echocardiogram performed.  

## 2012-03-11 ENCOUNTER — Telehealth: Payer: Self-pay

## 2012-03-11 MED ORDER — AMLODIPINE BESYLATE 5 MG PO TABS: ORAL_TABLET | ORAL | Status: DC

## 2012-03-11 NOTE — Telephone Encounter (Signed)
It would make sense to send in Amlodipine 10mg  qd if patient would like.

## 2012-03-11 NOTE — Telephone Encounter (Signed)
Patient asking for Amlodipine 5mg  she is taking it bid, [facility] she states dose was changed when she went to ER. Would like 34 day supply.

## 2012-03-11 NOTE — Telephone Encounter (Signed)
She is asking for the 5 mg bid, this is why pharmacy is asking her to call us, she has appt with cardiology and hopes she can cut back to one daily when she sees cardiology.

## 2012-03-11 NOTE — Telephone Encounter (Signed)
PT STATES THE PHARMACY WANTED HER TO CALL us SINCE SHE USE TO TAKE 1 A DAY OF THE AMLODIPINE 5MG S TWICE A DAY PLEASE CALL [phone]    [facility] [id number]

## 2012-03-11 NOTE — Telephone Encounter (Signed)
Done and sent in 

## 2012-03-12 NOTE — Telephone Encounter (Signed)
I have called patient to advise.  

## 2012-03-16 ENCOUNTER — Ambulatory Visit (INDEPENDENT_AMBULATORY_CARE_PROVIDER_SITE_OTHER): Payer: BC Managed Care – PPO | Admitting: Family Medicine

## 2012-03-16 VITALS — BP 136/70 | HR 74 | Temp 97.7°F | Resp 16 | Ht 64.5 in | Wt 149.0 lb

## 2012-03-16 DIAGNOSIS — I10 Essential (primary) hypertension: Secondary | ICD-10-CM

## 2012-03-16 DIAGNOSIS — R609 Edema, unspecified: Secondary | ICD-10-CM

## 2012-03-16 MED ORDER — METOPROLOL TARTRATE 25 MG PO TABS: 25.0000 mg | ORAL_TABLET | Freq: Two times a day (BID) | ORAL | Status: DC

## 2012-03-16 MED ORDER — AMLODIPINE BESYLATE 10 MG PO TABS: 10.0000 mg | ORAL_TABLET | Freq: Every day | ORAL | Status: DC

## 2012-03-16 NOTE — Progress Notes (Signed)
61 yo woman who [place] farm in [place] who comes in for blood pressure reevaluation.  She occasionally gets palpitations, but overall is feeling better after several outpatient visits and one ED visit in the past two months.  She did have some edema recently. Objective: 148/84, alert and appropriate, seen with husband Heart: Regular, no murmur  Neck: Supple, no HSM  Chest: Clear  Ext: No rash, no edema, normal gait  I spent 30 minutes with patient trying to be reassuring, reviewing labs and her BP log  Assessment: Patient is anxious about her blood pressure, as evidenced by checking BP 4-6 times a day. I think she is making good progress toward normal BP control  Plan:  Recheck one month  Add Toprol XL25 bid Continue Norvasc 10 qd, Maxzide 25 qd 1.  Hypertension  metoprolol succinate (TOPROL-XL) 25 MG 24 hr tablet   2.  Hematochezia  Ambulatory referral to Gastroenterology   3.  Rosacea    4.  Palpitations

## 2012-04-03 ENCOUNTER — Ambulatory Visit (INDEPENDENT_AMBULATORY_CARE_PROVIDER_SITE_OTHER): Payer: BC Managed Care – PPO | Admitting: Family Medicine

## 2012-04-03 ENCOUNTER — Encounter: Payer: Self-pay | Admitting: Family Medicine

## 2012-04-03 VITALS — BP 158/82 | HR 61 | Temp 98.0°F | Resp 16 | Ht 64.5 in | Wt 150.0 lb

## 2012-04-03 DIAGNOSIS — I10 Essential (primary) hypertension: Secondary | ICD-10-CM

## 2012-04-03 DIAGNOSIS — R002 Palpitations: Secondary | ICD-10-CM

## 2012-04-03 NOTE — Progress Notes (Signed)
@[email or web address] @  Patient ID: Angelica Levine MRN: [id number], DOB: August 07, 1950, 61 y.o. Date of Encounter: 04/03/2012, 12:17 PM  Primary Physician: Tally Due, MD  Chief Complaint: HTN  HPI: 61 y.o. year old female with history below presents for hypertension follow up. Recently had some shortness of breath briefly. She has intermittent blood pressure elevations over 140 systolic for just the last two days. Also, she is having intermittent palpitations.  She's wearing a cardiac monitor now (Dr. Tenny Craw appt tomorrow)  Checks blood pressure 4 to 5 times a day and sometimes at night.  I reviewed pressures from the last two weeks of which 90% of readings are under 130/80!  Diet consists of low sodium. No CP, HA, visual changes, or focal deficits.   Past Medical History  Diagnosis Date  . Hypertension   . Thyroid disease      Home Meds: Prior to Admission medications   Medication Sig Start Date End Date Taking? Authorizing Provider  amLODipine (NORVASC) 10 MG tablet Take 1 tablet (10 mg total) by mouth daily. 03/16/12 03/16/13 Yes Elvina Sidle, MD  cetirizine (ZYRTEC) 10 MG tablet Take 10 mg by mouth daily.   Yes Historical Provider, MD  fluticasone (FLONASE) 50 MCG/ACT nasal spray Place 2 sprays into the nose daily.   Yes Historical Provider, MD  levothyroxine (SYNTHROID, LEVOTHROID) 50 MCG tablet Take 50 mcg by mouth daily.   Yes Historical Provider, MD  metoprolol tartrate (LOPRESSOR) 25 MG tablet Take 1 tablet (25 mg total) by mouth 2 (two) times daily. 03/16/12 03/16/13 Yes Elvina Sidle, MD  triamterene-hydrochlorothiazide (MAXZIDE-25) 37.5-25 MG per tablet Take 1 each (1 tablet total) by mouth daily. 02/21/12 02/20/13 Yes Elvina Sidle, MD  metoprolol succinate (TOPROL-XL) 25 MG 24 hr tablet Take 1 tablet (25 mg total) by mouth daily. 02/28/12 02/27/13  Elvina Sidle, MD    Allergies:  Allergies  Allergen Reactions  . Shrimp (Shellfish Allergy) Anaphylaxis  . Asa (Aspirin) Other  (See Comments)  . Eggs Or Egg-Derived Products Other (See Comments)    Reaction unknown  . Lisinopril Cough  . Losartan   . Penicillins Hives  . Sudafed (Pseudoephedrine Hcl) Hypertension    History   Social History  . Marital Status: Married    Spouse Name: N/A    Number of Children: N/A  . Years of Education: N/A   Occupational History  . Not on file.   Social History Main Topics  . Smoking status: Never Smoker   . Smokeless tobacco: Not on file  . Alcohol Use: Yes     wine once in a while  . Drug Use: No  . Sexually Active: Not on file   Other Topics Concern  . Not on file   Social History Narrative  . No narrative on file     No family history on file.  Review of Systems: Constitutional: negative for chills, fever, night sweats, weight changes, or fatigue  HEENT: negative for vision changes, hearing loss, congestion, rhinorrhea, ST, epistaxis, or sinus pressure Cardiovascular: negative for chest pain, or DOE Respiratory: negative for hemoptysis, wheezing, or cough Abdominal: negative for abdominal pain, nausea, vomiting, diarrhea, or constipation Dermatological: negative for rash Neurologic: negative for headache, dizziness, or syncope All other systems reviewed and are otherwise negative with the exception to those above and in the HPI.   Physical Exam: Blood pressure 158/82, pulse 61, temperature 98 F (36.7 C), temperature source Oral, resp. rate 16, height 5' 4.5" (1.638 m), weight 150 lb (68.04 kg),  SpO2 98.00%., Body mass index is 25.35 kg/(m^2). General: Well developed, well nourished, in no acute distress. Head: Normocephalic, atraumatic, eyes without discharge, sclera non-icteric, nares are without discharge. Bilateral auditory canals clear, TM's are without perforation, pearly grey and translucent with reflective cone of light bilaterally. Oral cavity moist, posterior pharynx without exudate, erythema, peritonsillar abscess, or post nasal  drip.  Neck: Supple. No thyromegaly. Full ROM. No lymphadenopathy. No carotid bruits. Lungs: Clear bilaterally to auscultation without wheezes, rales, or rhonchi. Breathing is unlabored. Heart: RRR with S1 S2. No murmurs, rubs, or gallops appreciated.  Abdomen: Soft, non-tender, non-distended with normoactive bowel sounds. No hepatosplenomegaly. No rebound/guarding. No obvious abdominal masses. Msk:  Strength and tone normal for age. Extremities/Skin: Warm and dry. No clubbing or cyanosis. No edema. No rashes or suspicious lesions. Distal pulses 2+ and equal bilaterally. Neuro: Alert and oriented X 3. Moves all extremities spontaneously. Gait is normal. CNII-XII grossly in tact. DTR 2+, cerebellar function intact. [place] normal. Psych:  Responds to questions appropriately with a normal affect.   I spent 25 minutes face to face with patient trying to be reassuring.  ASSESSMENT AND PLAN:  61 y.o. year old female with hypertension and palpitations. Maxzide, Amlodipine 10 mg, Metoprolol 25 bid Follow up with Dr. Tenny Craw tomorrow. -  Signed, Elvina Sidle, MD 04/03/2012 12:17 PM

## 2012-04-04 ENCOUNTER — Ambulatory Visit (INDEPENDENT_AMBULATORY_CARE_PROVIDER_SITE_OTHER): Payer: BC Managed Care – PPO | Admitting: Internal Medicine

## 2012-04-04 ENCOUNTER — Telehealth: Payer: Self-pay | Admitting: Internal Medicine

## 2012-04-04 ENCOUNTER — Encounter: Payer: Self-pay | Admitting: Internal Medicine

## 2012-04-04 VITALS — BP 152/81 | HR 84 | Ht 64.0 in | Wt 150.0 lb

## 2012-04-04 DIAGNOSIS — I10 Essential (primary) hypertension: Secondary | ICD-10-CM

## 2012-04-04 MED ORDER — AMLODIPINE BESYLATE 5 MG PO TABS: 5.0000 mg | ORAL_TABLET | Freq: Every day | ORAL | Status: DC

## 2012-04-04 MED ORDER — METOPROLOL SUCCINATE ER 50 MG PO TB24: 50.0000 mg | ORAL_TABLET | Freq: Two times a day (BID) | ORAL | Status: DC

## 2012-04-04 NOTE — Progress Notes (Signed)
HPI Patient is a 61 year old with a history of palpitations and HTN  I saw her back in August SInce seen she has had an echo which is normal.  She also had an event monitor  She has called in many times. Allergies  Allergen Reactions  . Shrimp (Shellfish Allergy) Anaphylaxis  . Asa (Aspirin) Other (See Comments)  . Eggs Or Egg-Derived Products Other (See Comments)    Reaction unknown  . Lisinopril Cough  . Losartan   . Penicillins Hives  . Sudafed (Pseudoephedrine Hcl) Hypertension    Current Outpatient Prescriptions  Medication Sig Dispense Refill  . amLODipine (NORVASC) 10 MG tablet Take 1 tablet (10 mg total) by mouth daily.  34 tablet  11  . cetirizine (ZYRTEC) 10 MG tablet Take 10 mg by mouth daily.      . fluticasone (FLONASE) 50 MCG/ACT nasal spray Place 2 sprays into the nose daily.      Marland Kitchen levothyroxine (SYNTHROID, LEVOTHROID) 50 MCG tablet Take 50 mcg by mouth daily.      . metoprolol succinate (TOPROL-XL) 25 MG 24 hr tablet 1 tab twice a day (pt takes long acting twice a day)      . triamterene-hydrochlorothiazide (MAXZIDE-25) 37.5-25 MG per tablet Take 1 each (1 tablet total) by mouth daily.  90 tablet  3    Past Medical History  Diagnosis Date  . Hypertension   . Thyroid disease     Past Surgical History  Procedure Date  . Total knee arthroplasty     No family history on file.  History   Social History  . Marital Status: Married    Spouse Name: N/A    Number of Children: N/A  . Years of Education: N/A   Occupational History  . Not on file.   Social History Main Topics  . Smoking status: Never Smoker   . Smokeless tobacco: Not on file  . Alcohol Use: Yes     wine once in a while  . Drug Use: No  . Sexually Active: Not on file   Other Topics Concern  . Not on file   Social History Narrative  . No narrative on file    Review of Systems:  All systems reviewed.  They are negative to the above problem except as previously stated.  Vital  Signs: BP 152/81  Pulse 84  Ht 5\' 4"  (1.626 m)  Wt 150 lb (68.04 kg)  BMI 25.75 kg/m2  Physical Exam Patient is in NAD HEENT:  Normocephalic, atraumatic. EOMI, PERRLA.  Neck: JVP is normal.  No bruits.  Lungs: clear to auscultation. No rales no wheezes.  Heart: Regular rate and rhythm. Normal S1, S2. No S3.   No significant murmurs. PMI not displaced.  Abdomen:  Supple, nontender. Normal bowel sounds. No masses. No hepatomegaly.  Extremities:   Good distal pulses throughout. No lower extremity edema.  Musculoskeletal :moving all extremities.  Neuro:   alert and oriented x3.  CN II-XII grossly intact.   Assessment and Plan:  1 Palpitations.  Patient has used monitor extensively for the past month.  I have reviewed.  Has SR with PACs  2 bursts of PAT  Longest was 5 (five) beats).  Often when she sensed palpitations with no arrhythmia.   Will go down on amlodipine  5 mg  Increase toprol to 50 bid  XL  2.  HTN.  BP at home is up and down.  I will make changes as noted.  She will  call in 2 to 3 wks.  I have encouraged her to stay active.

## 2012-04-04 NOTE — Patient Instructions (Addendum)
Your physician has recommended you make the following change in your medication: Increase Toprol to 50mg  1 tablet twice a day  Decrease Norvasc to 5 mg daily

## 2012-04-04 NOTE — Telephone Encounter (Signed)
New Problem:    Patient called in because she forgot to tell Dr. Tenny Craw at her appointment today that sometimes her legs swell when she stands up.  Please call back.

## 2012-04-04 NOTE — Telephone Encounter (Signed)
New Problem: ° ° ° °Patient called in because she forgot to tell Dr. Ross at her appointment today that sometimes her legs swell when she stands up.  Please call back. °

## 2012-04-04 NOTE — Telephone Encounter (Signed)
Pt states she was in to see Dr Tenny Craw this am that her legs swell sometimes when standing, especially at the end of the day.  The swelling is gone when she gets up in the morning.  She states they don't swell all the time.  It usually happens about once a week.  The swelling decreases with rest and elevation.  She just wanted Dr Tenny Craw to have this information.  No need to call back unless she needs to change something.

## 2012-04-08 ENCOUNTER — Telehealth: Payer: Self-pay | Admitting: Internal Medicine

## 2012-04-08 NOTE — Telephone Encounter (Signed)
plz return call to patient ASAP [phone]  Pt leaving for vacation and wants to know if she is cleared to have Sex.  Please return call ASAP as she leaves in the morning.

## 2012-04-08 NOTE — Telephone Encounter (Signed)
Pt states she and her husband are going on vacation for a few days and she would like to know if she can have sex while away.  The DOD, Dr. Excell Seltzer, reviewed her recent office notes and cleared the pt for sex.  Pt aware.

## 2012-04-14 ENCOUNTER — Ambulatory Visit (INDEPENDENT_AMBULATORY_CARE_PROVIDER_SITE_OTHER): Payer: BC Managed Care – PPO | Admitting: Family Medicine

## 2012-04-14 VITALS — BP 118/88 | HR 62 | Temp 98.0°F | Resp 16 | Ht 64.5 in | Wt 151.0 lb

## 2012-04-14 DIAGNOSIS — S335XXA Sprain of ligaments of lumbar spine, initial encounter: Secondary | ICD-10-CM

## 2012-04-14 DIAGNOSIS — M545 Low back pain, unspecified: Secondary | ICD-10-CM

## 2012-04-14 DIAGNOSIS — S39012A Strain of muscle, fascia and tendon of lower back, initial encounter: Secondary | ICD-10-CM

## 2012-04-14 DIAGNOSIS — R300 Dysuria: Secondary | ICD-10-CM

## 2012-04-14 LAB — POCT URINALYSIS DIPSTICK
Bilirubin, UA: NEGATIVE
Blood, UA: NEGATIVE
Glucose, UA: NEGATIVE
Ketones, UA: NEGATIVE
Leukocytes, UA: NEGATIVE
Nitrite, UA: NEGATIVE
Protein, UA: NEGATIVE
Spec Grav, UA: 1.01
Urobilinogen, UA: 0.2
pH, UA: 7

## 2012-04-14 LAB — POCT UA - MICROSCOPIC ONLY
Bacteria, U Microscopic: NEGATIVE
Casts, Ur, LPF, POC: NEGATIVE
Crystals, Ur, HPF, POC: NEGATIVE
Epithelial cells, urine per micros: NEGATIVE
Mucus, UA: NEGATIVE
RBC, urine, microscopic: NEGATIVE
WBC, Ur, HPF, POC: NEGATIVE
Yeast, UA: NEGATIVE

## 2012-04-14 MED ORDER — MELOXICAM 7.5 MG PO TABS: 7.5000 mg | ORAL_TABLET | Freq: Every day | ORAL | Status: DC

## 2012-04-14 MED ORDER — METHOCARBAMOL 750 MG PO TABS: 750.0000 mg | ORAL_TABLET | Freq: Four times a day (QID) | ORAL | Status: DC

## 2012-04-14 NOTE — Progress Notes (Signed)
S: Low back pain.  Helped her husband cleaning some [place] fowl.  Drinks a lot of fluid  O: Tender low back.  Pain with flexion and extension.  DTR symmetric    Results for orders placed in visit on 04/14/12  POCT UA - MICROSCOPIC ONLY      Component Value Range   WBC, Ur, HPF, POC neg     RBC, urine, microscopic neg     Bacteria, U Microscopic neg     Mucus, UA neg     Epithelial cells, urine per micros neg     Crystals, Ur, HPF, POC neg     Casts, Ur, LPF, POC neg     Yeast, UA neg    POCT URINALYSIS DIPSTICK      Component Value Range   Color, UA yellow     Clarity, UA clear     Glucose, UA neg     Bilirubin, UA neg     Ketones, UA neg     Spec Grav, UA 1.010     Blood, UA neg     pH, UA 7.0     Protein, UA neg     Urobilinogen, UA 0.2     Nitrite, UA neg     Leukocytes, UA Negative       A: Low back strain  Plan: Muscle relaxdant Can take NSAIDS despite ASA problems                 Mobic Robaxin

## 2012-04-21 ENCOUNTER — Ambulatory Visit: Payer: BC Managed Care – PPO | Admitting: Internal Medicine

## 2012-04-22 ENCOUNTER — Other Ambulatory Visit: Payer: Self-pay | Admitting: Obstetrics and Gynecology

## 2012-04-22 DIAGNOSIS — N6009 Solitary cyst of unspecified breast: Secondary | ICD-10-CM

## 2012-04-24 NOTE — Telephone Encounter (Signed)
Pt calling to let Dr Tenny Craw know that since decreasing amlodipine to 5mg  daily her swelling in her feet and legs is better. I will forward to Dr Tenny Craw for review.

## 2012-04-24 NOTE — Telephone Encounter (Signed)
New problem:  Was told to call back regarding medication. Doing a lot better.

## 2012-04-24 NOTE — Telephone Encounter (Signed)
Make sure to keep up with bp, that 5 of amlodipine is adequate.  Goal  SBP of 100-140/

## 2012-04-24 NOTE — Telephone Encounter (Signed)
LMTCB

## 2012-04-25 ENCOUNTER — Ambulatory Visit
Admission: RE | Admit: 2012-04-25 | Discharge: 2012-04-25 | Disposition: A | Discharge status: Home / Self Care | Payer: BC Managed Care – PPO | Source: Ambulatory Visit | Attending: Obstetrics and Gynecology | Admitting: Obstetrics and Gynecology

## 2012-04-25 DIAGNOSIS — N6009 Solitary cyst of unspecified breast: Secondary | ICD-10-CM

## 2012-04-25 NOTE — Telephone Encounter (Signed)
Pt was notified.  

## 2012-04-28 ENCOUNTER — Other Ambulatory Visit: Payer: Self-pay | Admitting: Obstetrics and Gynecology

## 2012-04-28 DIAGNOSIS — N63 Unspecified lump in unspecified breast: Secondary | ICD-10-CM

## 2012-05-06 ENCOUNTER — Ambulatory Visit (INDEPENDENT_AMBULATORY_CARE_PROVIDER_SITE_OTHER): Payer: BC Managed Care – PPO | Admitting: Internal Medicine

## 2012-05-06 VITALS — BP 129/74 | HR 59 | Temp 97.9°F | Resp 16 | Ht 64.5 in | Wt 151.0 lb

## 2012-05-06 DIAGNOSIS — R509 Fever, unspecified: Secondary | ICD-10-CM

## 2012-05-06 DIAGNOSIS — R5381 Other malaise: Secondary | ICD-10-CM

## 2012-05-06 DIAGNOSIS — J029 Acute pharyngitis, unspecified: Secondary | ICD-10-CM

## 2012-05-06 LAB — POCT RAPID STREP A (OFFICE): Rapid Strep A Screen: NEGATIVE

## 2012-05-06 MED ORDER — HYDROCODONE-ACETAMINOPHEN 7.5-500 MG/15ML PO SOLN: 5.0000 mL | Freq: Four times a day (QID) | ORAL | Status: DC | PRN

## 2012-05-06 MED ORDER — AZITHROMYCIN 500 MG PO TABS: 500.0000 mg | ORAL_TABLET | Freq: Every day | ORAL | Status: DC

## 2012-05-06 MED ORDER — LEVOFLOXACIN 500 MG PO TABS: 500.0000 mg | ORAL_TABLET | Freq: Every day | ORAL | Status: DC

## 2012-05-06 NOTE — Patient Instructions (Addendum)
Faringitis (viral y [place]) (Pharyngitis, Viral and Bacterial) Es una inflamacin (irritacin) o infeccin de la faringe. Tambin se denomina dolor de garganta.  CAUSAS La mayor parte de las anginas son causadas por virus y son parte de un resfro. Sin embargo, algunas anginas son ocasionadas por la bacteria estreptococo (germen) o por otras bacterias. La causa de la angina tambin puede ser el goteo nasal proveniente de los senos [place], [place] y, en algunos [place], se produce incluso por dormir con la boca abierta. Las anginas infecciosas pueden contagiarse de [place] Dear persona a otra por la tos, el estornudo y por compartir tasas o cubiertos. TRATAMIENTO Las anginas de causa viral generalmente duran entre 3 y [address] Estas infecciones virales generalmente mejoran sin antibiticos (medicamentos que destruyen grmenes). Las anginas por estreptococo y otras bacterias (grmenes) generalmente mejoran despus de 24 a 48 horas de Microbiologist con antibiticos. INSTRUCCIONES PARA EL CUIDADO DOMICILIARIO  Si en profesional considera que se trata de una infeccin bacteriana o si hay una prueba positiva para Event organiser, Administrator, sports un antibitico. Debe tomar todos los antibiticos Librarian, academic. Si no completa el tratamiento con antibiticos, usted o su hijo podrn enfermar nuevamente. Si usted o su hijo presentan un estreptococo en la garganta y no completan el tratamiento, podran sufrir un trastorno grave en el corazn o en los riones.   Beba gran cantidad de lquidos. Alrededor de 8-10 vasos grandes de agua por da. (Puede ser agua, jugos, licuados de frutas, Kool-aid, [place], [place], etc.).   Utilice los medicamentos de venta libre o de prescripcin para Chief Technology Officer, Environmental health practitioner o la [place], segn se lo indique el profesional que lo asiste.   Descanse lo suficiente.   Hgase grgaras con agua salada (1/2 cucharadita de sal en un vaso de agua) cada 1-2 horas si  lo necesita para [employer].   Si el paciente es mayor de [place], ofrzcale caramelos duros o Engineer, manufacturing o pastillas para la tos.  SOLICITE ATENCIN MDICA SI:  Si le aparecen bultos grandes y dolorosos en el cuello.   Presenta una erupcin.   Elimina un esputo verde, marrn-amarillento o sanguinolento.   El beb tiene ms de 3 meses y su temperatura rectal es de 100.5 F (38.1 C) o ms durante ms de 1 da.  SOLICITE ATENCIN MDICA DE INMEDIATO SI:  Siente rigidez en el cuello.   Usted o su hijo babean o no pueden tragar lquidos.   Usted o su hijo vomitan, no pueden retener los [employer].   Usted o su hijo presentan dolor intenso, que no se alivia con los [employer] han recetado.   Usted o su hijo presentan dificultad para respirar (y no se debe a la [place]).   Usted o su hijo no pueden abrir Scientist, product/process development.   Usted o su nio presentan aumento del dolor, hinchazn, enrojecimiento en el cuello.   Tiene fiebre.   Su beb tiene ms de 3 meses y su temperatura rectal es de 102 F (38.9 C) o ms.   Su beb tiene 3 meses o menos y su temperatura rectal es de 100.4 F (38 C) o ms.  EST SEGURO QUE:   Comprende las instrucciones para el alta mdica.   Controlar su enfermedad.   Solicitar atencin mdica de inmediato segn las indicaciones.  Document Released: 04/11/2005 Document Revised: 06/21/2011 [facility] Patient Information 2012 [place], [place].Strep Throat Strep throat is an infection of the throat caused by  a bacteria named Streptococcus pyogenes. Your caregiver may call the infection streptococcal "tonsillitis" or "pharyngitis" depending on whether there are signs of inflammation in the tonsils or back of the throat. Strep throat is most common in children from 46 to 74 years old during the cold months of the year, but it can occur in people of any age during any season. This infection is spread from person  to person (contagious) through coughing, sneezing, or other close contact. SYMPTOMS   Fever or chills.  Painful, swollen, red tonsils or throat.  Pain or difficulty when swallowing.  White or yellow spots on the tonsils or throat.  Swollen, tender lymph nodes or "glands" of the neck or under the jaw.  Red rash all over the body (rare). DIAGNOSIS  Many different infections can cause the same symptoms. A test must be done to confirm the diagnosis so the right treatment can be given. A "rapid strep test" can help your caregiver make the diagnosis in a few minutes. If this test is not available, a light swab of the infected area can be used for a throat culture test. If a throat culture test is done, results are usually available in a day or two. TREATMENT  Strep throat is treated with antibiotic medicine. HOME CARE INSTRUCTIONS   Gargle with 1 tsp of salt in 1 cup of warm water, 3 to 4 times per day or as needed for comfort.  Family members who also have a sore throat or fever should be tested for strep throat and treated with antibiotics if they have the strep infection.  Make sure everyone in your household washes their hands well.  Do not share food, drinking cups, or personal items that could cause the infection to spread to others.  You may need to eat a soft food diet until your sore throat gets better.  Drink enough water and fluids to keep your urine clear or pale yellow. This will help prevent dehydration.  Get plenty of rest.  Stay home from school, daycare, or work until you have been on antibiotics for 24 hours.  Only take over-the-counter or prescription medicines for pain, discomfort, or fever as directed by your caregiver.  If antibiotics are prescribed, take them as directed. Finish them even if you start to feel better. SEEK MEDICAL CARE IF:   The glands in your neck continue to enlarge.  You develop a rash, cough, or earache.  You cough up green,  yellow-brown, or bloody sputum.  You have pain or discomfort not controlled by medicines.  Your problems seem to be getting worse rather than better. SEEK IMMEDIATE MEDICAL CARE IF:   You develop any new symptoms such as vomiting, severe headache, stiff or painful neck, chest pain, shortness of breath, or trouble swallowing.  You develop severe throat pain, drooling, or changes in your voice.  You develop swelling of the neck, or the skin on the neck becomes red and tender.  You have a fever.  You develop signs of dehydration, such as fatigue, dry mouth, and decreased urination.  You become increasingly sleepy, or you cannot wake up completely. Document Released: 06/29/2000 Document Revised: 09/24/2011 Document Reviewed: 08/31/2010 [facility] Patient Information 2013 [place], [place]. Viral and Bacterial Pharyngitis Pharyngitis is soreness (inflammation) or infection of the pharynx. It is also called a sore throat. CAUSES  Most sore throats are caused by viruses and are part of a cold. However, some sore throats are caused by strep and other bacteria. Sore throats can  also be caused by post nasal drip from draining sinuses, allergies and sometimes from sleeping with an open mouth. Infectious sore throats can be spread from person to person by coughing, sneezing and sharing cups or eating utensils. TREATMENT  Sore throats that are viral usually last 3-4 days. Viral illness will get better without medications (antibiotics). Strep throat and other bacterial infections will usually begin to get better about 24-48 hours after you begin to take antibiotics. HOME CARE INSTRUCTIONS   If the caregiver feels there is a bacterial infection or if there is a positive strep test, they will prescribe an antibiotic. The full course of antibiotics must be taken. If the full course of antibiotic is not taken, you or your child may become ill again. If you or your child has strep throat and do not finish all  of the medication, serious heart or kidney diseases may develop.  Drink enough water and fluids to keep your urine clear or pale yellow.  Only take over-the-counter or prescription medicines for pain, discomfort or fever as directed by your caregiver.  Get lots of rest.  Gargle with salt water ( tsp. of salt in a glass of water) as often as every 1-2 hours as you need for comfort.  Hard candies may soothe the throat if individual is not at risk for choking. Throat sprays or lozenges may also be used. SEEK MEDICAL CARE IF:   Large, tender lumps in the neck develop.  A rash develops.  Green, yellow-brown or bloody sputum is coughed up.  Your baby is older than 3 months with a rectal temperature of 100.5 F (38.1 C) or higher for more than 1 day. SEEK IMMEDIATE MEDICAL CARE IF:   A stiff neck develops.  You or your child are drooling or unable to swallow liquids.  You or your child are vomiting, unable to keep medications or liquids down.  You or your child has severe pain, unrelieved with recommended medications.  You or your child are having difficulty breathing (not due to stuffy nose).  You or your child are unable to fully open your mouth.  You or your child develop redness, swelling, or severe pain anywhere on the neck.  You have a fever.  Your baby is older than 3 months with a rectal temperature of 102 F (38.9 C) or higher.  Your baby is 59 months old or younger with a rectal temperature of 100.4 F (38 C) or higher. MAKE SURE YOU:   Understand these instructions.  Will watch your condition.  Will get help right away if you are not doing well or get worse. Document Released: 07/02/2005 Document Revised: 09/24/2011 Document Reviewed: 09/29/2007 [facility] Patient Information 2013 [place], [place].

## 2012-05-06 NOTE — Progress Notes (Signed)
  Subjective:    Patient ID: Angelica Levine, female    DOB: 03/01/1951, 61 y.o.   MRN: [id number]  HPI Very ST, exposed to sick grand kids as well as sick D in [place]. No chest sxs. Has fever and fatigue   Review of Systems     Objective:   Physical Exam Very red swollen throat Tender ac glands Lungs clear  rst Results for orders placed in visit on 05/06/12  POCT RAPID STREP A (OFFICE)      Component Value Range   Rapid Strep A Screen Negative  Negative       Assessment & Plan:  Pharyngitis

## 2012-05-15 ENCOUNTER — Encounter: Payer: Self-pay | Admitting: Family Medicine

## 2012-05-15 ENCOUNTER — Ambulatory Visit (INDEPENDENT_AMBULATORY_CARE_PROVIDER_SITE_OTHER): Payer: BC Managed Care – PPO | Admitting: Family Medicine

## 2012-05-15 VITALS — BP 137/71 | HR 70 | Temp 98.0°F | Resp 16 | Ht 65.0 in | Wt 151.0 lb

## 2012-05-15 DIAGNOSIS — E039 Hypothyroidism, unspecified: Secondary | ICD-10-CM

## 2012-05-15 DIAGNOSIS — I10 Essential (primary) hypertension: Secondary | ICD-10-CM

## 2012-05-15 NOTE — Patient Instructions (Addendum)
Take your usual doses of medicine on Sunday.   Skip all morning medications except Synthroid on Monday morning. Resume usual medicine schedule on Monday evening.  Give Dr. Loreta Ave a copy of your immunizations to send to me after the colonoscopy on Monday

## 2012-05-15 NOTE — Progress Notes (Signed)
61 yo with hypertension.  Cardiologist is weaning her off the Norvasc and onto Metoprolol.  After reducing the Norvasc, fluid retention resolved and constipation resolved.  She is getting left breast cyst evaluated in next month  Will be getting colonoscopy next week (Monday)  Objective:  NAD Fundi:  Normal Neck:  No thyromegaly, bruits.  Supple without adenopathy Chest:  Clear Heart:  60 bpm, regular, no murmur Ext: no edema  I spent 30 minutes face to face with patient.  She seems to be a bit forgetful and I will follow this up next visit  Assessment:  Doing well, transitioning from Norvasc.  Question of early memory problems  Plan: Check CMET and TSH in December.

## 2012-05-20 ENCOUNTER — Telehealth: Payer: Self-pay | Admitting: Internal Medicine

## 2012-05-20 NOTE — Telephone Encounter (Signed)
Pt BP elevated on Sunday 11/3 & Today 170/88.  Please return call to pt at [phone] to advise.

## 2012-05-21 NOTE — Telephone Encounter (Signed)
Patient states  this past Monday she was scheduled for a colonoscopy. Prior the test patient was instructed by her GI doctor to get up at 3:00 AM to measure her BP and take her medication B/P at that time was 147/87  at 4:00 AM pt felt dizzy felt bad so she cancelled the procedure. Pt took her BP it was 176/94 she was NPO due to impending Colonoscopy. Pt states has been taken  BP medication on add times. Tuesday BP 146/81at 0500 at 7:45 AM 170/88. Today's BP has been 148/88 in the morning to 134/80 in the afternoon pulse 71. Pt states will go back to take her medication at her normal schedule as before to see if it helps, because she said her BP has been fine before this episode.  Pt is aware to call the office if needed.

## 2012-05-21 NOTE — Telephone Encounter (Signed)
LMTCB

## 2012-05-23 ENCOUNTER — Telehealth: Payer: Self-pay | Admitting: Internal Medicine

## 2012-05-23 NOTE — Telephone Encounter (Signed)
New problem:    c/o  Blood pressure issues on yesterday 164/77   pulse 67 @ 10:49 am   11:49 am 178/83 pulse 68.  @ 11pm 90/61 pulse 60. Today is o.k. 119/73. Pulse 67.

## 2012-05-28 NOTE — Telephone Encounter (Addendum)
Called patient in follow up. She states that her BP yesterday was 138/74 HR 61 and today this AM it was 132/71 HR 60 and this PM it was 146/86 HR 72 after cooking. Advised that if diastolic BP goes up she will have to increase Norvasc 5 mg to 10mg  every day per Dr.Ross. She takes Metoprolol 50mg  2 times per day also. She will check her BP every day and let us know if there is a problem.

## 2012-06-10 ENCOUNTER — Telehealth: Payer: Self-pay | Admitting: Internal Medicine

## 2012-06-10 MED ORDER — AMLODIPINE BESYLATE 10 MG PO TABS: 10.0000 mg | ORAL_TABLET | Freq: Every day | ORAL | Status: DC

## 2012-06-10 NOTE — Telephone Encounter (Signed)
Called patient back. BP still staying up. She has not increased the Norvasc yet. Still taking 5mg  every day. Advised her to increase Norvasc to 10mg  every day per previous MD order and to continue tracking BP and call with any issues. Will send in script to [facility].

## 2012-06-10 NOTE — Telephone Encounter (Signed)
Pt calling re BP at 238pm was 126/106 pulse 61 and 174/84 pulse 87, pls advise

## 2012-06-18 ENCOUNTER — Telehealth: Payer: Self-pay | Admitting: Internal Medicine

## 2012-06-18 NOTE — Telephone Encounter (Signed)
New Problem:    PAtient called in wanting to know if she would be cale to drink wine with her metoprolol succinate (TOPROL-XL) 50 MG 24 hr tablet and amLODipine (NORVASC) 10 MG tablet.  Please call back.

## 2012-06-23 ENCOUNTER — Ambulatory Visit
Admission: RE | Admit: 2012-06-23 | Discharge: 2012-06-23 | Disposition: A | Discharge status: Home / Self Care | Payer: BC Managed Care – PPO | Source: Ambulatory Visit | Attending: Obstetrics and Gynecology | Admitting: Obstetrics and Gynecology

## 2012-06-23 DIAGNOSIS — N63 Unspecified lump in unspecified breast: Secondary | ICD-10-CM

## 2012-06-23 NOTE — Telephone Encounter (Signed)
LMOM for call back. 

## 2012-06-23 NOTE — Telephone Encounter (Signed)
Called patient and advised OK to have 1 glass of wine with dinner.

## 2012-06-24 ENCOUNTER — Telehealth: Payer: Self-pay

## 2012-06-24 ENCOUNTER — Telehealth: Payer: Self-pay | Admitting: Internal Medicine

## 2012-06-24 NOTE — Telephone Encounter (Signed)
Pt advised,verbalized understanding. 

## 2012-06-24 NOTE — Telephone Encounter (Signed)
plz return call pt regarding elevated BP 163/90, no SOB or Chest pain at this time.

## 2012-06-24 NOTE — Telephone Encounter (Signed)
Is she taking 5 or 10 of amlodipine now? If 5 she should increase to 10 If 10 she can increase lopressor to 2 in AM, 1 in PM

## 2012-06-24 NOTE — Telephone Encounter (Signed)
Patient just needs to come in for blood testing and, after review, I will reorder her meds

## 2012-06-24 NOTE — Telephone Encounter (Signed)
Patient states that Synthroid was sent to to pharmacy from October. She just now is needing it. Angelica Levine on [address] #[phone]

## 2012-06-24 NOTE — Telephone Encounter (Signed)
Spoke with pt. Pt states yesterday morning her BP was 137/78. She did not feel great last night. She checked  her BP and it was163/90. Pt states BP this morning was 160/87 and 154/82---139/80 1 pm and  145/79 2:30pm. Pt asking if any medication adjustments should be made.  I will forward to Dr Tenny Craw for review.

## 2012-06-24 NOTE — Telephone Encounter (Signed)
Pt would like to come in tomorrow morning for labs, but would also like to have Vit D and lipid labs also. Dr L, can we put in orders for these other two tests also? Pt only needs a CB if there is a problem having these other tests added.

## 2012-06-24 NOTE — Telephone Encounter (Signed)
Dr. Elbert Ewings do you want pt to come in for lab work first and then refill med.  She was here in October but no tests were done and no meds were reordered.  Please advise.

## 2012-06-25 ENCOUNTER — Other Ambulatory Visit: Payer: Self-pay | Admitting: Family Medicine

## 2012-06-25 ENCOUNTER — Other Ambulatory Visit (INDEPENDENT_AMBULATORY_CARE_PROVIDER_SITE_OTHER): Payer: BC Managed Care – PPO | Admitting: Family Medicine

## 2012-06-25 ENCOUNTER — Telehealth: Payer: Self-pay

## 2012-06-25 DIAGNOSIS — E039 Hypothyroidism, unspecified: Secondary | ICD-10-CM

## 2012-06-25 DIAGNOSIS — I10 Essential (primary) hypertension: Secondary | ICD-10-CM

## 2012-06-25 DIAGNOSIS — E559 Vitamin D deficiency, unspecified: Secondary | ICD-10-CM

## 2012-06-25 DIAGNOSIS — E785 Hyperlipidemia, unspecified: Secondary | ICD-10-CM

## 2012-06-25 LAB — COMPREHENSIVE METABOLIC PANEL
ALT: 15 U/L (ref 0–35)
AST: 17 U/L (ref 0–37)
Albumin: 4.7 g/dL (ref 3.5–5.2)
Alkaline Phosphatase: 85 U/L (ref 39–117)
BUN: 13 mg/dL (ref 6–23)
CO2: 30 mEq/L (ref 19–32)
Calcium: 10.1 mg/dL (ref 8.4–10.5)
Chloride: 94 mEq/L — ABNORMAL LOW (ref 96–112)
Creat: 0.67 mg/dL (ref 0.50–1.10)
Glucose, Bld: 99 mg/dL (ref 70–99)
Potassium: 3.5 mEq/L (ref 3.5–5.3)
Sodium: 135 mEq/L (ref 135–145)
Total Bilirubin: 0.7 mg/dL (ref 0.3–1.2)
Total Protein: 7.6 g/dL (ref 6.0–8.3)

## 2012-06-25 LAB — TSH: TSH: 2.251 u[IU]/mL (ref 0.350–4.500)

## 2012-06-25 NOTE — Telephone Encounter (Signed)
Pt came in for bloodwork today for TSH ordered by dr L. She had a cpe in July and did not understand why she did not get a refill at that time for synthyroid ( saw dr guest in July). Please call to clarify. [phone]

## 2012-06-25 NOTE — Progress Notes (Signed)
Pt here for labs only. 

## 2012-06-26 ENCOUNTER — Telehealth: Payer: Self-pay

## 2012-06-26 NOTE — Telephone Encounter (Signed)
Mailed copy to her.

## 2012-06-26 NOTE — Telephone Encounter (Signed)
Pt received message re: most recent lab results, however she would like a copy mailed to her.  Verified address at [address]  [place], [place]  [address].  [phone]

## 2012-06-26 NOTE — Telephone Encounter (Signed)
Dr. Elbert Ewings, spoke with husband and we wanted to know if we can refill pt's Synthroid? Thanks

## 2012-06-26 NOTE — Telephone Encounter (Signed)
Patient states she needs Synthroid do you want her to continue current plan? How many mo. Do you want sent in for her?

## 2012-06-26 NOTE — Telephone Encounter (Signed)
Message copied by Johnnette Litter on Thu Jun 26, 2012  1:17 PM ------      Message from: Elvina Sidle      Created: Thu Jun 26, 2012 11:27 AM       Please inform patient of normal result

## 2012-06-26 NOTE — Telephone Encounter (Signed)
Because she needed labs, I need to discuss this with Dr. Milus Glazier, he states he will refill her meds when labs come back.

## 2012-06-27 MED ORDER — LEVOTHYROXINE SODIUM 50 MCG PO TABS: 50.0000 ug | ORAL_TABLET | Freq: Every day | ORAL | Status: DC

## 2012-06-27 NOTE — Telephone Encounter (Signed)
Thanks. Sent to her pharmacy. Left message for patient to advise.

## 2012-06-27 NOTE — Telephone Encounter (Signed)
Please call in Synthroid 50 mcg daily for 1 year #90 with 3 refills

## 2012-07-03 ENCOUNTER — Telehealth: Payer: Self-pay | Admitting: Internal Medicine

## 2012-07-03 MED ORDER — METOPROLOL SUCCINATE ER 50 MG PO TB24: ORAL_TABLET | ORAL | Status: DC

## 2012-07-03 NOTE — Telephone Encounter (Signed)
Called patient back. She needs a refill for Metroprolol sent to [facility] in [place] since the dose was increased.

## 2012-07-03 NOTE — Telephone Encounter (Signed)
plz return call to pt [phone] to discuss metoprolol medication.

## 2012-07-14 ENCOUNTER — Ambulatory Visit (INDEPENDENT_AMBULATORY_CARE_PROVIDER_SITE_OTHER): Payer: BC Managed Care – PPO | Admitting: Internal Medicine

## 2012-07-14 VITALS — BP 118/66 | HR 59 | Ht 64.0 in | Wt 151.0 lb

## 2012-07-14 DIAGNOSIS — I10 Essential (primary) hypertension: Secondary | ICD-10-CM

## 2012-07-14 DIAGNOSIS — R42 Dizziness and giddiness: Secondary | ICD-10-CM

## 2012-07-14 DIAGNOSIS — R002 Palpitations: Secondary | ICD-10-CM

## 2012-07-14 NOTE — Progress Notes (Signed)
HPI Patient is a 61 yo with a history of HTN and palpitations.  I saw her in Sept.  At that time I recomm increasing her toprol xl to 50 bid. Since seen she has called in with increased BP  Meds increased to 10 amlodipine and 100/50 bid The patient notes increased fatigue.  Dnies frank dizziness.  Just sluggish.  No CP  Breathing is short at times.  Caleen Essex was SOB>  Saturday felt irritalble ant a little SOB  SUn was irritable.   Allergies  Allergen Reactions  . Shrimp (Shellfish Allergy) Anaphylaxis  . Asa (Aspirin) Other (See Comments)  . Eggs Or Egg-Derived Products Other (See Comments)    Reaction unknown  . Lisinopril Cough  . Losartan   . Penicillins Hives  . Sudafed (Pseudoephedrine Hcl) Hypertension    Current Outpatient Prescriptions  Medication Sig Dispense Refill  . amLODipine (NORVASC) 10 MG tablet Take 1 tablet (10 mg total) by mouth daily.  30 tablet  6  . azithromycin (ZITHROMAX) 500 MG tablet Take 1 tablet (500 mg total) by mouth daily.  5 tablet  0  . cetirizine (ZYRTEC) 10 MG tablet Take 10 mg by mouth daily.      . fluticasone (FLONASE) 50 MCG/ACT nasal spray Place 2 sprays into the nose daily.      Marland Kitchen HYDROcodone-acetaminophen (LORTAB) 7.5-500 MG/15ML solution Take 5 mLs by mouth every 6 (six) hours as needed for pain.  240 mL  0  . levofloxacin (LEVAQUIN) 500 MG tablet Take 1 tablet (500 mg total) by mouth daily.  7 tablet  0  . levothyroxine (SYNTHROID, LEVOTHROID) 50 MCG tablet Take 1 tablet (50 mcg total) by mouth daily.  90 tablet  3  . metoprolol succinate (TOPROL-XL) 50 MG 24 hr tablet 2 tabs AM  and 1 tab PM (pt takes long acting twice a day)  102 tablet  11  . triamterene-hydrochlorothiazide (MAXZIDE-25) 37.5-25 MG per tablet Take 1 each (1 tablet total) by mouth daily.  90 tablet  3    Past Medical History  Diagnosis Date  . Hypertension   . Thyroid disease     Past Surgical History  Procedure Date  . Total knee arthroplasty     No family history on  file.  History   Social History  . Marital Status: Married    Spouse Name: N/A    Number of Children: N/A  . Years of Education: N/A   Occupational History  . Not on file.   Social History Main Topics  . Smoking status: Never Smoker   . Smokeless tobacco: Not on file  . Alcohol Use: Yes     Comment: wine once in a while  . Drug Use: No  . Sexually Active: Not on file   Other Topics Concern  . Not on file   Social History Narrative  . No narrative on file    Review of Systems:  All systems reviewed.  They are negative to the above problem except as previously stated.  Vital Signs: BP 118/66  Pulse 59  Ht 5\' 4"  (1.626 m)  Wt 151 lb (68.493 kg)  BMI 25.92 kg/m2  SpO2 97%  Physical Exam Patient is in NAD HEENT:  Normocephalic, atraumatic. EOMI, PERRLA.  Neck: JVP is normal.  No bruits.  Lungs: clear to auscultation. No rales no wheezes.  Heart: Regular rate and rhythm. Normal S1, S2. No S3.   No significant murmurs. PMI not displaced.  Abdomen:  Supple, nontender. Normal  bowel sounds. No masses. No hepatomegaly.  Extremities:   Good distal pulses throughout. No lower extremity edema.  Musculoskeletal :moving all extremities.  Neuro:   alert and oriented x3.  CN II-XII grossly intact.   Assessment and Plan:  1.  Fatigue/SOB  I am not convinced of angina.  QUestion if her heart rate is  Suppressed and she cannot mount a response.   I have asked her to cut back on toprol 1 tab bid  Not 2 in AM and 1 in PM   She will call in  1 week with response.  2.  HTN  As above  3.  Palpitations  Denies.

## 2012-07-14 NOTE — Patient Instructions (Signed)
Decrease Toprol to one half tab every day and call in 1 week to let us know how you feel.

## 2012-07-21 ENCOUNTER — Telehealth: Payer: Self-pay | Admitting: Internal Medicine

## 2012-07-21 NOTE — Telephone Encounter (Signed)
Pt reports that blood pressure and pulse readings are good since medication change Readings are as follows: 126/68, 58 132/62, 70 132/73, 62 114/68, 64 118/72, 68  Pt states occasional swallowing difficulty and throat pain.  Advised pt to contact PCP regarding these symptoms without delay.  Pt agreed.

## 2012-07-21 NOTE — Telephone Encounter (Signed)
New problem:   Was told by Dr. Tenny Craw to call back. Heart rate / blood pressure are fine.

## 2012-07-21 NOTE — Telephone Encounter (Signed)
Good Keep on same regimen 

## 2012-07-23 NOTE — Telephone Encounter (Signed)
Left message on private cell phone to stay on same medications because BP readings were very good.

## 2012-09-25 ENCOUNTER — Telehealth: Payer: Self-pay | Admitting: Internal Medicine

## 2012-09-25 NOTE — Telephone Encounter (Signed)
Pt called to report that she is going to start going to the gym to do some light exercise.

## 2012-09-25 NOTE — Telephone Encounter (Signed)
Pt wants to know if she can go back to gym due to her total knee replacement

## 2012-10-06 ENCOUNTER — Telehealth: Payer: Self-pay | Admitting: Internal Medicine

## 2012-10-06 NOTE — Telephone Encounter (Signed)
New problem   Per pt b/p is 173/93 pulse 76 she wants to know what she should do

## 2012-10-06 NOTE — Telephone Encounter (Signed)
Advised pt to check her B/P tonight about 1 hour after taking Norvasc and again in the morning about 1 hour after Metoprolol and maxzide and call me back with readings tomorrow.  Pt agreed.

## 2012-10-07 NOTE — Telephone Encounter (Signed)
Reassured pt

## 2012-10-07 NOTE — Telephone Encounter (Signed)
F/U    Per pt was told to talk her b/p and call you back- Results: b/p 8am 148/71 pulse: 58 took meds and @ 9:15a b/p 118/77 pulse: 55 10:18a b/p 106/70 pulse: 59  At 1:40p b/p 137/73 pulse: 62- Last night at 6p took amlodipine @ 7pm b/p 144/80 pulse: 87  @ 8:30p took generic toprol @ 9:30p b/p 135/79 pulse: 62

## 2012-10-10 IMAGING — CR DG CHEST 2V
2 series · 2 of 2 positions shown · non-contrast
Comparison: 02/04/2012

CLINICAL DATA: Shortness of breath

CHEST - 2 VIEW

[w chest pa]
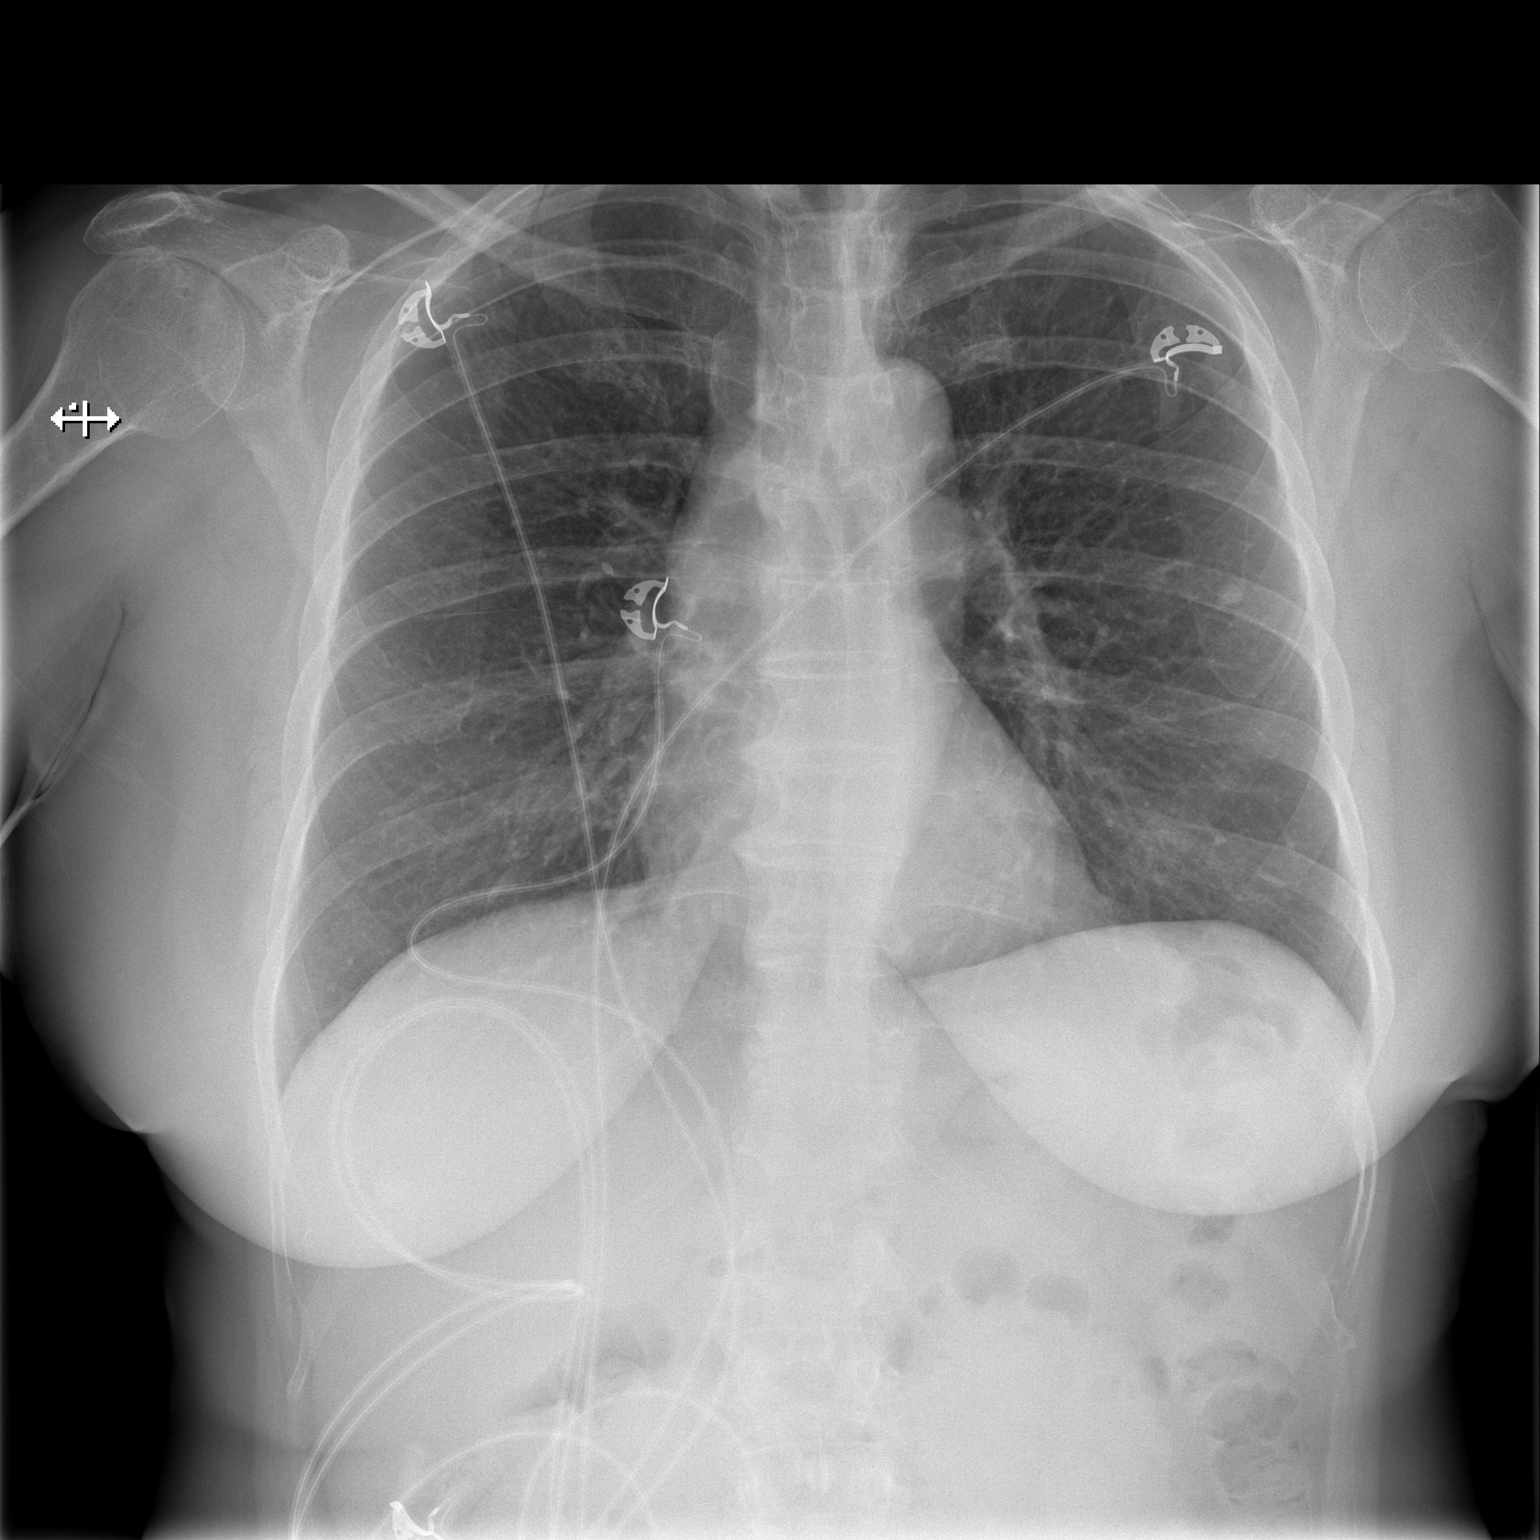

[w chest lat]
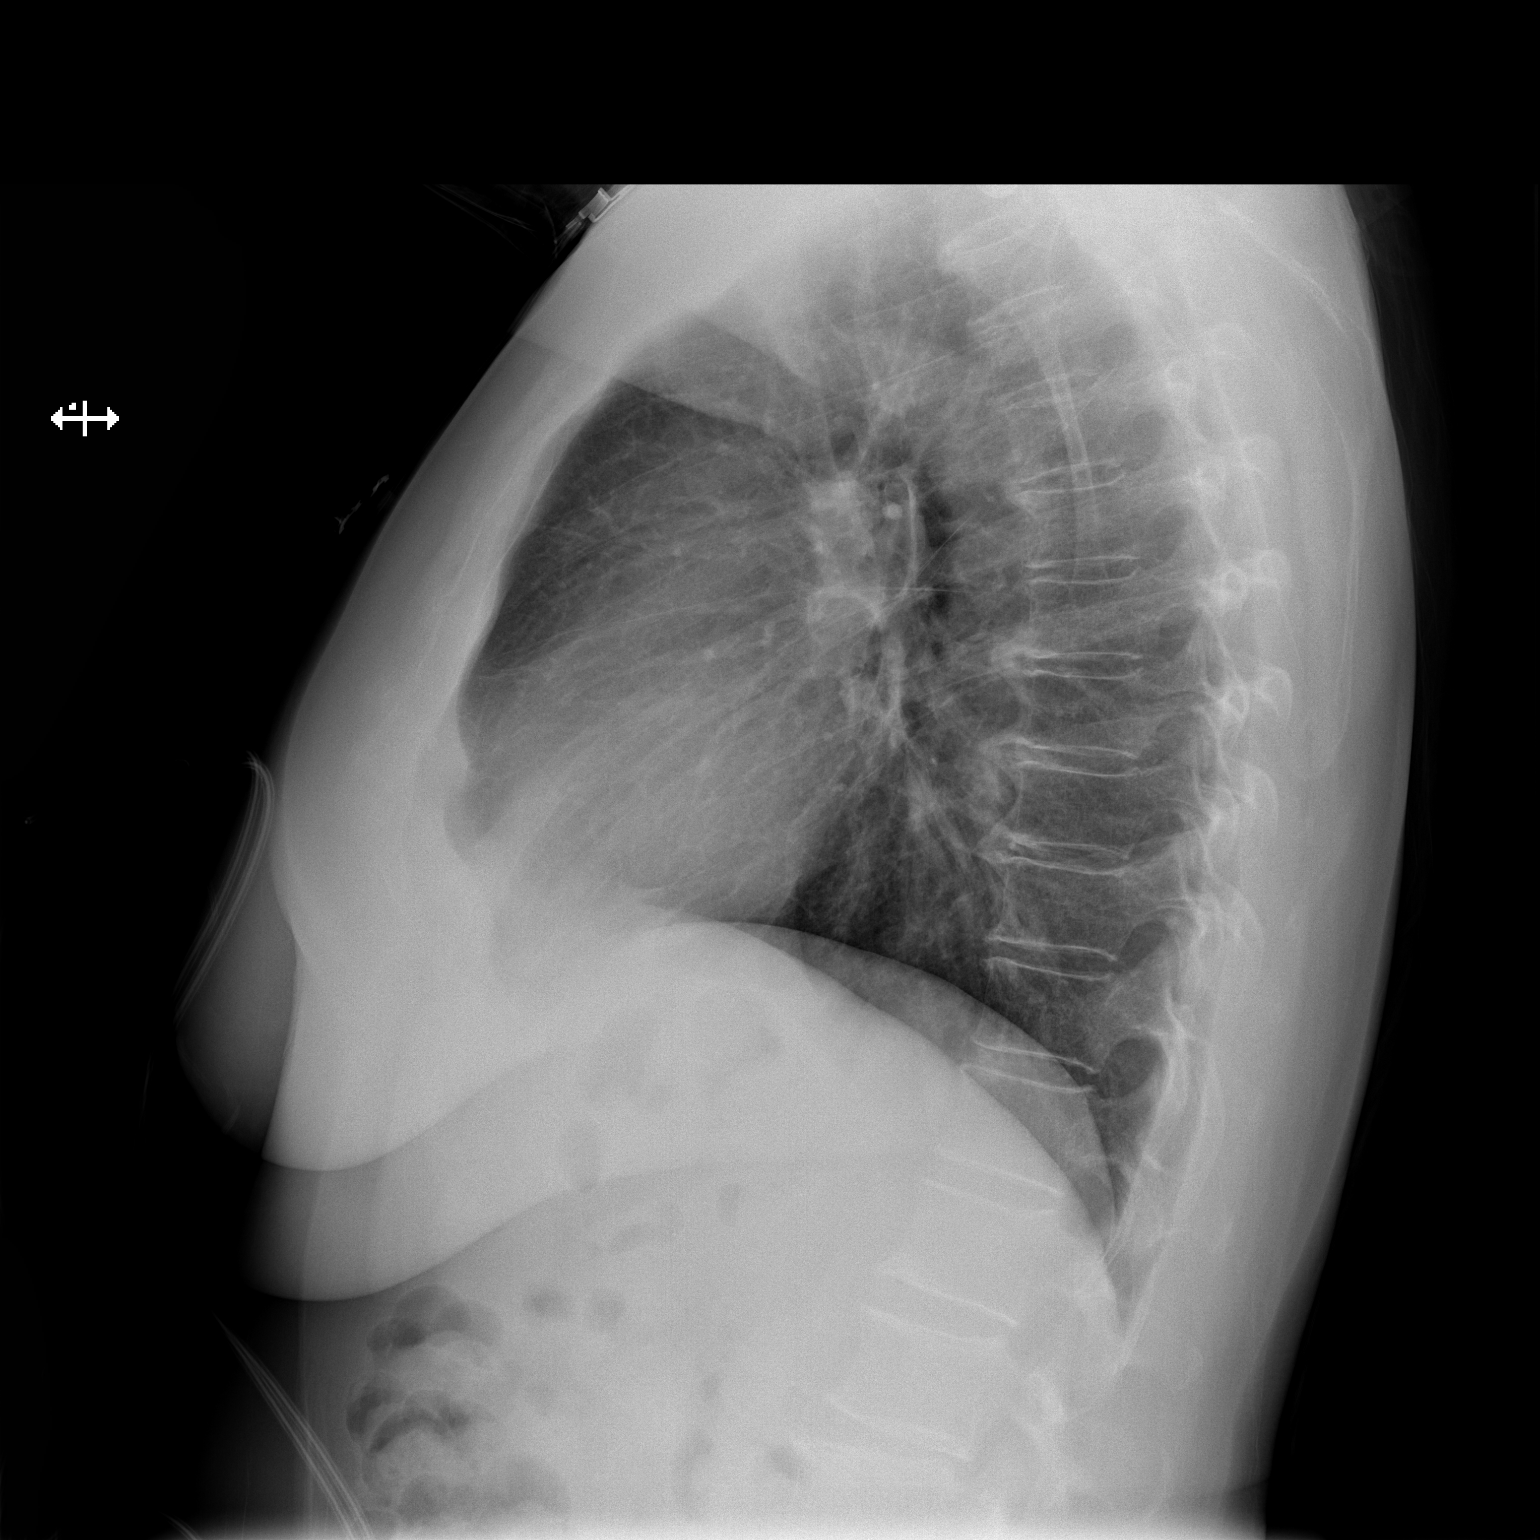

[2 of 2 positions shown; findings below may reference images not displayed]

FINDINGS: Mild aortic tortuosity and aortic arch atherosclerosis,
similar to prior.  Heart size within normal range.  Mediastinal and
hilar contours otherwise within normal limits. Left mid lung
granuloma is unchanged.  No new areas of consolidation.  No pleural
effusion or pneumothorax.  Multilevel degenerative changes.  No
acute osseous finding.
IMPRESSION: No radiographic evidence of acute cardiopulmonary process.

## 2012-10-11 ENCOUNTER — Encounter: Payer: Self-pay | Admitting: Family Medicine

## 2012-10-11 ENCOUNTER — Telehealth: Payer: Self-pay | Admitting: Internal Medicine

## 2012-10-11 ENCOUNTER — Ambulatory Visit: Payer: BC Managed Care – PPO | Admitting: Family Medicine

## 2012-10-11 VITALS — BP 154/80 | HR 63 | Temp 97.5°F | Resp 16 | Ht 64.75 in | Wt 154.2 lb

## 2012-10-11 DIAGNOSIS — R002 Palpitations: Secondary | ICD-10-CM

## 2012-10-11 DIAGNOSIS — Z0000 Encounter for general adult medical examination without abnormal findings: Secondary | ICD-10-CM

## 2012-10-11 DIAGNOSIS — I10 Essential (primary) hypertension: Secondary | ICD-10-CM

## 2012-10-11 LAB — POCT URINALYSIS DIPSTICK
Bilirubin, UA: NEGATIVE
Glucose, UA: NEGATIVE
Ketones, UA: NEGATIVE
Leukocytes, UA: NEGATIVE
Nitrite, UA: NEGATIVE
Protein, UA: NEGATIVE
Spec Grav, UA: 1.01
Urobilinogen, UA: 0.2
pH, UA: 7

## 2012-10-11 LAB — COMPREHENSIVE METABOLIC PANEL
ALT: 16 U/L (ref 0–35)
AST: 18 U/L (ref 0–37)
Albumin: 4.6 g/dL (ref 3.5–5.2)
Alkaline Phosphatase: 79 U/L (ref 39–117)
BUN: 12 mg/dL (ref 6–23)
CO2: 27 mEq/L (ref 19–32)
Calcium: 10.1 mg/dL (ref 8.4–10.5)
Chloride: 96 mEq/L (ref 96–112)
Creat: 0.64 mg/dL (ref 0.50–1.10)
Glucose, Bld: 96 mg/dL (ref 70–99)
Potassium: 3.6 mEq/L (ref 3.5–5.3)
Sodium: 136 mEq/L (ref 135–145)
Total Bilirubin: 0.6 mg/dL (ref 0.3–1.2)
Total Protein: 7.5 g/dL (ref 6.0–8.3)

## 2012-10-11 LAB — TSH: TSH: 1.791 u[IU]/mL (ref 0.350–4.500)

## 2012-10-11 LAB — POCT UA - MICROSCOPIC ONLY
Bacteria, U Microscopic: NEGATIVE
Casts, Ur, LPF, POC: NEGATIVE
Crystals, Ur, HPF, POC: NEGATIVE
Mucus, UA: NEGATIVE
RBC, urine, microscopic: NEGATIVE
WBC, Ur, HPF, POC: NEGATIVE
Yeast, UA: NEGATIVE

## 2012-10-11 LAB — T4, FREE: Free T4: 1.35 ng/dL (ref 0.80–1.80)

## 2012-10-11 NOTE — Telephone Encounter (Signed)
I was called by the patient for elevated blood pressure.  She took all of her normal medications today.  She did eat out at [facility] grill this evening.  Denies NSAIDs use.  Her BP was 171/100 this evening on random check.  She denies chest pain, dyspnea, headache, visual changes or any other symptoms at this time other than feeling a bit anxious.  Looking back, at last phone visit on 3/24 her BP was reasonable on this same regimen.  I advised her to come to the ER if she notices any new symptoms suggestive of end organ damage, specifically the ones listed above.  She understood.  Otherwise, I told her I would relay this information to her doctor for further outpatient BP management advice.  I told her that if her BP continues to go up or if she has any other concerns, she should come to the ER.  She understood.

## 2012-10-11 NOTE — Patient Instructions (Signed)
Take thyroid medicine by itself, never with any other medications or food.  Wait 1 hour after taking the thyroid before taking anything else other than water.

## 2012-10-11 NOTE — Progress Notes (Signed)
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  Her blood pressure went as high as 173 systolic over 90 diastolic (yesterday at 4 pm).  She feels generally bad when the pressure goes up.    She is having no problems with concentration or memory.  Blood pressure this am was 152 systolic, pulse 71.  Some low back pain last 24 hours.  This past week her anterior neck has been achey and uncomfortable.  No recent change in diet.  She does take the thyroid with the metoprolol and was told to always take the thyroid separate from all other medicine and food   Objective:  NAD, alert with good eye contact Results for orders placed in visit on 06/25/12  TSH      Result Value Range   TSH 2.251  0.350 - 4.500 uIU/mL  COMPREHENSIVE METABOLIC PANEL      Result Value Range   Sodium 135  135 - 145 mEq/L   Potassium 3.5  3.5 - 5.3 mEq/L   Chloride 94 (*) 96 - 112 mEq/L   CO2 30  19 - 32 mEq/L   Glucose, Bld 99  70 - 99 mg/dL   BUN 13  6 - 23 mg/dL   Creat 1.61  0.96 - 0.45 mg/dL   Total Bilirubin 0.7  0.3 - 1.2 mg/dL   Alkaline Phosphatase 85  39 - 117 U/L   AST 17  0 - 37 U/L   ALT 15  0 - 35 U/L   Total Protein 7.6  6.0 - 8.3 g/dL   Albumin 4.7  3.5 - 5.2 g/dL   Calcium 40.9  8.4 - 81.1 mg/dL  914/78 HEENT:  Normal TM's, fundi, oroph Neck:  Supple, no thyromegaly or adenopath Chest:clear Heart: regular, no gallop  Abdomen:  Soft, nontender, no HSM or mass EkG  NSR (sinus brady)   Assessment:  Some recent palpitations, blood pressure elevation.  No mental status changes or signs of impending cardiac disease. The blood pressure elevation is not acutely serious or life-threatening, but patient is bothered by these recent elevations in extensive rule out underlying causes. She's already taking the 3 safest medicines for blood pressure and I hesitate to change anything until I'm sure been doing no harm.  Routine general medical examination at a health care  facility - Plan: Ambulatory referral to Gastroenterology  Accelerated hypertension - Plan: Comprehensive metabolic panel, TSH, T4, Free  Palpitations - Plan: EKG 12-Lead, EKG 12-Lead  We will await lab report from today's visit for considering any changes to medication

## 2012-10-13 ENCOUNTER — Encounter: Payer: Self-pay | Admitting: *Deleted

## 2012-10-17 ENCOUNTER — Other Ambulatory Visit: Payer: Self-pay

## 2012-10-17 MED ORDER — FLUTICASONE PROPIONATE 50 MCG/ACT NA SUSP: 2.0000 | Freq: Every day | NASAL | Status: DC

## 2012-10-17 NOTE — Telephone Encounter (Signed)
Pt calling for refill of Flonase. Says she should have received this during her CPE last year but did not.  [facility] on [address]  [phone]  Ok to leave msg per pt

## 2012-10-17 NOTE — Telephone Encounter (Signed)
Pended please advise.  

## 2012-10-24 ENCOUNTER — Encounter: Payer: Self-pay | Admitting: Family Medicine

## 2012-10-24 ENCOUNTER — Telehealth: Payer: Self-pay

## 2012-10-24 ENCOUNTER — Encounter (HOSPITAL_COMMUNITY): Payer: Self-pay | Admitting: Emergency Medicine

## 2012-10-24 ENCOUNTER — Emergency Department (HOSPITAL_COMMUNITY)
Admission: EM | Admit: 2012-10-24 | Discharge: 2012-10-24 | Disposition: A | Discharge status: Home / Self Care | Payer: BC Managed Care – PPO | Attending: Emergency Medicine | Admitting: Emergency Medicine

## 2012-10-24 DIAGNOSIS — R197 Diarrhea, unspecified: Secondary | ICD-10-CM | POA: Insufficient documentation

## 2012-10-24 DIAGNOSIS — E079 Disorder of thyroid, unspecified: Secondary | ICD-10-CM | POA: Insufficient documentation

## 2012-10-24 DIAGNOSIS — R109 Unspecified abdominal pain: Secondary | ICD-10-CM | POA: Insufficient documentation

## 2012-10-24 DIAGNOSIS — R112 Nausea with vomiting, unspecified: Secondary | ICD-10-CM

## 2012-10-24 DIAGNOSIS — Z79899 Other long term (current) drug therapy: Secondary | ICD-10-CM | POA: Insufficient documentation

## 2012-10-24 DIAGNOSIS — E876 Hypokalemia: Secondary | ICD-10-CM | POA: Insufficient documentation

## 2012-10-24 DIAGNOSIS — R002 Palpitations: Secondary | ICD-10-CM | POA: Insufficient documentation

## 2012-10-24 DIAGNOSIS — I10 Essential (primary) hypertension: Secondary | ICD-10-CM | POA: Insufficient documentation

## 2012-10-24 DIAGNOSIS — Z8739 Personal history of other diseases of the musculoskeletal system and connective tissue: Secondary | ICD-10-CM | POA: Insufficient documentation

## 2012-10-24 DIAGNOSIS — Z9851 Tubal ligation status: Secondary | ICD-10-CM | POA: Insufficient documentation

## 2012-10-24 LAB — COMPREHENSIVE METABOLIC PANEL
ALT: 20 U/L (ref 0–35)
AST: 22 U/L (ref 0–37)
Albumin: 4.3 g/dL (ref 3.5–5.2)
Calcium: 10 mg/dL (ref 8.4–10.5)
Sodium: 138 mEq/L (ref 135–145)
Total Protein: 7.9 g/dL (ref 6.0–8.3)

## 2012-10-24 LAB — URINE MICROSCOPIC-ADD ON

## 2012-10-24 LAB — URINALYSIS, ROUTINE W REFLEX MICROSCOPIC
Bilirubin Urine: NEGATIVE
Nitrite: NEGATIVE
Specific Gravity, Urine: 1.019 (ref 1.005–1.030)
pH: 5.5 (ref 5.0–8.0)

## 2012-10-24 LAB — CBC WITH DIFFERENTIAL/PLATELET
Basophils Absolute: 0 10*3/uL (ref 0.0–0.1)
Eosinophils Absolute: 0 10*3/uL (ref 0.0–0.7)
Eosinophils Relative: 0 % (ref 0–5)
MCH: 29.6 pg (ref 26.0–34.0)
MCV: 80.5 fL (ref 78.0–100.0)
Platelets: 252 10*3/uL (ref 150–400)
RDW: 12.8 % (ref 11.5–15.5)
WBC: 12.7 10*3/uL — ABNORMAL HIGH (ref 4.0–10.5)

## 2012-10-24 MED ORDER — POTASSIUM CHLORIDE CRYS ER 20 MEQ PO TBCR
40.0000 meq | EXTENDED_RELEASE_TABLET | Freq: Once | ORAL | Status: AC
  Administered 2012-10-24: 40 meq via ORAL
  Filled 2012-10-24: qty 2

## 2012-10-24 MED ORDER — MAGNESIUM LACTATE 84 MG (7MEQ) PO TBCR
84.0000 mg | EXTENDED_RELEASE_TABLET | Freq: Once | ORAL | Status: AC
  Administered 2012-10-24: 84 mg via ORAL
  Filled 2012-10-24: qty 1

## 2012-10-24 MED ORDER — ONDANSETRON HCL 4 MG PO TABS: 4.0000 mg | ORAL_TABLET | Freq: Three times a day (TID) | ORAL | Status: DC | PRN

## 2012-10-24 MED ORDER — SODIUM CHLORIDE 0.9 % IV BOLUS (SEPSIS)
1000.0000 mL | Freq: Once | INTRAVENOUS | Status: AC
  Administered 2012-10-24: 1000 mL via INTRAVENOUS

## 2012-10-24 MED ORDER — POTASSIUM CHLORIDE ER 20 MEQ PO TBCR: 20.0000 meq | EXTENDED_RELEASE_TABLET | Freq: Two times a day (BID) | ORAL | Status: DC

## 2012-10-24 NOTE — ED Notes (Signed)
ED PA in room with pt 

## 2012-10-24 NOTE — ED Provider Notes (Signed)
History     CSN: [id number]  Arrival date & time 10/24/12  4098   First MD Initiated Contact with Patient 10/24/12 0121      Chief Complaint  Patient presents with  . Palpitations  . Hypertension  . Diarrhea    (Consider location/radiation/quality/duration/timing/severity/associated sxs/prior treatment) HPI  62 year old female presents to the emergency department with multiple complaints.  Chiefly she has had multiple loose stools, abdominal pain, nausea and vomiting for the past 2 days.  Patient also has a history of chronic aortic hypertension with tachycardia.  She has a history of thyroid disease.  She has had extensive outpatient workup for this problem under the supervision of Dr. Dietrich Pates.  The patient states that she has had palpitations and a racing heart for the past 2 days.  She had elevated pressure of 160/95 before coming to the emergency department with a tachycardia to 107.  She said that this was followed by an episode of vomiting.  Patient denies any blood in her stool.  Her husband had similar symptoms of frequent loose stools and abdominal discomfort earlier this week without nausea or vomiting.  Patient denies any fever, chills, ingestion of suspect food, exotic foreign travel.  Patient denies any chest pressure or discomfort Denies DOE, SOB, chest tightness or pressure, radiation to left arm, jaw or back, or diaphoresis. Denies dysuria, flank pain, suprapubic pain, frequency, urgency, or hematuria. Denies headaches, light headedness, weakness, visual disturbances.   Past Medical History  Diagnosis Date  . Hypertension   . Thyroid disease   . Allergy   . Arthritis     Past Surgical History  Procedure Laterality Date  . Total knee arthroplasty    . Breast surgery    . Joint replacement    . Tubal ligation      Family History  Problem Relation Age of Onset  . Hyperlipidemia Mother   . Hypertension Mother   . Heart disease Father   . Diabetes Father   .  Hyperlipidemia Sister   . Hypertension Maternal Grandmother   . Diabetes Paternal Grandmother   . Heart disease Paternal Grandmother   . Heart disease Paternal Grandfather     History  Substance Use Topics  . Smoking status: Never Smoker   . Smokeless tobacco: Not on file  . Alcohol Use: No     Comment: wine once in a while    OB History   Grav Para Term Preterm Abortions TAB SAB Ect Mult Living                  Review of Systems Ten systems reviewed and are negative for acute change, except as noted in the HPI.   Allergies  Shrimp; Asa; Eggs or egg-derived products; Lisinopril; Losartan; Penicillins; and Sudafed  Home Medications   Current Outpatient Rx  Name  Route  Sig  Dispense  Refill  . amLODipine (NORVASC) 10 MG tablet   Oral   Take 1 tablet (10 mg total) by mouth daily.   30 tablet   6   . Azelaic Acid (FINACEA EX)   Apply externally   Apply topically daily.         . cetirizine (ZYRTEC) 10 MG tablet   Oral   Take 10 mg by mouth daily.         . Cholecalciferol (VITAMIN D3 PO)   Oral   Take 1 tablet by mouth daily.         . fluticasone (FLONASE) 50  MCG/ACT nasal spray   Nasal   Place 2 sprays into the nose daily.   16 g   3   . levothyroxine (SYNTHROID, LEVOTHROID) 50 MCG tablet   Oral   Take 1 tablet (50 mcg total) by mouth daily.   90 tablet   3   . metoprolol succinate (TOPROL-XL) 50 MG 24 hr tablet   Oral   Take 50 mg by mouth 2 (two) times daily. Take with or immediately following a meal.         . triamterene-hydrochlorothiazide (MAXZIDE-25) 37.5-25 MG per tablet   Oral   Take 1 each (1 tablet total) by mouth daily.   90 tablet   3     BP 108/63  Pulse 61  Temp(Src) 98.6 F (37 C) (Oral)  Resp 6  SpO2 96%  Physical Exam Physical Exam  Nursing note and vitals reviewed. Constitutional: She is oriented to person, place, and time. She appears well-developed and well-nourished. No distress.  HENT:  Head:  Normocephalic and atraumatic.  Eyes: Conjunctivae normal and EOM are normal. Pupils are equal, round, and reactive to light. No scleral icterus.  Neck: Normal range of motion.  Cardiovascular: Normal rate, regular rhythm and normal heart sounds.  Exam reveals no gallop and no friction rub.   No murmur heard. Pulmonary/Chest: Effort normal and breath sounds normal. No respiratory distress.  Abdominal: Soft. Bowel sounds are normal. She exhibits no distension and no mass. There is no tenderness. There is no guarding.  Neurological: She is alert and oriented to person, place, and time.  Skin: Skin is warm and dry. She is not diaphoretic.  Psych: Anxious, ruminates about symptoms, neurotic and obsessed with detail ED Course  Procedures (including critical care time)  Labs Reviewed  CBC WITH DIFFERENTIAL - Abnormal; Notable for the following:    WBC 12.7 (*)    MCHC 36.8 (*)    Neutrophils Relative 79 (*)    Neutro Abs 10.1 (*)    All other components within normal limits  COMPREHENSIVE METABOLIC PANEL - Abnormal; Notable for the following:    Potassium 2.8 (*)    Chloride 95 (*)    Glucose, Bld 157 (*)    GFR calc non Af Amer 69 (*)    GFR calc Af Amer 80 (*)    All other components within normal limits  URINALYSIS, ROUTINE W REFLEX MICROSCOPIC - Abnormal; Notable for the following:    Hgb urine dipstick SMALL (*)    All other components within normal limits  URINE MICROSCOPIC-ADD ON  POCT I-STAT TROPONIN I   No results found.   1. Hypokalemia   2. Nausea and vomiting   3. Diarrhea       MDM  2:29 AM Patient with chronic issues of palpitation and hypertension.  Her vital signs have been stable she did have one episode of vomiting in the lobby before she was seen by provider.  Show hypokalemia at 2.8.  She is small amount hemoglobin but no other sign of urinary tract infection.  Her white count is elevated.  Negative troponin and EKG is unremarkable.  Again repleting her  potassium with oral potassium and magnesium.  4:24 AM BP 108/63  Pulse 61  Temp(Src) 98.6 F (37 C) (Oral)  Resp 6  SpO2 96% Patient sxs reolved and no unstable vitals at this time.  Will D/C home with PO potassium and f/u with PCP for repeat labs.The patient appears reasonably screened and/or stabilized for discharge and  I doubt any other medical condition or other [facility] requiring further screening, evaluation, or treatment in the ED at this time prior to discharge.       Arthor Captain, PA-C 10/24/12 1826

## 2012-10-24 NOTE — ED Notes (Addendum)
PT. REPORTS PALPITATIONS , HYPERTENSION WITH HOT FLASHES /DIARRHEA , NAUSEA AND GENERALIZED WEAKNESS ONSET TODAY .

## 2012-10-24 NOTE — Telephone Encounter (Signed)
Dr. Milus Glazier:  Patient went to the [facility] ER last night and her potassium is low.  The ER doctor recommended that she get this rechecked by [place] on Thursday.  She wants you to let her know if she can come in and see you since you do not have any appointments open this coming Thursday.  Her number is [phone]

## 2012-10-24 NOTE — Telephone Encounter (Signed)
Have her come to 102 when I am working

## 2012-10-25 NOTE — ED Provider Notes (Signed)
Medical screening examination/treatment/procedure(s) were performed by non-physician practitioner and as supervising physician I was immediately available for consultation/collaboration.   Johnnye Sandford L Rima Blizzard, MD 10/25/12 0616 

## 2012-10-26 ENCOUNTER — Encounter: Payer: Self-pay | Admitting: Internal Medicine

## 2012-10-30 ENCOUNTER — Ambulatory Visit: Payer: BC Managed Care – PPO | Admitting: Family Medicine

## 2012-10-31 ENCOUNTER — Ambulatory Visit: Payer: BC Managed Care – PPO | Admitting: Family Medicine

## 2012-10-31 VITALS — BP 116/72 | HR 59 | Temp 98.6°F | Resp 16 | Ht 64.75 in | Wt 153.0 lb

## 2012-10-31 DIAGNOSIS — T148XXA Other injury of unspecified body region, initial encounter: Secondary | ICD-10-CM

## 2012-10-31 DIAGNOSIS — E876 Hypokalemia: Secondary | ICD-10-CM

## 2012-10-31 LAB — POCT CBC
Granulocyte percent: 68.6 %G (ref 37–80)
Hemoglobin: 15.3 g/dL (ref 12.2–16.2)
Lymph, poc: 1.7 (ref 0.6–3.4)
MCHC: 32.6 g/dL (ref 31.8–35.4)
MPV: 8.9 fL (ref 0–99.8)
POC Granulocyte: 4.7 (ref 2–6.9)
POC MID %: 5.7 %M (ref 0–12)
RBC: 5.31 M/uL (ref 4.04–5.48)

## 2012-10-31 LAB — POTASSIUM: Potassium: 3.9 mEq/L (ref 3.5–5.3)

## 2012-10-31 NOTE — Progress Notes (Signed)
[facility] [address], [place] [address] [phone]  Date:  10/31/2012   Name:  Angelica Levine   DOB:  1950-10-31   MRN:  [id number]  PCP:  Tally Due, MD    Chief Complaint: Hospitalization Follow-up and Foot Pain   History of Present Illness:  Angelica Levine is a 62 y.o. very pleasant female patient who presents with the following:  She was seen in the ED with hypokalemia and hypertension one week ago.  She seemed to have a GI bug which caused dehydration.  They repleated her K in the ED and allowed her to go home.  She needs a recheck of her K today.  She finished her potassium therapy 2 days ago.  She had some loose stools yesterday, but today is doing ok.    She also noted a buise on the dorsum of her left foot for the last 2 or 3 days.  She cannot recall any injury, and it does not hurt except when her shoes rub on the area. It does not hurt to walk.  No other unusual bruises or bleeding  She has been stable on her BP medications or some time and has never been dx with hypokalemia in the past that she can recall  Patient Active Problem List  Diagnosis  . Hypothyroidism  . Allergic rhinitis  . DJD (degenerative joint disease)  . Lipids abnormal  . HTN (hypertension)  . Rosacea    Past Medical History  Diagnosis Date  . Hypertension   . Thyroid disease   . Allergy   . Arthritis     Past Surgical History  Procedure Laterality Date  . Total knee arthroplasty    . Breast surgery    . Joint replacement    . Tubal ligation      History  Substance Use Topics  . Smoking status: Never Smoker   . Smokeless tobacco: Not on file  . Alcohol Use: No     Comment: wine once in a while    Family History  Problem Relation Age of Onset  . Hyperlipidemia Mother   . Hypertension Mother   . Heart disease Father   . Diabetes Father   . Hyperlipidemia Sister   . Hypertension Maternal Grandmother   . Diabetes Paternal Grandmother   .  Heart disease Paternal Grandmother   . Heart disease Paternal Grandfather     Allergies  Allergen Reactions  . Shrimp (Shellfish Allergy) Anaphylaxis  . Asa (Aspirin) Other (See Comments)  . Eggs Or Egg-Derived Products Other (See Comments)    Reaction unknown  . Lisinopril Cough  . Losartan   . Penicillins Hives  . Sudafed (Pseudoephedrine Hcl) Hypertension    Medication list has been reviewed and updated.  Current Outpatient Prescriptions on File Prior to Visit  Medication Sig Dispense Refill  . amLODipine (NORVASC) 10 MG tablet Take 1 tablet (10 mg total) by mouth daily.  30 tablet  6  . Azelaic Acid (FINACEA EX) Apply topically daily.      . cetirizine (ZYRTEC) 10 MG tablet Take 10 mg by mouth daily.      . fluticasone (FLONASE) 50 MCG/ACT nasal spray Place 2 sprays into the nose daily.  16 g  3  . levothyroxine (SYNTHROID, LEVOTHROID) 50 MCG tablet Take 1 tablet (50 mcg total) by mouth daily.  90 tablet  3  . metoprolol succinate (TOPROL-XL) 50 MG 24 hr tablet Take 50 mg by mouth 2 (  two) times daily. Take with or immediately following a meal.      . triamterene-hydrochlorothiazide (MAXZIDE-25) 37.5-25 MG per tablet Take 1 each (1 tablet total) by mouth daily.  90 tablet  3  . Cholecalciferol (VITAMIN D3 PO) Take 1 tablet by mouth daily.      . ondansetron (ZOFRAN) 4 MG tablet Take 1 tablet (4 mg total) by mouth every 8 (eight) hours as needed for nausea.  10 tablet  0  . potassium chloride 20 MEQ TBCR Take 20 mEq by mouth 2 (two) times daily.  10 tablet  0   No current facility-administered medications on file prior to visit.    Review of Systems:  As per HPI- otherwise negative.   Physical Examination: Filed Vitals:   10/31/12 1337  BP: 116/72  Pulse: 59  Temp: 98.6 F (37 C)  Resp: 16   Filed Vitals:   10/31/12 1337  Height: 5' 4.75" (1.645 m)  Weight: 153 lb (69.4 kg)   Body mass index is 25.65 kg/(m^2). Ideal Body Weight: Weight in (lb) to have BMI = 25:  148.8  GEN: WDWN, NAD, Non-toxic, A & O x 3 HEENT: Atraumatic, Normocephalic. Neck supple. No masses, No LAD. Ears and Nose: No external deformity. CV: RRR, No M/G/R. No JVD. No thrill. No extra heart sounds. PULM: CTA B, no wheezes, crackles, rhonchi. No retractions. No resp. distress. No accessory muscle use. EXTR: No c/c/e NEURO Normal gait.  PSYCH: Normally interactive. Conversant. Not depressed or anxious appearing.  Calm demeanor.  Left foot: there is a silver dollar sized faint bruise over the dorsum of the foot.  It is slightly tender.  Otherwise foot and ankle are normal, no bony tenderness   Results for orders placed in visit on 10/31/12  POTASSIUM      Result Value Range   Potassium 3.9  3.5 - 5.3 mEq/L  POCT CBC      Result Value Range   WBC 6.8  4.6 - 10.2 K/uL   Lymph, poc 1.7  0.6 - 3.4   POC LYMPH PERCENT 25.7  10 - 50 %L   MID (cbc) 0.4  0 - 0.9   POC MID % 5.7  0 - 12 %M   POC Granulocyte 4.7  2 - 6.9   Granulocyte percent 68.6  37 - 80 %G   RBC 5.31  4.04 - 5.48 M/uL   Hemoglobin 15.3  12.2 - 16.2 g/dL   HCT, POC 16.1  09.6 - 47.9 %   MCV 88.3  80 - 97 fL   MCH, POC 28.8  27 - 31.2 pg   MCHC 32.6  31.8 - 35.4 g/dL   RDW, POC 04.5     Platelet Count, POC 317  142 - 424 K/uL   MPV 8.9  0 - 99.8 fL    Assessment and Plan: Hypokalemia - Plan: Potassium  Bruising - Plan: POCT CBC  Await K level and will follow- up with her.  If still low may need to decrease her diuretic dose.   CBC is normal- bruise likely due to unnoticed injury, but she will report any other unusual bruises or bleeding  Signed Abbe Amsterdam, MD

## 2012-10-31 NOTE — Addendum Note (Signed)
Addended by: Abbe Amsterdam C on: 10/31/2012 09:54 PM   Modules accepted: Orders

## 2012-11-12 ENCOUNTER — Other Ambulatory Visit: Payer: BC Managed Care – PPO | Admitting: Family Medicine

## 2012-11-12 ENCOUNTER — Telehealth: Payer: Self-pay

## 2012-11-12 DIAGNOSIS — E559 Vitamin D deficiency, unspecified: Secondary | ICD-10-CM

## 2012-11-12 DIAGNOSIS — E876 Hypokalemia: Secondary | ICD-10-CM

## 2012-11-12 DIAGNOSIS — E785 Hyperlipidemia, unspecified: Secondary | ICD-10-CM

## 2012-11-12 LAB — LIPID PANEL
Cholesterol: 236 mg/dL — ABNORMAL HIGH (ref 0–200)
HDL: 45 mg/dL (ref >39)
LDL Cholesterol: 133 mg/dL — ABNORMAL HIGH (ref 0–99)
Total CHOL/HDL Ratio: 5.2 Ratio
Triglycerides: 288 mg/dL — ABNORMAL HIGH (ref <150)
VLDL: 58 mg/dL — ABNORMAL HIGH (ref 0–40)

## 2012-11-12 NOTE — Telephone Encounter (Signed)
Patient wants to know if an order has been placed by Dr. Patsy Lager for blood work.  She does not want to wait a long time to have blood draw.   [phone]

## 2012-11-13 ENCOUNTER — Ambulatory Visit: Payer: BC Managed Care – PPO | Admitting: Family Medicine

## 2012-11-13 LAB — VITAMIN D 25 HYDROXY (VIT D DEFICIENCY, FRACTURES): Vit D, 25-Hydroxy: 31 ng/mL (ref 30–89)

## 2012-11-17 ENCOUNTER — Ambulatory Visit (INDEPENDENT_AMBULATORY_CARE_PROVIDER_SITE_OTHER): Payer: BC Managed Care – PPO | Admitting: Family Medicine

## 2012-11-17 ENCOUNTER — Encounter: Payer: Self-pay | Admitting: Family Medicine

## 2012-11-17 VITALS — BP 106/66 | HR 59 | Temp 98.0°F | Resp 16 | Ht 64.5 in | Wt 155.4 lb

## 2012-11-17 DIAGNOSIS — I10 Essential (primary) hypertension: Secondary | ICD-10-CM

## 2012-11-17 DIAGNOSIS — E039 Hypothyroidism, unspecified: Secondary | ICD-10-CM

## 2012-11-17 DIAGNOSIS — N6002 Solitary cyst of left breast: Secondary | ICD-10-CM

## 2012-11-17 DIAGNOSIS — E785 Hyperlipidemia, unspecified: Secondary | ICD-10-CM

## 2012-11-17 DIAGNOSIS — Z0000 Encounter for general adult medical examination without abnormal findings: Secondary | ICD-10-CM

## 2012-11-17 DIAGNOSIS — J302 Other seasonal allergic rhinitis: Secondary | ICD-10-CM

## 2012-11-17 LAB — POCT URINALYSIS DIPSTICK
Bilirubin, UA: NEGATIVE
Glucose, UA: NEGATIVE
Ketones, UA: NEGATIVE
Leukocytes, UA: NEGATIVE
Nitrite, UA: NEGATIVE
Protein, UA: NEGATIVE
Spec Grav, UA: 1.01
Urobilinogen, UA: 0.2
pH, UA: 7

## 2012-11-17 LAB — CBC WITH DIFFERENTIAL/PLATELET
Basophils Absolute: 0 10*3/uL (ref 0.0–0.1)
Basophils Relative: 0 % (ref 0–1)
Eosinophils Absolute: 0.1 10*3/uL (ref 0.0–0.7)
Eosinophils Relative: 1 % (ref 0–5)
HCT: 41.3 % (ref 36.0–46.0)
Hemoglobin: 14.5 g/dL (ref 12.0–15.0)
Lymphocytes Relative: 30 % (ref 12–46)
Lymphs Abs: 2.1 10*3/uL (ref 0.7–4.0)
MCH: 29.3 pg (ref 26.0–34.0)
MCHC: 35.1 g/dL (ref 30.0–36.0)
MCV: 83.4 fL (ref 78.0–100.0)
Monocytes Absolute: 0.5 10*3/uL (ref 0.1–1.0)
Monocytes Relative: 8 % (ref 3–12)
Neutro Abs: 4.2 10*3/uL (ref 1.7–7.7)
Neutrophils Relative %: 61 % (ref 43–77)
Platelets: 275 10*3/uL (ref 150–400)
RBC: 4.95 MIL/uL (ref 3.87–5.11)
RDW: 13.6 % (ref 11.5–15.5)
WBC: 6.9 10*3/uL (ref 4.0–10.5)

## 2012-11-17 LAB — TSH: TSH: 1.999 u[IU]/mL (ref 0.350–4.500)

## 2012-11-17 LAB — LIPID PANEL
Cholesterol: 238 mg/dL — ABNORMAL HIGH (ref 0–200)
HDL: 45 mg/dL (ref >39)
LDL Cholesterol: 159 mg/dL — ABNORMAL HIGH (ref 0–99)
Total CHOL/HDL Ratio: 5.3 Ratio
Triglycerides: 170 mg/dL — ABNORMAL HIGH (ref <150)
VLDL: 34 mg/dL (ref 0–40)

## 2012-11-17 LAB — COMPREHENSIVE METABOLIC PANEL
ALT: 22 U/L (ref 0–35)
AST: 22 U/L (ref 0–37)
Albumin: 4.4 g/dL (ref 3.5–5.2)
Alkaline Phosphatase: 87 U/L (ref 39–117)
BUN: 12 mg/dL (ref 6–23)
CO2: 26 mEq/L (ref 19–32)
Calcium: 9.8 mg/dL (ref 8.4–10.5)
Chloride: 102 mEq/L (ref 96–112)
Creat: 0.55 mg/dL (ref 0.50–1.10)
Glucose, Bld: 88 mg/dL (ref 70–99)
Potassium: 3.7 mEq/L (ref 3.5–5.3)
Sodium: 138 mEq/L (ref 135–145)
Total Bilirubin: 0.7 mg/dL (ref 0.3–1.2)
Total Protein: 7.4 g/dL (ref 6.0–8.3)

## 2012-11-17 MED ORDER — METOPROLOL SUCCINATE ER 50 MG PO TB24: 50.0000 mg | ORAL_TABLET | Freq: Two times a day (BID) | ORAL | Status: DC

## 2012-11-17 MED ORDER — LEVOTHYROXINE SODIUM 50 MCG PO TABS: 50.0000 ug | ORAL_TABLET | Freq: Every day | ORAL | Status: DC

## 2012-11-17 MED ORDER — FLUTICASONE PROPIONATE 50 MCG/ACT NA SUSP: 2.0000 | Freq: Every day | NASAL | Status: DC

## 2012-11-17 MED ORDER — AMLODIPINE BESYLATE 10 MG PO TABS: 10.0000 mg | ORAL_TABLET | Freq: Every day | ORAL | Status: DC

## 2012-11-17 NOTE — Patient Instructions (Addendum)

## 2012-11-17 NOTE — Progress Notes (Signed)
Patient ID: Angelica Levine MRN: [id number], DOB: 1950/08/26, 62 y.o. Date of Encounter: 11/17/2012, 9:29 AM  Primary Physician: Tally Due, MD  Chief Complaint: Physical (CPE)  HPI: 62 y.o. y/o female with history of noted below here for CPE.   Retired Teacher, early years/pre. Married Doing well. Planted garden yesterday and is tired today. Children:  2 boys [facility] [place] and [place]) Colonoscopy May 21  LMP:  postmenopausal  Review of Systems: Consitutional: No fever, chills, fatigue, night sweats, lymphadenopathy, or weight changes. Eyes: No visual changes, eye redness, or discharge. ENT/Mouth: Ears: No otalgia, tinnitus, hearing loss, discharge. Nose: No congestion, rhinorrhea, sinus pain, or epistaxis. Throat: No sore throat, post nasal drip, or teeth pain. Cardiovascular: No CP, palpitations, diaphoresis, DOE, edema, orthopnea, PND. Respiratory: No cough, hemoptysis, SOB, or wheezing. Gastrointestinal: No anorexia, dysphagia, reflux, pain, nausea, vomiting, hematemesis, diarrhea, constipation, BRBPR, or melena. Breast: No discharge, pain, swelling, or mass. Genitourinary: No dysuria, frequency, urgency, hematuria, incontinence, nocturia, amenorrhea, vaginal discharge, pruritis, burning, abnormal bleeding, or pain. Musculoskeletal: No decreased ROM, myalgias, stiffness, joint swelling, or weakness. Skin: No rash, erythema, lesion changes, pain, warmth, jaundice, or pruritis. Neurological: No headache, dizziness, syncope, seizures, tremors, memory loss, coordination problems, or paresthesias. Psychological: No anxiety, depression, hallucinations, SI/HI. Endocrine: No fatigue, polydipsia, polyphagia, polyuria, or known diabetes. All other systems were reviewed and are otherwise negative.  Past Medical History  Diagnosis Date  . Hypertension   . Thyroid disease   . Allergy   . Arthritis      Past Surgical History  Procedure Laterality Date  . Total knee arthroplasty    .  Breast surgery    . Joint replacement    . Tubal ligation      Home Meds:  Prior to Admission medications   Medication Sig Start Date End Date Taking? Authorizing Provider  amLODipine (NORVASC) 10 MG tablet Take 1 tablet (10 mg total) by mouth daily. 06/10/12  Yes Pricilla Riffle, MD  Azelaic Acid [facility] EX) Apply topically daily.   Yes Historical Provider, MD  cetirizine (ZYRTEC) 10 MG tablet Take 10 mg by mouth daily.   Yes Historical Provider, MD  fluticasone (FLONASE) 50 MCG/ACT nasal spray Place 2 sprays into the nose daily. 10/17/12  Yes Anders Simmonds, PA-C  levothyroxine (SYNTHROID, LEVOTHROID) 50 MCG tablet Take 1 tablet (50 mcg total) by mouth daily. 06/27/12  Yes Elvina Sidle, MD  metoprolol succinate (TOPROL-XL) 50 MG 24 hr tablet Take 50 mg by mouth 2 (two) times daily. Take with or immediately following a meal.   Yes Historical Provider, MD  triamterene-hydrochlorothiazide (MAXZIDE-25) 37.5-25 MG per tablet Take 1 each (1 tablet total) by mouth daily. 02/21/12 02/20/13 Yes Elvina Sidle, MD  Cholecalciferol (VITAMIN D3 PO) Take 1 tablet by mouth daily.    Historical Provider, MD  ondansetron (ZOFRAN) 4 MG tablet Take 1 tablet (4 mg total) by mouth every 8 (eight) hours as needed for nausea. 10/24/12   Arthor Captain, PA-C  potassium chloride 20 MEQ TBCR Take 20 mEq by mouth 2 (two) times daily. 10/24/12   Arthor Captain, PA-C    Allergies:  Allergies  Allergen Reactions  . Shrimp (Shellfish Allergy) Anaphylaxis  . Asa (Aspirin) Other (See Comments)  . Eggs Or Egg-Derived Products Other (See Comments)    Reaction unknown  . Lisinopril Cough  . Losartan   . Penicillins Hives  . Sudafed (Pseudoephedrine Hcl) Hypertension    History   Social History  . Marital Status: Married  Spouse Name: N/A    Number of Children: N/A  . Years of Education: N/A   Occupational History  . Not on file.   Social History Main Topics  . Smoking status: Never Smoker   . Smokeless  tobacco: Not on file  . Alcohol Use: No     Comment: wine once in a while  . Drug Use: No  . Sexually Active: Yes   Other Topics Concern  . Not on file   Social History Narrative  . No narrative on file    Family History  Problem Relation Age of Onset  . Hyperlipidemia Mother   . Hypertension Mother   . Heart disease Father   . Diabetes Father   . Hyperlipidemia Sister   . Hypertension Maternal Grandmother   . Diabetes Paternal Grandmother   . Heart disease Paternal Grandmother   . Heart disease Paternal Grandfather     Physical Exam: Blood pressure 106/66, pulse 59, temperature 98 F (36.7 C), temperature source Oral, resp. rate 16, height 5' 4.5" (1.638 m), weight 155 lb 6.4 oz (70.489 kg), SpO2 98.00%., Body mass index is 26.27 kg/(m^2). General: Well developed, well nourished, in no acute distress. HEENT: Normocephalic, atraumatic. Conjunctiva pink, sclera non-icteric. Pupils 2 mm constricting to 1 mm, round, regular, and equally reactive to light and accomodation. EOMI. Internal auditory canal clear. TMs with good cone of light and without pathology. Nasal mucosa pink. Nares are without discharge. No sinus tenderness. Oral mucosa pink. Dentition normal (Dr. Minor). Pharynx without exudate.   Neck: Supple. Trachea midline. No thyromegaly. Full ROM. No lymphadenopathy. Lungs: Clear to auscultation bilaterally without wheezes, rales, or rhonchi. Breathing is of normal effort and unlabored. Cardiovascular: RRR with S1 S2. No murmurs, rubs, or gallops appreciated. Distal pulses 2+ symmetrically. No carotid or abdominal bruits. Breast: Symmetrical. No masses. Nipples without discharge. Abdomen: Soft, non-tender, non-distended with normoactive bowel sounds. No hepatosplenomegaly or masses. No rebound/guarding. No CVA tenderness. Without hernias.  Musculoskeletal: Full range of motion and 5/5 strength throughout. Without swelling, atrophy, tenderness, crepitus, or warmth. Extremities  without clubbing, cyanosis, or edema. Calves supple.  Moderate Heberden's type nodes both hands Skin: Warm and moist without erythema, ecchymosis, wounds, or rash.  Scab left sideburn Neuro: A+Ox3. CN II-XII grossly intact. Moves all extremities spontaneously. Full sensation throughout. Normal gait. DTR 2+ throughout upper and lower extremities. Finger to nose intact. Psych:  Responds to questions appropriately with a normal affect.   UA:   Assessment/Plan:  62 y.o. y/o female here for CPE -  Signed, Elvina Sidle, MD 11/17/2012 9:29 AM

## 2012-11-17 NOTE — Progress Notes (Signed)
  Subjective:    Patient ID: Angelica Levine, female    DOB: [birth year], 62 y.o.   MRN: [id number]  HPI    Review of Systems  Constitutional: Negative.   HENT: Negative.   Eyes: Negative.   Respiratory: Negative.   Cardiovascular: Negative.   Gastrointestinal: Negative.   Endocrine: Negative.   Genitourinary: Negative.   Musculoskeletal: Negative.   Skin: Negative.   Allergic/Immunologic: Negative.   Neurological: Negative.   Hematological: Negative.   Psychiatric/Behavioral: Negative.        Objective:   Physical Exam        Assessment & Plan:

## 2012-11-18 ENCOUNTER — Encounter: Payer: Self-pay | Admitting: Family Medicine

## 2012-11-19 ENCOUNTER — Other Ambulatory Visit: Payer: Self-pay | Admitting: Family Medicine

## 2012-11-19 DIAGNOSIS — I10 Essential (primary) hypertension: Secondary | ICD-10-CM

## 2012-11-19 DIAGNOSIS — J302 Other seasonal allergic rhinitis: Secondary | ICD-10-CM

## 2012-11-19 DIAGNOSIS — E039 Hypothyroidism, unspecified: Secondary | ICD-10-CM

## 2012-11-19 MED ORDER — LEVOTHYROXINE SODIUM 50 MCG PO TABS: 50.0000 ug | ORAL_TABLET | Freq: Every day | ORAL | Status: DC

## 2012-11-19 MED ORDER — AMLODIPINE BESYLATE 10 MG PO TABS: 10.0000 mg | ORAL_TABLET | Freq: Every day | ORAL | Status: DC

## 2012-11-19 MED ORDER — METOPROLOL SUCCINATE ER 50 MG PO TB24: 50.0000 mg | ORAL_TABLET | Freq: Two times a day (BID) | ORAL | Status: DC

## 2012-11-19 MED ORDER — FLUTICASONE PROPIONATE 50 MCG/ACT NA SUSP: 2.0000 | Freq: Every day | NASAL | Status: DC

## 2012-11-24 ENCOUNTER — Other Ambulatory Visit: Payer: Self-pay | Admitting: Family Medicine

## 2012-11-24 ENCOUNTER — Encounter: Payer: Self-pay | Admitting: Family Medicine

## 2012-11-24 DIAGNOSIS — I10 Essential (primary) hypertension: Secondary | ICD-10-CM

## 2012-11-24 MED ORDER — SPIRONOLACTONE 25 MG PO TABS: 25.0000 mg | ORAL_TABLET | Freq: Every day | ORAL | Status: DC

## 2012-11-28 ENCOUNTER — Encounter: Payer: Self-pay | Admitting: Family Medicine

## 2012-12-01 ENCOUNTER — Telehealth: Payer: Self-pay | Admitting: Internal Medicine

## 2012-12-01 ENCOUNTER — Encounter: Payer: Self-pay | Admitting: Family Medicine

## 2012-12-01 NOTE — Telephone Encounter (Signed)
Pt saw Dr. Milus Glazier for annual physical in early May.  MD stopped Maxzide and started pt on Spironolactone 25mg  daily.  Since then pat has has significant lower extremity swelling daily that is somewhat relieved with elevation.  Denies cough, sob, or cp.  Pt does take Amlodipine 10mg  daily.  Will forward to Dr. Tenny Craw.

## 2012-12-01 NOTE — Telephone Encounter (Signed)
New problem   Pt is having problems with both feet swelling. Please call pt

## 2012-12-04 NOTE — Telephone Encounter (Signed)
Per Dr. Tenny Craw:  Reduce Amlodipine to 5mg  daily.  Wear support hose while awake.  Keep a blood pressure log and call the office with your reading in 10-14 days.    Pt notified of above.

## 2012-12-15 ENCOUNTER — Telehealth: Payer: Self-pay | Admitting: Internal Medicine

## 2012-12-15 NOTE — Telephone Encounter (Signed)
New Prob      Pt had to call Dr on call yesterday due to her BP being so high. Would like to speak to nurse regarding this.

## 2012-12-15 NOTE — Telephone Encounter (Signed)
Keep on with additional toprol   Call in with BP on Friday  In office that day

## 2012-12-15 NOTE — Telephone Encounter (Signed)
Pt reports blood pressure has been creeping up since reducing her Amlodipine to 5mg  daily on 12/04/2012.  Blood pressure last pm was 143/101, pulse 62;  173/99, 87;  177/110, 80.  Advised by Dr. Mayford Knife, after hours on call physician to take 25mg  of Toprol XL.  BP today ay 11:00am was 144/73, 65.  Will forward to Dr. Tenny Craw for review.

## 2012-12-16 ENCOUNTER — Other Ambulatory Visit: Payer: Self-pay | Admitting: Nurse Practitioner

## 2012-12-16 ENCOUNTER — Telehealth: Payer: Self-pay | Admitting: Cardiology

## 2012-12-16 DIAGNOSIS — I10 Essential (primary) hypertension: Secondary | ICD-10-CM

## 2012-12-16 MED ORDER — METOPROLOL SUCCINATE ER 50 MG PO TB24: ORAL_TABLET | ORAL | Status: DC

## 2012-12-16 NOTE — Telephone Encounter (Signed)
Spoke with patient to give her Dr. Charlott Rakes instructions.  I advised patient that Dr. Tenny Craw wants her to continue to take an extra 1/2 tab Toprol XL in the AM for a total of 75 mg in the am and 50 mg in the pm.  Patient verbalized understanding and will call Friday to report her blood pressures.  BP has been: 133/73, 57 @ 9:30 pm on 6/2 and 154/83, 61 @ 11:51 pm.  Today at 0700 BP is 125/64, HR 52.

## 2012-12-16 NOTE — Telephone Encounter (Signed)
Cardiology Night coverage Called due to patient's concern of rising blood pressure during the course of the day. Up to 168/104 by her measurement. Recently has had multiple changes in BP regimen including discontinuation of HCTZ, addition of spironlactone, halving amlodipine and increasing toprol (just done today). No emergent symptoms of headache, vision changes, or chest pain. Recommended pt seek emergent care if these symptoms develop. Otherwise, pt to follow up with Dr. Tenny Craw with BP measurements as recommended. Will cc Dr. Tenny Craw as [place]ain Childes.

## 2012-12-16 NOTE — Progress Notes (Signed)
Medication change documented

## 2012-12-17 ENCOUNTER — Encounter: Payer: Self-pay | Admitting: Family Medicine

## 2012-12-17 ENCOUNTER — Encounter: Payer: Self-pay | Admitting: Internal Medicine

## 2012-12-17 NOTE — Telephone Encounter (Signed)
Should come in for BMET Have her bring BP cuff for calibration.  Check BP at time.

## 2012-12-19 ENCOUNTER — Encounter: Payer: Self-pay | Admitting: Family Medicine

## 2012-12-19 NOTE — Telephone Encounter (Signed)
New Prob     Calling to report BP:  June 3- 125/64 52 pulse (7 am) / 123/68 58 pulse (1130 am) / 132/74 60 pulse (630 pm) / 168/104 71 pulse (1045 pm) / 154/80 67 pulse (1130 pm) June 4- 117/62 52 pulse (7 am) / 136/73 63 pulse (11 pm) June 5- 131/73 61 pulse (730 am) / 143/87 69 pulse (noon) / 134/73 58 pulse (115 pm) / 131/72 59 pulse (815 pm) June 6- 145/82 64 pulse (545 am) /125/72 62 pulse (815 am) / 140/72 62 pulse (915 am)    Would like to speak to nurse.

## 2012-12-19 NOTE — Telephone Encounter (Signed)
Will forward to Dr. Ross for review. 

## 2012-12-22 ENCOUNTER — Ambulatory Visit (INDEPENDENT_AMBULATORY_CARE_PROVIDER_SITE_OTHER): Payer: BC Managed Care – PPO | Admitting: Internal Medicine

## 2012-12-22 ENCOUNTER — Ambulatory Visit (INDEPENDENT_AMBULATORY_CARE_PROVIDER_SITE_OTHER): Payer: BC Managed Care – PPO | Admitting: *Deleted

## 2012-12-22 ENCOUNTER — Telehealth: Payer: Self-pay | Admitting: Internal Medicine

## 2012-12-22 ENCOUNTER — Other Ambulatory Visit: Payer: Self-pay | Admitting: *Deleted

## 2012-12-22 VITALS — BP 152/77 | HR 66 | Resp 18 | Wt 156.0 lb

## 2012-12-22 DIAGNOSIS — I10 Essential (primary) hypertension: Secondary | ICD-10-CM

## 2012-12-22 LAB — BASIC METABOLIC PANEL
BUN: 13 mg/dL (ref 6–23)
CO2: 28 mEq/L (ref 19–32)
Calcium: 9.7 mg/dL (ref 8.4–10.5)
Chloride: 103 mEq/L (ref 96–112)
Creatinine, Ser: 0.6 mg/dL (ref 0.4–1.2)
GFR: 107.14 mL/min (ref >60.00)
Glucose, Bld: 89 mg/dL (ref 70–99)
Potassium: 4.1 mEq/L (ref 3.5–5.1)
Sodium: 138 mEq/L (ref 135–145)

## 2012-12-22 NOTE — Progress Notes (Signed)
Patient arrived for BP check. Her machine read BP of 150/75 with a HR of 67. Clinic readings showed BP of 152/77 and HR 66. Patients states her BP this morning at home was 145/88. She is to have a BMET today. Advised patient that after Dr. Tenny Craw reviews her lab results and BP readings, she will receive a phone call with further instructions. Advised to stay on the same medications as of now.

## 2012-12-22 NOTE — Telephone Encounter (Signed)
Per Dr. Tenny Craw:  Patient needs to come in today for a nurse visit for BP check and BMET.  Pt is to bring her cuff from home for [place] to calibrate.  Labs ordered and added to [facility] schedule and the nurse schedule.

## 2012-12-22 NOTE — Telephone Encounter (Signed)
Add 1/2 37/5/25 of Maxzide to regimen Check BP and BMET in 1 wk in clinic

## 2012-12-22 NOTE — Telephone Encounter (Signed)
To Dr. Ross  

## 2012-12-22 NOTE — Telephone Encounter (Signed)
New problem    B/p went up yesterday 190/116-after visit to hospital it went down but continues to go back up and patient wants to know if she needs to come in today

## 2012-12-22 NOTE — Telephone Encounter (Signed)
New Problem  Pt states her BP keeps going up, she said she just took it and it was 164/86 with a pulse of 65.

## 2012-12-23 NOTE — Telephone Encounter (Signed)
New Prob     Calling in following up on some changes regarding her BP medication. Please call.

## 2012-12-23 NOTE — Telephone Encounter (Signed)
Spoke with patient who called to follow up on Dr. Charlott Rakes recommendation for her blood pressure.  I advised patient that Dr. Tenny Craw ordered her to add 1/2 tab of Maxzide 37.5/25 mg QD to her medication therapy.  The patient verbalized understanding.  I advised patient that Dr. Tenny Craw has also ordered BMET and BP check in one week.  I scheduled the patient for these on 6/16 @ 0840 and 0900 per her request.  BMET ordered.

## 2012-12-24 ENCOUNTER — Other Ambulatory Visit: Payer: Self-pay | Admitting: *Deleted

## 2012-12-24 DIAGNOSIS — I10 Essential (primary) hypertension: Secondary | ICD-10-CM

## 2012-12-24 MED ORDER — METOPROLOL SUCCINATE ER 50 MG PO TB24: ORAL_TABLET | ORAL | Status: DC

## 2012-12-24 NOTE — Telephone Encounter (Signed)
New Problem ° °Pt has a question about her prescription for TOPROL-XL.  She said that it was not enough meds sent in for a 90 days supply. She said she only got a 72 day supply.  °

## 2012-12-24 NOTE — Telephone Encounter (Signed)
Rx resubmitted with correct count.

## 2012-12-24 NOTE — Telephone Encounter (Signed)
New Problem  Pt has a question about her prescription for TOPROL-XL.  She said that it was not enough meds sent in for a 90 days supply. She said she only got a 72 day supply.

## 2012-12-25 ENCOUNTER — Telehealth: Payer: Self-pay | Admitting: Internal Medicine

## 2012-12-25 NOTE — Telephone Encounter (Signed)
New problem    C/O blood pressure @ 11:56 .  157/90 . 76 pulse .  Patient was told to call in if B/p over a certain number. Please advise.

## 2012-12-25 NOTE — Telephone Encounter (Signed)
Spoke with patient. Pt calling because she is concerned about BP today. Today at 1 PM BP 154/88 P 62 and at 2PM today  BP 152/91 P 63. Pt states BP yesterday morning at 6 AM BP 120/70 P 58. Pt did start 1/2 of a Maxzide 37.5/25  tablet 2 days ago. She is asking if she should continue spironolactone in addition to 1/2 of a Maxzide. She is also asking if other adjustments should be made in  her medication given BP readings today. Pt is aware that I am forwarding  to Dr Tenny Craw for review and recommendations.

## 2012-12-25 NOTE — Telephone Encounter (Signed)
Will forward to Triage pool.  Spoke with Katina Dung, RN.

## 2012-12-26 NOTE — Telephone Encounter (Signed)
I would keep on both. It will take several days/few wks to see full effect from meds. SHould be set up for BP check and BMET next wk  Bring diary.

## 2012-12-26 NOTE — Telephone Encounter (Signed)
Pt advised.  Will come in Monday for labs and bmet.

## 2012-12-26 NOTE — Telephone Encounter (Signed)
Follow Up     Pt calling in following up on information regarding MAXZIDE and ALDACTONE prescriptions. Would like to speak to nurse.

## 2012-12-27 ENCOUNTER — Emergency Department (HOSPITAL_COMMUNITY)
Admission: EM | Admit: 2012-12-27 | Discharge: 2012-12-27 | Disposition: A | Discharge status: Home / Self Care | Payer: BC Managed Care – PPO | Attending: Emergency Medicine | Admitting: Emergency Medicine

## 2012-12-27 ENCOUNTER — Encounter (HOSPITAL_COMMUNITY): Payer: Self-pay | Admitting: Emergency Medicine

## 2012-12-27 DIAGNOSIS — I10 Essential (primary) hypertension: Secondary | ICD-10-CM

## 2012-12-27 DIAGNOSIS — R51 Headache: Secondary | ICD-10-CM | POA: Insufficient documentation

## 2012-12-27 DIAGNOSIS — Z79899 Other long term (current) drug therapy: Secondary | ICD-10-CM | POA: Insufficient documentation

## 2012-12-27 DIAGNOSIS — Z880 Allergy status to penicillin: Secondary | ICD-10-CM | POA: Insufficient documentation

## 2012-12-27 DIAGNOSIS — R209 Unspecified disturbances of skin sensation: Secondary | ICD-10-CM | POA: Insufficient documentation

## 2012-12-27 DIAGNOSIS — M129 Arthropathy, unspecified: Secondary | ICD-10-CM | POA: Insufficient documentation

## 2012-12-27 DIAGNOSIS — E079 Disorder of thyroid, unspecified: Secondary | ICD-10-CM | POA: Insufficient documentation

## 2012-12-27 LAB — POCT I-STAT, CHEM 8
BUN: 12 mg/dL (ref 6–23)
Calcium, Ion: 1.19 mmol/L (ref 1.13–1.30)
Chloride: 96 mEq/L (ref 96–112)
Potassium: 3.8 mEq/L (ref 3.5–5.1)

## 2012-12-27 NOTE — Progress Notes (Signed)
BMET is normal

## 2012-12-27 NOTE — ED Notes (Signed)
Pt c/o high BP, pt has been dealing with it since last week. Pt checked BP today it was 161/90...193/124 was the last BP she got prior to arrival. Pt took 1/2 of her metroprol XL at 1130. Pt usually takes her medications at night.

## 2012-12-27 NOTE — ED Provider Notes (Signed)
History     CSN: [id number]  Arrival date & time 12/27/12  1233   First MD Initiated Contact with Patient 12/27/12 1501      Chief Complaint  Patient presents with  . Hypertension    (Consider location/radiation/quality/duration/timing/severity/associated sxs/prior treatment) HPI Patient is concerned about elevated blood pressure. States her blood pressures been had for several weeks. Her cardiologist Dr. Tenny Craw has been adjusting her medicines. She took an extra dose of Toprol XL today 11:30 AM, 25 mg. She reports yesterday she states pressure in her head and a numbness in the center before had. She admits to feeling tired for the past 2 days. She denies any chest pain denies shortness of breath denies abdominal pain denies visual changes. No other complaint. No other associated symptoms. Past Medical History  Diagnosis Date  . Hypertension   . Thyroid disease   . Allergy   . Arthritis     Past Surgical History  Procedure Laterality Date  . Total knee arthroplasty    . Breast surgery    . Joint replacement    . Tubal ligation      Family History  Problem Relation Age of Onset  . Hyperlipidemia Mother   . Hypertension Mother   . Heart disease Father   . Diabetes Father   . Hyperlipidemia Sister   . Hypertension Maternal Grandmother   . Diabetes Paternal Grandmother   . Heart disease Paternal Grandmother   . Heart disease Paternal Grandfather     History  Substance Use Topics  . Smoking status: Never Smoker   . Smokeless tobacco: Not on file  . Alcohol Use: No     Comment: wine once in a while    OB History   Grav Para Term Preterm Abortions TAB SAB Ect Mult Living                  Review of Systems  Constitutional: Negative.   Respiratory: Negative.   Cardiovascular: Negative.   Gastrointestinal: Negative.   Musculoskeletal: Negative.   Skin: Negative.   Neurological: Positive for numbness and headaches.       Numbness in center of forehead   Psychiatric/Behavioral: Negative.   All other systems reviewed and are negative.    Allergies  Losartan; Shrimp; Asa; Eggs or egg-derived products; Lisinopril; Penicillins; and Sudafed  Home Medications   Current Outpatient Rx  Name  Route  Sig  Dispense  Refill  . metoprolol succinate (TOPROL-XL) 50 MG 24 hr tablet      Take 1 1/2 tabs (75 mg) in the morning and 50 mg in the afternoon with or immediately following a meal   225 tablet   3   . triamterene-hydrochlorothiazide (MAXZIDE-25) 37.5-25 MG per tablet   Oral   Take 0.5 tablets by mouth daily.         Marland Kitchen amLODipine (NORVASC) 10 MG tablet   Oral   Take 5 mg by mouth daily.         . Azelaic Acid (FINACEA EX)   Apply externally   Apply topically daily.         . cetirizine (ZYRTEC) 10 MG tablet   Oral   Take 10 mg by mouth daily.         . fluticasone (FLONASE) 50 MCG/ACT nasal spray   Nasal   Place 2 sprays into the nose daily.   16 g   3   . levothyroxine (SYNTHROID, LEVOTHROID) 50 MCG tablet   Oral  Take 1 tablet (50 mcg total) by mouth daily.   90 tablet   3   . spironolactone (ALDACTONE) 25 MG tablet   Oral   Take 1 tablet (25 mg total) by mouth daily.   90 tablet   3     BP 166/65  Pulse 70  Temp(Src) 98.1 F (36.7 C) (Oral)  Resp 18  SpO2 98%  Physical Exam  Nursing note and vitals reviewed. Constitutional: She appears well-developed and well-nourished.  HENT:  Head: Normocephalic and atraumatic.  Eyes: Conjunctivae are normal. Pupils are equal, round, and reactive to light.  Fundi benign  Neck: Neck supple. No tracheal deviation present. No thyromegaly present.  Cardiovascular: Normal rate, regular rhythm and normal heart sounds.   No murmur heard. Pulmonary/Chest: Effort normal and breath sounds normal.  Abdominal: Soft. Bowel sounds are normal. She exhibits no distension. There is no tenderness.  Musculoskeletal: Normal range of motion. She exhibits no edema and no  tenderness.  Neurological: She is alert. Coordination normal.  Skin: Skin is warm and dry. No rash noted.  Psychiatric: She has a normal mood and affect.    ED Course  Procedures (including critical care time)  Labs Reviewed - No data to display No results found.   No diagnosis found.   4 PM spoke with [facility] cardiology office Results for orders placed during the hospital encounter of 12/27/12  POCT I-STAT, CHEM 8      Result Value Range   Sodium 135  135 - 145 mEq/L   Potassium 3.8  3.5 - 5.1 mEq/L   Chloride 96  96 - 112 mEq/L   BUN 12  6 - 23 mg/dL   Creatinine, Ser 1.61  0.50 - 1.10 mg/dL   Glucose, Bld 90  70 - 99 mg/dL   Calcium, Ion 0.96  0.45 - 1.30 mmol/L   TCO2 29  0 - 100 mmol/L   Hemoglobin 15.3 (*) 12.0 - 15.0 g/dL   HCT 40.9  81.1 - 91.4 %   No results found.  MDM  Plan we do not need to adjust the patient's medication regimen now her prep her blood pressure has normalized in the ED. She has no evidence of endorgan damage. No sign of hypertensive emergency. She will be seen by a medical provider or physician in 2 days at the office Diagnosis hypertension        Doug Sou, MD 12/27/12 1609

## 2012-12-29 ENCOUNTER — Ambulatory Visit (INDEPENDENT_AMBULATORY_CARE_PROVIDER_SITE_OTHER): Payer: BC Managed Care – PPO | Admitting: *Deleted

## 2012-12-29 ENCOUNTER — Other Ambulatory Visit: Payer: Self-pay | Admitting: *Deleted

## 2012-12-29 ENCOUNTER — Encounter: Payer: BC Managed Care – PPO | Admitting: *Deleted

## 2012-12-29 VITALS — BP 142/68 | HR 59

## 2012-12-29 DIAGNOSIS — E876 Hypokalemia: Secondary | ICD-10-CM

## 2012-12-29 DIAGNOSIS — I10 Essential (primary) hypertension: Secondary | ICD-10-CM

## 2012-12-29 LAB — BASIC METABOLIC PANEL
BUN: 13 mg/dL (ref 6–23)
CO2: 33 mEq/L — ABNORMAL HIGH (ref 19–32)
Calcium: 9.4 mg/dL (ref 8.4–10.5)
GFR: 92.72 mL/min (ref >60.00)
Glucose, Bld: 111 mg/dL — ABNORMAL HIGH (ref 70–99)
Potassium: 3.4 mEq/L — ABNORMAL LOW (ref 3.5–5.1)

## 2012-12-29 MED ORDER — POTASSIUM CHLORIDE CRYS ER 20 MEQ PO TBCR: EXTENDED_RELEASE_TABLET | ORAL | Status: DC

## 2012-12-29 NOTE — Progress Notes (Signed)
Pt came into office today for BP check.  Reports she had a really bad night with an episode.  She states she had heart palps most all day.  Last night when she went to lay down she became "really keyed up" (denies anxiety) she had a headache and a lot of pressure in her head.  She did not take her BP then but at 2:14 am 140/81 HR 59.  At 7:40 am her BP was 131/68 HR 59.  She has a BP diary with BP ranging from 122/79 (which she reports as rare) to 193/124.  She reports her normal is around 145/?.  HR were between 58 and 88.  She had blood work today and is requesting to know when Dr Tenny Craw would like to see her again.  Advised I will forward this information to Dr Tenny Craw and pt will be called back with results and when she is due for f/u.  She is in agreement.

## 2013-01-04 ENCOUNTER — Telehealth: Payer: Self-pay | Admitting: Physician Assistant

## 2013-01-04 NOTE — Telephone Encounter (Signed)
Pt got up with dizziness and had not felt well yesterday. She had extra palpitations last pm and did not sleep well. Her BP was initially OK, but went up and down today. After a nap, her BP was elevated at 172/97, HR 70. She is  Advised patient she could take her Norvasc now, instead of at 6 PM when she normally takes. Advised her that since her heart rate is 70, she could take her evening Toprol-XL 50 mg early as well. Requested she wait an hour between blood pressure medications to make sure her blood pressure would not drop too quickly. Advised her to call 911 and come to the closest emergency room if she has more symptoms.

## 2013-01-05 ENCOUNTER — Encounter (HOSPITAL_COMMUNITY): Payer: Self-pay | Admitting: Emergency Medicine

## 2013-01-05 ENCOUNTER — Telehealth: Payer: Self-pay | Admitting: Internal Medicine

## 2013-01-05 ENCOUNTER — Emergency Department (HOSPITAL_COMMUNITY): Payer: BC Managed Care – PPO

## 2013-01-05 ENCOUNTER — Emergency Department (HOSPITAL_COMMUNITY)
Admission: EM | Admit: 2013-01-05 | Discharge: 2013-01-05 | Disposition: A | Discharge status: Home / Self Care | Payer: BC Managed Care – PPO | Attending: Emergency Medicine | Admitting: Emergency Medicine

## 2013-01-05 DIAGNOSIS — R5383 Other fatigue: Secondary | ICD-10-CM | POA: Insufficient documentation

## 2013-01-05 DIAGNOSIS — R5381 Other malaise: Secondary | ICD-10-CM | POA: Insufficient documentation

## 2013-01-05 DIAGNOSIS — R0602 Shortness of breath: Secondary | ICD-10-CM | POA: Insufficient documentation

## 2013-01-05 DIAGNOSIS — Z880 Allergy status to penicillin: Secondary | ICD-10-CM | POA: Insufficient documentation

## 2013-01-05 DIAGNOSIS — R519 Headache, unspecified: Secondary | ICD-10-CM

## 2013-01-05 DIAGNOSIS — I10 Essential (primary) hypertension: Secondary | ICD-10-CM

## 2013-01-05 DIAGNOSIS — H9319 Tinnitus, unspecified ear: Secondary | ICD-10-CM | POA: Insufficient documentation

## 2013-01-05 DIAGNOSIS — Z79899 Other long term (current) drug therapy: Secondary | ICD-10-CM | POA: Insufficient documentation

## 2013-01-05 DIAGNOSIS — J309 Allergic rhinitis, unspecified: Secondary | ICD-10-CM | POA: Insufficient documentation

## 2013-01-05 DIAGNOSIS — R51 Headache: Secondary | ICD-10-CM | POA: Insufficient documentation

## 2013-01-05 DIAGNOSIS — M129 Arthropathy, unspecified: Secondary | ICD-10-CM | POA: Insufficient documentation

## 2013-01-05 DIAGNOSIS — R42 Dizziness and giddiness: Secondary | ICD-10-CM | POA: Insufficient documentation

## 2013-01-05 DIAGNOSIS — R110 Nausea: Secondary | ICD-10-CM | POA: Insufficient documentation

## 2013-01-05 DIAGNOSIS — Z888 Allergy status to other drugs, medicaments and biological substances status: Secondary | ICD-10-CM | POA: Insufficient documentation

## 2013-01-05 DIAGNOSIS — E079 Disorder of thyroid, unspecified: Secondary | ICD-10-CM | POA: Insufficient documentation

## 2013-01-05 LAB — BASIC METABOLIC PANEL
BUN: 9 mg/dL (ref 6–23)
Calcium: 9.7 mg/dL (ref 8.4–10.5)
GFR calc Af Amer: >90 mL/min (ref >90)
GFR calc non Af Amer: >90 mL/min (ref >90)
Glucose, Bld: 98 mg/dL (ref 70–99)
Potassium: 4 mEq/L (ref 3.5–5.1)

## 2013-01-05 LAB — CBC
HCT: 42.8 % (ref 36.0–46.0)
Hemoglobin: 15.4 g/dL — ABNORMAL HIGH (ref 12.0–15.0)
MCH: 30.2 pg (ref 26.0–34.0)
MCHC: 36 g/dL (ref 30.0–36.0)
RDW: 12.4 % (ref 11.5–15.5)

## 2013-01-05 MED ORDER — LORAZEPAM 1 MG PO TABS
1.0000 mg | ORAL_TABLET | Freq: Once | ORAL | Status: AC
Start: 2013-01-05 — End: 2013-01-05
  Administered 2013-01-05: 1 mg via ORAL
  Filled 2013-01-05: qty 1

## 2013-01-05 NOTE — Telephone Encounter (Signed)
Per Bjorn Loser pa after hours voicemail, pt needs a nurse call to check on her, had some trouble over the weekend

## 2013-01-05 NOTE — Telephone Encounter (Signed)
Called patient to report Dr. Charlott Rakes recommendation to maintain current medications and to bring BP cuff for calibration at Friday appointment. Patient reports that since she spoke with me earlier that she has experienced dizziness, SOB and "tingling" in head.  Patient states sometimes she has bad headaches with elevation of BP.  States BP has gone up to 183/96, pulse 81. Patient took an additional Metoprolol 25 mg @ 0957 today.  I advised patient that with these prolonged symptoms and little response to medication that she should consider going to the hospital as Dr. Tenny Craw is not in the office today and patient had her BP cuff calibrated by [place] 2 weeks ago.  Patient is in agreement since she is not feeling better today.

## 2013-01-05 NOTE — Telephone Encounter (Signed)
Patient to keep log of BP  Bring in cuff to clinic for calibration. Keep on same medicines

## 2013-01-05 NOTE — Telephone Encounter (Signed)
New Prob    Calling to report BP: BP: 183/96 pulse: 81   Please call.

## 2013-01-05 NOTE — Telephone Encounter (Signed)
Notified Lillia Abed, hospital liason that patient is en route to hospital

## 2013-01-05 NOTE — ED Notes (Signed)
Called for patient no answer

## 2013-01-05 NOTE — Telephone Encounter (Signed)
Spoke with patient who reports she is in the hospital parking lot and BP is slightly improved at 130's over 80's.  I advised patient that according to her her BP has been fluctuating since Saturday and patient is still not feeling well.  I advised her that there are no appointments in the office today and that Dr. Tenny Craw will not be in clinic until Friday, which is the day of her appointment.  Patient states that she will go ahead and go into the hospital for evaluation.

## 2013-01-05 NOTE — ED Notes (Signed)
Pt c/o SOB with HA and htn; pt sts taking meds but not helping

## 2013-01-05 NOTE — Telephone Encounter (Signed)
Spoke with patient who experienced elevated BP on Saturday and Sunday.  Patient spoke with Theodore Demark, PA-C yesterday, 6/22 who advised patient to take BP medications a little earlier than normal but with at least 1 hour in between to ensure BP not drop too quickly and that if s/s of dizziness, palpitations, and elevated BP continued, to seek emergency medical attention.  Patient states her BP came down after taking Norvasc but that at about 10:20 pm last night BP went up to 117/119, HR 70.  Patient states she took an extra 1/4 Amlodipine and at 11 pm BP was down to 143/89, HR 64.  Patient states she was able to fall asleep after this.  BP today at 0630 was 115/82, HR 69 and at 0924 144/81, HR 73. Patient states she feels better today after resting - states she did not get much sleep over the weekend.  I advised patient that I would send a message to Dr. Tenny Craw to let her know what has been going on.  Patient has appt Friday 6/27 at 0930. I advised patient that we would call her back if Dr. Tenny Craw wanted to make any changes in patient's treatment.

## 2013-01-05 NOTE — Telephone Encounter (Signed)
Follow Up    Pt has a question for triage nurse. Please call.

## 2013-01-05 NOTE — ED Provider Notes (Signed)
History     CSN: [id number]  Arrival date & time 01/05/13  1142   First MD Initiated Contact with Patient 01/05/13 1247      Chief Complaint  Patient presents with  . Hypertension  . Shortness of Breath  . Headache    (Consider location/radiation/quality/duration/timing/severity/associated sxs/prior treatment) HPI Comments: Patient is a 62 year old female history of hypertension and hypothyroid who presents today as she is concerned that her BP is not well controlled. Patient states she woke this morning and had the sensation of being dizzy with associated nausea. Patient states that the symptoms were followed by a constant, frontal, pressure-like headache without thunderclap onset which has been waxing and waning in severity. Patient states she has had headaches like this in the past which have been attributed to her blood pressure being high. Patient took her blood pressure after onset of symptoms and was found to have a reading of 183/96. Patient admits to associated tinnitus x 1, lasting seconds and spontaneously resolving, fatigue and a sensation of weakness in her upper extremities.  Patient denies recent fevers, vision changes or vision loss, hearing loss, difficulty speaking or swallowing, chest pain, shortness of breath, nausea or vomiting, abdominal pain, and numbness or tingling in her extremities. She called her cardiologist's office who advised that she be seen in the emergency department for further evaluation of her symptoms.   Patient states she has been having difficulty managing her blood pressure for the past year and that her cardiologist has been changing her blood pressure medications frequently. Patient endorses having a stress test and echo done in August 2013, both of which were normal. Dr. Tenny Craw - Cardiologist  Patient is a 62 y.o. female presenting with hypertension, shortness of breath, and headaches.  Hypertension Associated symptoms include fatigue, headaches and  nausea. Pertinent negatives include no abdominal pain, chest pain, fever, numbness or vomiting.  Shortness of Breath Associated symptoms: headaches   Associated symptoms: no abdominal pain, no chest pain, no fever and no vomiting   Headache Associated symptoms: fatigue and nausea   Associated symptoms: no abdominal pain, no fever, no numbness and no vomiting     Past Medical History  Diagnosis Date  . Hypertension   . Thyroid disease   . Allergy   . Arthritis     Past Surgical History  Procedure Laterality Date  . Total knee arthroplasty    . Breast surgery    . Joint replacement    . Tubal ligation      Family History  Problem Relation Age of Onset  . Hyperlipidemia Mother   . Hypertension Mother   . Heart disease Father   . Diabetes Father   . Hyperlipidemia Sister   . Hypertension Maternal Grandmother   . Diabetes Paternal Grandmother   . Heart disease Paternal Grandmother   . Heart disease Paternal Grandfather     History  Substance Use Topics  . Smoking status: Never Smoker   . Smokeless tobacco: Not on file  . Alcohol Use: No     Comment: wine once in a while    OB History   Grav Para Term Preterm Abortions TAB SAB Ect Mult Living                  Review of Systems  Constitutional: Positive for fatigue. Negative for fever.  HENT: Positive for tinnitus (resolved). Negative for trouble swallowing.        +phonophobia  Eyes: Negative for visual disturbance.  Respiratory: Positive  for shortness of breath.   Cardiovascular: Negative for chest pain.  Gastrointestinal: Positive for nausea. Negative for vomiting and abdominal pain.  Neurological: Positive for headaches. Negative for syncope, speech difficulty and numbness.  All other systems reviewed and are negative.    Allergies  Losartan; Shrimp; Asa; Eggs or egg-derived products; Lisinopril; Penicillins; and Sudafed  Home Medications   Current Outpatient Rx  Name  Route  Sig  Dispense  Refill   . amLODipine (NORVASC) 5 MG tablet   Oral   Take 5 mg by mouth daily.         . Azelaic Acid (FINACEA EX)   Topical   Apply 1 application topically daily.          . cetirizine (ZYRTEC) 10 MG tablet   Oral   Take 10 mg by mouth daily.         Marland Kitchen doxycycline (ADOXA) 50 MG tablet   Oral   Take 50 mg by mouth 2 (two) times daily.         . fluticasone (FLONASE) 50 MCG/ACT nasal spray   Nasal   Place 1 spray into the nose daily.         Marland Kitchen levothyroxine (SYNTHROID, LEVOTHROID) 50 MCG tablet   Oral   Take 50 mcg by mouth daily.         . metoprolol succinate (TOPROL-XL) 50 MG 24 hr tablet   Oral   Take 50-75 mg by mouth daily. Take 1 1/2 tabs (75 mg) in the morning and 50 mg in the afternoon with or immediately following a meal         . potassium chloride SA (K-DUR,KLOR-CON) 20 MEQ tablet   Oral   Take 20 mEq by mouth daily. 2 tablets (total 40 mEq) today, then 1 tablet daily         . spironolactone (ALDACTONE) 25 MG tablet   Oral   Take 25 mg by mouth daily.         Marland Kitchen triamterene-hydrochlorothiazide (MAXZIDE-25) 37.5-25 MG per tablet   Oral   Take 0.5 tablets by mouth daily.           BP 102/62  Pulse 58  Temp(Src) 97.9 F (36.6 C) (Oral)  Resp 20  SpO2 91%  Physical Exam  Nursing note and vitals reviewed. Constitutional: She is oriented to person, place, and time. She appears well-developed and well-nourished. No distress.  HENT:  Head: Normocephalic and atraumatic.  Right Ear: Tympanic membrane, external ear and ear canal normal.  Left Ear: Tympanic membrane, external ear and ear canal normal.  Mouth/Throat: Oropharynx is clear and moist. No oropharyngeal exudate.  Symmetric rise of the uvula with phonation  Eyes: Conjunctivae and EOM are normal. Pupils are equal, round, and reactive to light. No scleral icterus.  Neck: Normal range of motion. Neck supple.  Cardiovascular: Normal rate, regular rhythm and normal heart sounds.    Pulmonary/Chest: Effort normal and breath sounds normal. No respiratory distress. She has no wheezes. She has no rales.  Abdominal: Soft. She exhibits no distension. There is no tenderness.  Musculoskeletal: Normal range of motion. She exhibits no edema.  Lymphadenopathy:    She has no cervical adenopathy.  Neurological: She is alert and oriented to person, place, and time. She has normal reflexes. She displays normal reflexes. No cranial nerve deficit. She exhibits normal muscle tone.  Patient speaks in full goal oriented sentences. Cranial nerves III through XII grossly intact. Patient has 5 out of 5  strength against assistance in her upper and lower extremities with equal grip strength bilaterally. DTRs normal and symmetric. No sensory or motor deficits appreciated. Patient moves extremities without ataxia.  Skin: Skin is warm and dry. No rash noted. She is not diaphoretic. No erythema.  Psychiatric: She has a normal mood and affect. Her behavior is normal.    ED Course  Procedures (including critical care time)  Labs Reviewed  CBC - Abnormal; Notable for the following:    Hemoglobin 15.4 (*)    All other components within normal limits  BASIC METABOLIC PANEL - Abnormal; Notable for the following:    Sodium 132 (*)    Chloride 95 (*)    All other components within normal limits  POCT I-STAT TROPONIN I   Dg Chest 2 View  01/05/2013   *RADIOLOGY REPORT*  Clinical Data: Hypertension.  Chest tightness.  Short of breath.  CHEST - 2 VIEW  Comparison: 02/15/2012  Findings: Heart size is normal.  The aorta is unfolded.  Left mid lung granuloma is unchanged.  No sign of active infiltrate, mass, effusion or collapse.  Ordinary degenerative changes effect the spine.  IMPRESSION: No active disease.   Original Report Authenticated By: Paulina Fusi, M.D.    Date: 01/05/2013  Rate: 62  Rhythm: normal sinus rhythm  QRS Axis: normal  Intervals: normal  ST/T Wave abnormalities: normal  Conduction  Disutrbances:none  Narrative Interpretation: NSR without STEMI or ischemic changes  Old EKG Reviewed: unchanged from 10/24/12 I have personally reviewed and interpreted this EKG   1. Hypertension   2. Headache     MDM  Hypertension without complicating features - Patient was seen on 12/27/12 for similar complaints with overall negative work up. Patient's BP well controlled in ED today without medicaion. No focal neurologic deficits appreciated on physical exam and headache improved during ED stay; do not believe further headache w/u with imaging warranted at this time. Work up of CBC and BMP without significant changes; troponin 0.00. Chest Xray without evidence of pneumonia, pneumothorax, pleural effusion, or other acute cardiopulmonary abnormality. EKG also unchanged from 10/24/2012. I do not believe patient's blood pressure medication needs adjusting today given consistently well-controlled blood pressure in ED. Low suspicion for ACS given work up, supported by TIMI score of 0. Patient appropriate for d/c with cardiology follow up for further evaluation of BP. Indications for ED return discussed with patient who verbalizes comfort and understanding with plan.        Antony Madura, [place] 01/08/13 [phone]

## 2013-01-06 NOTE — Telephone Encounter (Signed)
New problem  Pt said she was at the hospital on Monday and had her potassium, check. She wants to know if she still needs to come to her appt tomorrow to have it checked again.

## 2013-01-06 NOTE — Telephone Encounter (Signed)
K+ was 4.0 in ED yesterday.  Advised pt to cancel tomorrow's lab draw but keep appt Friday with Dr. Tenny Craw.  Pt agreed.

## 2013-01-07 ENCOUNTER — Other Ambulatory Visit: Payer: BC Managed Care – PPO

## 2013-01-07 ENCOUNTER — Telehealth: Payer: Self-pay | Admitting: Internal Medicine

## 2013-01-07 ENCOUNTER — Other Ambulatory Visit: Payer: Self-pay | Admitting: *Deleted

## 2013-01-07 MED ORDER — AMLODIPINE BESYLATE 5 MG PO TABS: 5.0000 mg | ORAL_TABLET | Freq: Two times a day (BID) | ORAL | Status: DC

## 2013-01-07 NOTE — Telephone Encounter (Signed)
New Problem Pt is concerned about her BP, she wants to know what she needs to do.  BP for this morning is as followed: 160/88 hr 70 at 5:00 155/92 hr 63 at 7:30 143/83 hr 96 at 8:30 149/100 hr 70 at 11:52

## 2013-01-07 NOTE — Telephone Encounter (Signed)
Will forward to Dr. Ross for recommendations. 

## 2013-01-07 NOTE — Telephone Encounter (Signed)
Go back up to amlodipine 5 bid.  Stay on other meds.

## 2013-01-07 NOTE — Telephone Encounter (Signed)
Pt advised.

## 2013-01-08 ENCOUNTER — Other Ambulatory Visit: Payer: BC Managed Care – PPO

## 2013-01-08 NOTE — ED Provider Notes (Signed)
Medical screening examination/treatment/procedure(s) were performed by non-physician practitioner and as supervising physician I was immediately available for consultation/collaboration.    Vida Roller, MD 01/08/13 [phone]

## 2013-01-09 ENCOUNTER — Telehealth: Payer: Self-pay | Admitting: Internal Medicine

## 2013-01-09 ENCOUNTER — Ambulatory Visit (INDEPENDENT_AMBULATORY_CARE_PROVIDER_SITE_OTHER): Payer: BC Managed Care – PPO | Admitting: Internal Medicine

## 2013-01-09 ENCOUNTER — Encounter: Payer: Self-pay | Admitting: Family Medicine

## 2013-01-09 ENCOUNTER — Other Ambulatory Visit: Payer: Self-pay | Admitting: *Deleted

## 2013-01-09 VITALS — BP 143/80 | HR 65 | Ht 64.5 in | Wt 156.0 lb

## 2013-01-09 DIAGNOSIS — I10 Essential (primary) hypertension: Secondary | ICD-10-CM

## 2013-01-09 DIAGNOSIS — R002 Palpitations: Secondary | ICD-10-CM

## 2013-01-09 MED ORDER — CLONIDINE HCL 0.1 MG PO TABS: 0.1000 mg | ORAL_TABLET | Freq: Every day | ORAL | Status: DC

## 2013-01-09 MED ORDER — CLONIDINE HCL 0.1 MG PO TABS: 0.1000 mg | ORAL_TABLET | Freq: Two times a day (BID) | ORAL | Status: DC

## 2013-01-09 NOTE — Telephone Encounter (Signed)
Follow-up:    Patient called in wanting to know the proper dosing regimin for her cloNIDine (CATAPRES) 0.1 MG tablet.  Please call back.

## 2013-01-09 NOTE — Telephone Encounter (Signed)
New problem    Pt wants to know when she needs to have her potassium checked

## 2013-01-09 NOTE — Progress Notes (Signed)
HPI Patient is a 62 year old with a history of palpitations and HTN I saw her back in August  SInce seen she has had an echo which is normal. She also had an event monitor She has called in many times She was last in clinic in the fall.Patient is a 61 yo with a history of HTN and palpitations. I saw her in Sept. At that time I recomm increasing her toprol xl to 50 bid.  Since seen she has called in with increased BP Meds increased to 10 amlodipine and 100/50 bid  The patient notes increased fatigue. Denies frank dizziness. Just sluggish.  When I saw her in Dec I cut back on toprol due to fatigue  She has called mult times and has been to ER  Meds have been adjusted over the phone She brings in an extensive BP log  Her pressures are very labile.  She will have several days of very good BP readings and then they are high.   She said that she did better when [facility] was added back to her regimen as it took care of her ankle swelling.  Allergies  Allergen Reactions  . Losartan Shortness Of Breath  . Shrimp (Shellfish Allergy) Anaphylaxis  . Asa (Aspirin) Diarrhea    Stabbing pain in intestines  . Eggs Or Egg-Derived Products Other (See Comments)    Reaction unknown  . Lisinopril Cough  . Penicillins Hives  . Sudafed (Pseudoephedrine Hcl) Hypertension    Current Outpatient Prescriptions  Medication Sig Dispense Refill  . amLODipine (NORVASC) 5 MG tablet Take 1 tablet (5 mg total) by mouth 2 (two) times daily.  90 tablet  3  . Azelaic Acid (FINACEA EX) Apply 1 application topically daily.       . cetirizine (ZYRTEC) 10 MG tablet Take 10 mg by mouth daily.      Marland Kitchen doxycycline (ADOXA) 50 MG tablet Take 50 mg by mouth 2 (two) times daily.      . fluticasone (FLONASE) 50 MCG/ACT nasal spray Place 1 spray into the nose daily.      Marland Kitchen levothyroxine (SYNTHROID, LEVOTHROID) 50 MCG tablet Take 50 mcg by mouth daily.      . metoprolol succinate (TOPROL-XL) 50 MG 24 hr tablet Take 50-75 mg by mouth  daily. Take 1 1/2 tabs (75 mg) in the morning and 50 mg in the afternoon with or immediately following a meal      . potassium chloride SA (K-DUR,KLOR-CON) 20 MEQ tablet Take 20 mEq by mouth daily. 2 tablets (total 40 mEq) today, then 1 tablet daily      . spironolactone (ALDACTONE) 25 MG tablet Take 25 mg by mouth daily.      Marland Kitchen triamterene-hydrochlorothiazide (MAXZIDE-25) 37.5-25 MG per tablet Take 0.5 tablets by mouth daily.       No current facility-administered medications for this visit.    Past Medical History  Diagnosis Date  . Hypertension   . Thyroid disease   . Allergy   . Arthritis     Past Surgical History  Procedure Laterality Date  . Total knee arthroplasty    . Breast surgery    . Joint replacement    . Tubal ligation      Family History  Problem Relation Age of Onset  . Hyperlipidemia Mother   . Hypertension Mother   . Heart disease Father   . Diabetes Father   . Hyperlipidemia Sister   . Hypertension Maternal Grandmother   . Diabetes Paternal  Grandmother   . Heart disease Paternal Grandmother   . Heart disease Paternal Grandfather     History   Social History  . Marital Status: Married    Spouse Name: N/A    Number of Children: N/A  . Years of Education: N/A   Occupational History  . Not on file.   Social History Main Topics  . Smoking status: Never Smoker   . Smokeless tobacco: Not on file  . Alcohol Use: No     Comment: wine once in a while  . Drug Use: No  . Sexually Active: Yes   Other Topics Concern  . Not on file   Social History Narrative   Married. Education: [employer]. Exercise: 3 times a week.    Review of Systems:  All systems reviewed.  They are negative to the above problem except as previously stated.  Vital Signs: BP 143/80  P 65  Wt 156  Physical Exam Patient is in NAD HEENT:  Normocephalic, atraumatic. EOMI, PERRLA.  Neck: JVP is normal.  No bruits.  Lungs: clear to auscultation. No rales no wheezes.  Heart:  Regular rate and rhythm. Normal S1, S2. No S3.   No significant murmurs. PMI not displaced.  Abdomen:  Supple, nontender. Normal bowel sounds. No masses. No hepatomegaly.  Extremities:   Good distal pulses throughout. No lower extremity edema.  Musculoskeletal :moving all extremities.  Neuro:   alert and oriented x3.  CN II-XII grossly intact.   Assessment and Plan:  1.  HTN  I am perplexed about how labile her BP is  I would prceed slowly.  She says that she thinks aldactone makes her feel bad.  I would recomm stopping.  She does not think it has done much for her BP I would recomm adding Clonidine 0.1 mg daily at first to her regimen to see if this will stabilize her BP She will return in 6 wks.  2.  Palpitations.  Denies signif symptoms.

## 2013-01-09 NOTE — Telephone Encounter (Signed)
Pt will be seen again in July.  Can check K+ at visit.

## 2013-01-09 NOTE — Patient Instructions (Addendum)
Start Clonidine 0.1mg  daily.  Your physician recommends that you schedule a follow-up appointment in: 4-6 weeks with Dr. Tenny Craw.

## 2013-01-09 NOTE — Telephone Encounter (Signed)
Confirmed dose of Clonidine 0.1mg  daily.

## 2013-01-12 ENCOUNTER — Ambulatory Visit (INDEPENDENT_AMBULATORY_CARE_PROVIDER_SITE_OTHER): Payer: BC Managed Care – PPO | Admitting: Family Medicine

## 2013-01-12 ENCOUNTER — Telehealth: Payer: Self-pay | Admitting: Internal Medicine

## 2013-01-12 VITALS — BP 112/58 | HR 62 | Temp 97.5°F | Resp 18 | Ht 64.0 in | Wt 158.0 lb

## 2013-01-12 DIAGNOSIS — M549 Dorsalgia, unspecified: Secondary | ICD-10-CM

## 2013-01-12 DIAGNOSIS — I10 Essential (primary) hypertension: Secondary | ICD-10-CM

## 2013-01-12 DIAGNOSIS — E876 Hypokalemia: Secondary | ICD-10-CM

## 2013-01-12 LAB — POCT URINALYSIS DIPSTICK
Bilirubin, UA: NEGATIVE
Blood, UA: NEGATIVE
Glucose, UA: NEGATIVE
Leukocytes, UA: NEGATIVE
Nitrite, UA: NEGATIVE
Protein, UA: NEGATIVE
Spec Grav, UA: 1.015
Urobilinogen, UA: 0.2
pH, UA: 6

## 2013-01-12 LAB — COMPREHENSIVE METABOLIC PANEL
ALT: 14 U/L (ref 0–35)
AST: 15 U/L (ref 0–37)
Albumin: 4.3 g/dL (ref 3.5–5.2)
Alkaline Phosphatase: 74 U/L (ref 39–117)
BUN: 13 mg/dL (ref 6–23)
CO2: 29 mEq/L (ref 19–32)
Calcium: 9.6 mg/dL (ref 8.4–10.5)
Chloride: 100 mEq/L (ref 96–112)
Creat: 0.65 mg/dL (ref 0.50–1.10)
Glucose, Bld: 118 mg/dL — ABNORMAL HIGH (ref 70–99)
Potassium: 4 mEq/L (ref 3.5–5.3)
Sodium: 136 mEq/L (ref 135–145)
Total Bilirubin: 0.4 mg/dL (ref 0.3–1.2)
Total Protein: 7.1 g/dL (ref 6.0–8.3)

## 2013-01-12 LAB — POCT UA - MICROSCOPIC ONLY
Bacteria, U Microscopic: NEGATIVE
Casts, Ur, LPF, POC: NEGATIVE
Crystals, Ur, HPF, POC: NEGATIVE
Mucus, UA: POSITIVE
RBC, urine, microscopic: NEGATIVE
WBC, Ur, HPF, POC: NEGATIVE
Yeast, UA: NEGATIVE

## 2013-01-12 NOTE — Addendum Note (Signed)
Addended byAlden Benjamin R on: 01/12/2013 09:33 AM   Modules accepted: Orders

## 2013-01-12 NOTE — Telephone Encounter (Signed)
Called patient   I have reviewed clinic note from today. I told her I was uncomfortable in continuing on with her care.  Too many changes were happening from several care givers. I saw her on Friday  I did not think enough time had passed to assess response to change I had made Patient understands.  Requests return of BP log that I had kept to review.  Will place in mail  As of now, I will not plan on scheduling f/u for patient in clinic

## 2013-01-12 NOTE — Telephone Encounter (Signed)
Patient called to report several things:  1.  Dr. Milus Glazier stated that she should not be on both Clonidine and Metoprolol. --I advised pt that there seems to be too many people working on her blood pressure issues.  She should decide who she is going to let manage it and stick with them.  Patient was just seen by Dr. Tenny Craw on 01/09/2013 and started on Clonidine 0.1mg  daily.  2.  Patient states her blood pressure was up earlier today at 165/100 and approximately 25 minutes later it was 149/86. --I advised patient to stop taking her blood pressure so many time during the day.  Reminded her this only serves to make her anxious and increase her bp even more.  3.  Patient reports an episode of palpitations and tingling in hands. --Will forward to Dr. Tenny Craw with a request for possibly a medicinal intervention for anxiety.  Patient calls 3-4 times per week for the last 2-3 months with concerns over her blood pressure.

## 2013-01-12 NOTE — Progress Notes (Signed)
62 yo woman with hypertension.  She's had problems with controlling the pressure.  Medicines have been changed multiple times, with ED visits for spiking pressures.  When the pressure goes up, she feels frontal headaches.  Her cardiologist has changed amlodipine dosing, added clonidine, switched back from spironolactone to maxzide.    Now she feels sluggish on the clonidine.  She had some recent transient right flank pain.  She had some chest discomfort last night.  BP at the time 156/95.  She didn't sleep well.  Objective:  Alert and articulate HEENT:  Normal Heart:  Regular.  Early SEM, loud S2 Chest:  Clear Ext:  No edema, gait stable Skin:  Warm and dry Neck: supple, no bruit  Assessment:  Hypertension which has been difficult to control

## 2013-01-12 NOTE — Telephone Encounter (Signed)
New Prob     Pt has some questions regarding some medication changes Dr. Milus Glazier suggested. Please call.

## 2013-01-12 NOTE — Patient Instructions (Signed)
Take the clonidine 0.1 mg twice daily Take the amlodipine 5 mg twice a day Take 1/2 maxzide daily Continue the potassium once a day       STOP the metoprolol

## 2013-01-12 NOTE — Telephone Encounter (Signed)
New problem  Pt asked if you could call her on her cell. She said she may go to the hospital because her BP is going up.

## 2013-01-12 NOTE — Telephone Encounter (Signed)
Noted  

## 2013-01-13 ENCOUNTER — Encounter: Payer: Self-pay | Admitting: Family Medicine

## 2013-01-14 ENCOUNTER — Ambulatory Visit (INDEPENDENT_AMBULATORY_CARE_PROVIDER_SITE_OTHER): Payer: BC Managed Care – PPO | Admitting: Family Medicine

## 2013-01-14 VITALS — BP 126/74 | HR 80 | Temp 97.7°F | Resp 16 | Ht 64.0 in | Wt 158.0 lb

## 2013-01-14 DIAGNOSIS — I10 Essential (primary) hypertension: Secondary | ICD-10-CM

## 2013-01-14 NOTE — Progress Notes (Signed)
62 yo woman with hypertension.  She was seen yesterday and the metoprolol was discontinued and the potassium decreased.  Today, she felt her heart racing and measured her pulse at 124.  Objective:  160/64 and pulse 80 Heart:  Reg, no murmur Alert, and in NAD  Assessment:  Adjusting to not having the metoprolol on board.  Plan:  Stick with the current plan  Signed, Elvina Sidle, MD

## 2013-01-15 ENCOUNTER — Encounter: Payer: Self-pay | Admitting: Family Medicine

## 2013-01-15 ENCOUNTER — Encounter: Payer: BC Managed Care – PPO | Admitting: Family Medicine

## 2013-01-18 ENCOUNTER — Encounter: Payer: Self-pay | Admitting: Family Medicine

## 2013-01-19 ENCOUNTER — Telehealth: Payer: Self-pay

## 2013-01-19 ENCOUNTER — Telehealth: Payer: Self-pay | Admitting: Physician Assistant

## 2013-01-19 ENCOUNTER — Encounter: Payer: Self-pay | Admitting: Family Medicine

## 2013-01-19 MED ORDER — AMLODIPINE BESYLATE 5 MG PO TABS: 5.0000 mg | ORAL_TABLET | Freq: Two times a day (BID) | ORAL | Status: DC

## 2013-01-19 NOTE — Addendum Note (Signed)
Addended byCaffie Damme on: 01/19/2013 01:56 PM   Modules accepted: Orders

## 2013-01-19 NOTE — Telephone Encounter (Signed)
Her amlodipine was called in. She was sent my chart messages.

## 2013-01-19 NOTE — Telephone Encounter (Signed)
STEPHEN WOULD LIKE TO SPEAK WITH SOMEONE REGARDING HIS WIFE'S BP. STATES IT IS REALLY HIGH AND I ADVISED HIM TO BRING HER IN, BUT HE STATED DR KURT TOLD THEM TO JUST CALL. PLEASE CALL [phone]

## 2013-01-19 NOTE — Telephone Encounter (Signed)
The pharmacy did not receive the prescription from Dr. Tenny Craw on 01/07/2013 (order type was "no print"). I called in Amlodipine 5 mg, 1 PO BID, #60, RF x 1. Meds ordered this encounter  Medications  . amLODipine (NORVASC) 5 MG tablet    Sig: Take 1 tablet (5 mg total) by mouth 2 (two) times daily.    Dispense:  60 tablet    Refill:  1    Order Specific Question:  Supervising Provider    Answer:  Tonye Pearson [[id number]]   Phone messages and My Chart reviewed 01/07/13-present.

## 2013-01-19 NOTE — Telephone Encounter (Signed)
Sent again, she said [facility] did not get this. 177/91 was her BP reading, now it down to 151/94 to you FYI

## 2013-01-24 ENCOUNTER — Encounter: Payer: Self-pay | Admitting: Family Medicine

## 2013-01-24 ENCOUNTER — Ambulatory Visit (INDEPENDENT_AMBULATORY_CARE_PROVIDER_SITE_OTHER): Payer: BC Managed Care – PPO | Admitting: Family Medicine

## 2013-01-24 VITALS — BP 150/76 | HR 67 | Temp 97.8°F | Resp 16 | Ht 64.25 in | Wt 157.0 lb

## 2013-01-24 DIAGNOSIS — R682 Dry mouth, unspecified: Secondary | ICD-10-CM

## 2013-01-24 DIAGNOSIS — L299 Pruritus, unspecified: Secondary | ICD-10-CM

## 2013-01-24 DIAGNOSIS — K117 Disturbances of salivary secretion: Secondary | ICD-10-CM

## 2013-01-24 DIAGNOSIS — G4700 Insomnia, unspecified: Secondary | ICD-10-CM

## 2013-01-24 DIAGNOSIS — I10 Essential (primary) hypertension: Secondary | ICD-10-CM

## 2013-01-24 MED ORDER — LORAZEPAM 0.5 MG PO TABS: 0.5000 mg | ORAL_TABLET | Freq: Every evening | ORAL | Status: DC | PRN

## 2013-01-24 NOTE — Progress Notes (Signed)
Patient ID: Karcyn Menn MRN: [id number], DOB: Jan 11, 1951, 62 y.o. Date of Encounter: 01/24/2013, 3:13 PM  Primary Physician: Elvina Sidle, MD  Chief Complaint: HTN  HPI: 62 y.o. year old female with history below presents for hypertension follow up. Patient checks blood pressure 5 times a day.  Occasionally patient feels facial fullness associated with BP of 170 systolic, but for the most part the blood pressures are very acceptable..  She did not sleep at all last night.  Complains of body itching and malaise  Diet consists of low salt diet No CP, HA, visual changes, or focal deficits.   Past Medical History  Diagnosis Date  . Hypertension   . Thyroid disease   . Allergy   . Arthritis      Home Meds: Prior to Admission medications   Medication Sig Start Date End Date Taking? Authorizing Provider  amLODipine (NORVASC) 5 MG tablet Take 1 tablet (5 mg total) by mouth 2 (two) times daily. 01/19/13  Yes Chelle S Jeffery, PA-C  Azelaic Acid (FINACEA EX) Apply 1 application topically daily.    Yes Historical Provider, MD  cloNIDine (CATAPRES) 0.1 MG tablet Take 1 tablet (0.1 mg total) by mouth daily. 01/09/13  Yes Pricilla Riffle, MD  fluticasone [facility]) 50 MCG/ACT nasal spray Place 1 spray into the nose daily. 11/19/12  Yes Elvina Sidle, MD  levothyroxine (SYNTHROID, LEVOTHROID) 50 MCG tablet Take 50 mcg by mouth daily. 11/19/12  Yes Elvina Sidle, MD  potassium chloride SA (K-DUR,KLOR-CON) 20 MEQ tablet Take 20 mEq by mouth daily. 2 tablets (total 40 mEq) today, then 1 tablet daily 12/29/12  Yes Pricilla Riffle, MD  triamterene-hydrochlorothiazide (MAXZIDE-25) 37.5-25 MG per tablet Take 0.5 tablets by mouth daily.   Yes Historical Provider, MD    Allergies:  Allergies  Allergen Reactions  . Losartan Shortness Of Breath  . Shrimp (Shellfish Allergy) Anaphylaxis  . Asa (Aspirin) Diarrhea    Stabbing pain in intestines  . Eggs Or Egg-Derived Products Other (See Comments)   Reaction unknown  . Lisinopril Cough  . Penicillins Hives  . Sudafed (Pseudoephedrine Hcl) Hypertension    History   Social History  . Marital Status: Married    Spouse Name: N/A    Number of Children: N/A  . Years of Education: N/A   Occupational History  . Not on file.   Social History Main Topics  . Smoking status: Never Smoker   . Smokeless tobacco: Not on file  . Alcohol Use: No     Comment: wine once in a while  . Drug Use: No  . Sexually Active: Yes   Other Topics Concern  . Not on file   Social History Narrative   Married. Education: [employer]. Exercise: 3 times a week.     Family History  Problem Relation Age of Onset  . Hyperlipidemia Mother   . Hypertension Mother   . Heart disease Father   . Diabetes Father   . Hyperlipidemia Sister   . Hypertension Maternal Grandmother   . Diabetes Paternal Grandmother   . Heart disease Paternal Grandmother   . Heart disease Paternal Grandfather     Review of Systems: Constitutional: negative for chills, fever, night sweats, weight changes, or fatigue  HEENT: negative for vision changes, hearing loss, congestion, rhinorrhea, ST, epistaxis, or sinus pressure Cardiovascular: negative for chest pain, palpitations, or DOE Respiratory: negative for hemoptysis, wheezing, shortness of breath, or cough Abdominal: negative for abdominal pain, nausea, vomiting, diarrhea, or constipation Dermatological: negative for  rash Neurologic: negative for headache, dizziness, or syncope All other systems reviewed and are otherwise negative with the exception to those above and in the HPI.   Physical Exam: Blood pressure 150/76, pulse 67, temperature 97.8 F (36.6 C), temperature source Oral, resp. rate 16, height 5' 4.25" (1.632 m), weight 157 lb (71.215 kg), SpO2 98.00%., Body mass index is 26.74 kg/(m^2). General: Well developed, well nourished, in no acute distress. Head: Normocephalic, atraumatic, eyes without discharge, sclera  non-icteric, nares are without discharge. Bilateral auditory canals clear, TM's are without perforation, pearly grey and translucent with reflective cone of light bilaterally. Oral cavity moist, posterior pharynx without exudate, erythema, peritonsillar abscess, or post nasal drip.  Neck: Supple. No thyromegaly. Full ROM. No lymphadenopathy. No carotid bruits. Lungs: Clear bilaterally to auscultation without wheezes, rales, or rhonchi. Breathing is unlabored. Heart: RRR with S1 S2. No murmurs, rubs, or gallops appreciated.  Abdomen: Soft, non-tender, non-distended with normoactive bowel sounds. No hepatosplenomegaly. No rebound/guarding. No obvious abdominal masses. Msk:  Strength and tone normal for age. Extremities/Skin: Warm and dry. No clubbing or cyanosis. No edema. No rashes or suspicious lesions. Distal pulses 2+ and equal bilaterally. Neuro: Alert and oriented X 3. Moves all extremities spontaneously. Gait is normal. CNII-XII grossly in tact. DTR 2+, cerebellar function intact. Rhomberg normal. Psych:  Responds to questions appropriately with a normal affect.   I listened patient perseverate about her blood pressure for 30 mins and urged her to not check her blood pressure so often and relaxed. We also talked about her insomnia for which I gave her Ativan 1/2 mg when necessary at bedtime ASSESSMENT AND PLAN:  62 y.o. year old female with Hypertension  Dry mouth  Pruritic dermatitis  Insomnia - Plan: LORazepam (ATIVAN) 0.5 MG tablet   -  Signed, Elvina Sidle, MD 01/24/2013 3:13 PM

## 2013-01-25 ENCOUNTER — Encounter: Payer: Self-pay | Admitting: Family Medicine

## 2013-01-26 ENCOUNTER — Other Ambulatory Visit: Payer: Self-pay

## 2013-01-26 DIAGNOSIS — Z1231 Encounter for screening mammogram for malignant neoplasm of breast: Secondary | ICD-10-CM

## 2013-02-09 ENCOUNTER — Ambulatory Visit: Payer: BC Managed Care – PPO | Admitting: Internal Medicine

## 2013-02-11 ENCOUNTER — Encounter: Payer: Self-pay | Admitting: Family Medicine

## 2013-02-13 ENCOUNTER — Ambulatory Visit (INDEPENDENT_AMBULATORY_CARE_PROVIDER_SITE_OTHER): Payer: BC Managed Care – PPO | Admitting: Family Medicine

## 2013-02-13 ENCOUNTER — Encounter: Payer: Self-pay | Admitting: Family Medicine

## 2013-02-13 VITALS — BP 138/75 | HR 78 | Temp 97.5°F | Resp 18 | Ht 64.5 in | Wt 161.0 lb

## 2013-02-13 DIAGNOSIS — R300 Dysuria: Secondary | ICD-10-CM

## 2013-02-13 DIAGNOSIS — R002 Palpitations: Secondary | ICD-10-CM

## 2013-02-13 LAB — POCT CBC
Granulocyte percent: 72.4 %G (ref 37–80)
MCH, POC: 30.4 pg (ref 27–31.2)
MCV: 91.4 fL (ref 80–97)
MID (cbc): 0.5 (ref 0–0.9)
MPV: 8.3 fL (ref 0–99.8)
POC LYMPH PERCENT: 22.5 %L (ref 10–50)
POC MID %: 5.1 %M (ref 0–12)
Platelet Count, POC: 289 10*3/uL (ref 142–424)
RBC: 5.14 M/uL (ref 4.04–5.48)
RDW, POC: 13.5 %

## 2013-02-13 NOTE — Progress Notes (Signed)
[facility] [address], [place] [address] [phone]  Date:  02/13/2013   Name:  Angelica Levine   DOB:  03-13-1951   MRN:  [id number]  PCP:  Elvina Sidle, MD    Chief Complaint: Palpitations and Dizziness   History of Present Illness:  Angelica Levine is a 62 y.o. very pleasant female patient who presents with the following:  She decreased her potassium dosage last month on the advice of her doctor.   Today she felt "just a little dizzy, my heart rate went up, my forehead was going numb."  These sx persisted for about 30 minutes.  She took a lorazepam and now feels ok. She did not have any CP, but did feel that she had some SOB during the acute episode, SOB now resolved.   She noted that her BP was 194/95, pulse 121 when she was at home.. She has noted some episodes like this fairly often over the last year.  She started clonidine about 5 weeks ago, and this has helped a lot.  Prior to starting clonidine she would have these sx every few days.   She brings with her a notebook with many BP readings- she checks her BP and pulse several times a day.   She does take lorazepam at times for anxiety  Patient Active Problem List   Diagnosis Date Noted  . Rosacea 02/28/2012  . HTN (hypertension) 01/21/2012  . Hypothyroidism 11/19/2011  . Allergic rhinitis 11/19/2011  . DJD (degenerative joint disease) 11/19/2011  . Lipids abnormal 11/19/2011    Past Medical History  Diagnosis Date  . Hypertension   . Thyroid disease   . Allergy   . Arthritis     Past Surgical History  Procedure Laterality Date  . Total knee arthroplasty    . Breast surgery    . Joint replacement    . Tubal ligation      History  Substance Use Topics  . Smoking status: Never Smoker   . Smokeless tobacco: Not on file  . Alcohol Use: No     Comment: wine once in a while    Family History  Problem Relation Age of Onset  . Hyperlipidemia Mother   . Hypertension Mother   .  Heart disease Father   . Diabetes Father   . Hyperlipidemia Sister   . Hypertension Maternal Grandmother   . Diabetes Paternal Grandmother   . Heart disease Paternal Grandmother   . Heart disease Paternal Grandfather     Allergies  Allergen Reactions  . Losartan Shortness Of Breath  . Shrimp (Shellfish Allergy) Anaphylaxis  . Asa (Aspirin) Diarrhea    Stabbing pain in intestines  . Eggs Or Egg-Derived Products Other (See Comments)    Reaction unknown  . Lisinopril Cough  . Penicillins Hives  . Sudafed (Pseudoephedrine Hcl) Hypertension    Medication list has been reviewed and updated.  Current Outpatient Prescriptions on File Prior to Visit  Medication Sig Dispense Refill  . amLODipine (NORVASC) 5 MG tablet Take 1 tablet (5 mg total) by mouth 2 (two) times daily.  60 tablet  1  . Azelaic Acid (FINACEA EX) Apply 1 application topically daily.       . cloNIDine (CATAPRES) 0.1 MG tablet Take 1 tablet (0.1 mg total) by mouth daily.  90 tablet  3  . fluticasone (FLONASE) 50 MCG/ACT nasal spray Place 1 spray into the nose daily.      Marland Kitchen levothyroxine (SYNTHROID, LEVOTHROID) 50 MCG  tablet Take 50 mcg by mouth daily.      Marland Kitchen LORazepam (ATIVAN) 0.5 MG tablet Take 1 tablet (0.5 mg total) by mouth at bedtime as needed for anxiety.  30 tablet  3  . potassium chloride SA (K-DUR,KLOR-CON) 20 MEQ tablet Take 20 mEq by mouth daily. 2 tablets (total 40 mEq) today, then 1 tablet daily      . triamterene-hydrochlorothiazide (MAXZIDE-25) 37.5-25 MG per tablet Take 0.5 tablets by mouth daily.      Marland Kitchen azelastine (ASTELIN) 137 MCG/SPRAY nasal spray Place 1 spray into the nose 2 (two) times daily. Use in each nostril as directed       No current facility-administered medications on file prior to visit.    Review of Systems:  As per HPI- otherwise negative.   Physical Examination: Filed Vitals:   02/13/13 1438  BP: 144/72  Pulse: 78  Temp: 97.5 F (36.4 C)  Resp: 18   Filed Vitals:    02/13/13 1438  Height: 5' 4.5" (1.638 m)  Weight: 161 lb (73.029 kg)   Body mass index is 27.22 kg/(m^2). Ideal Body Weight: Weight in (lb) to have BMI = 25: 147.6  GEN: WDWN, NAD, Non-toxic, A & O x 3, here with her husband today HEENT: Atraumatic, Normocephalic. Neck supple. No masses, No LAD.  Bilateral TM wnl, oropharynx normal.  PEERL,EOMI.   Ears and Nose: No external deformity. CV: RRR, No M/G/R. No JVD. No thrill. No extra heart sounds. PULM: CTA B, no wheezes, crackles, rhonchi. No retractions. No resp. distress. No accessory muscle use. ABD: S, NT, ND, +BS. No rebound. No HSM. EXTR: No c/c/e NEURO Normal gait. Normal strength, sensation, and DTR all extremities,  Normal romberg, normal facial motion PSYCH: Normally interactive. Conversant.   Results for orders placed in visit on 02/13/13  POCT CBC      Result Value Range   WBC 9.0  4.6 - 10.2 K/uL   Lymph, poc 2.0  0.6 - 3.4   POC LYMPH PERCENT 22.5  10 - 50 %L   MID (cbc) 0.5  0 - 0.9   POC MID % 5.1  0 - 12 %M   POC Granulocyte 6.5  2 - 6.9   Granulocyte percent 72.4  37 - 80 %G   RBC 5.14  4.04 - 5.48 M/uL   Hemoglobin 15.6  12.2 - 16.2 g/dL   HCT, POC 82.9  56.2 - 47.9 %   MCV 91.4  80 - 97 fL   MCH, POC 30.4  27 - 31.2 pg   MCHC 33.2  31.8 - 35.4 g/dL   RDW, POC 13.0     Platelet Count, POC 289  142 - 424 K/uL   MPV 8.3  0 - 99.8 fL  GLUCOSE, POCT (MANUAL RESULT ENTRY)      Result Value Range   POC Glucose 82  70 - 99 mg/dl   EKG: NSR, no ST elevation or depression, no change from previous.   Assessment and Plan: Palpitations - Plan: POCT CBC, POCT glucose (manual entry), Basic metabolic panel, EKG 12-Lead  Recurrent palpitations and labile BP.   Await electrlytes and follow- up with result.   BP and pulse now look good and her sx are resolved. Offered to arrange further cardiac evaluation at the hospital, but she declines at this time. If any CP or other concerning sx she will go to the ER    Signed Abbe Amsterdam, MD

## 2013-02-13 NOTE — Patient Instructions (Addendum)
Let us know if you have any other problems.  I will be in touch with your potassium level when it comes back from the lab.

## 2013-02-14 ENCOUNTER — Encounter: Payer: Self-pay | Admitting: Family Medicine

## 2013-02-14 ENCOUNTER — Other Ambulatory Visit: Payer: Self-pay | Admitting: Family Medicine

## 2013-02-14 DIAGNOSIS — E876 Hypokalemia: Secondary | ICD-10-CM

## 2013-02-14 LAB — BASIC METABOLIC PANEL
BUN: 10 mg/dL (ref 6–23)
Calcium: 10.4 mg/dL (ref 8.4–10.5)
Glucose, Bld: 94 mg/dL (ref 70–99)
Sodium: 139 mEq/L (ref 135–145)

## 2013-02-14 MED ORDER — POTASSIUM CHLORIDE CRYS ER 20 MEQ PO TBCR: 20.0000 meq | EXTENDED_RELEASE_TABLET | Freq: Every day | ORAL | Status: DC

## 2013-02-15 ENCOUNTER — Encounter: Payer: Self-pay | Admitting: Family Medicine

## 2013-02-18 ENCOUNTER — Telehealth: Payer: Self-pay

## 2013-02-18 ENCOUNTER — Ambulatory Visit (INDEPENDENT_AMBULATORY_CARE_PROVIDER_SITE_OTHER): Payer: BC Managed Care – PPO | Admitting: Family Medicine

## 2013-02-18 ENCOUNTER — Encounter: Payer: Self-pay | Admitting: Family Medicine

## 2013-02-18 VITALS — BP 140/80 | HR 84 | Temp 98.1°F | Resp 16 | Ht 64.5 in | Wt 162.0 lb

## 2013-02-18 DIAGNOSIS — R7989 Other specified abnormal findings of blood chemistry: Secondary | ICD-10-CM

## 2013-02-18 DIAGNOSIS — E039 Hypothyroidism, unspecified: Secondary | ICD-10-CM

## 2013-02-18 DIAGNOSIS — I10 Essential (primary) hypertension: Secondary | ICD-10-CM

## 2013-02-18 DIAGNOSIS — R946 Abnormal results of thyroid function studies: Secondary | ICD-10-CM

## 2013-02-18 NOTE — Progress Notes (Signed)
62 yo woman concerned about her pulse and blood pressure.  Also wants clarification of potassium dosage.  She checks her pulse and BP multiple times each day. Today the blood pressure was as high as 156 systolic and 102 diastolic.  Pulse was 104 this afternoon.  Objective:  NAD, seen with husband. Chest:  Clear Heart:  Regular, no murmur Extremities:  No edema  Assessment:  Chronic fluctuating symptoms interfering with activity.  I wonder if this could be an endocrine syndrome  Plan:  Endocrine consult One potassium per week Otherwise, no change.  Signed, Elvina Sidle

## 2013-02-19 ENCOUNTER — Ambulatory Visit: Payer: BC Managed Care – PPO | Admitting: Family Medicine

## 2013-02-19 LAB — T3, FREE: T3, Free: 3.2 pg/mL (ref 2.3–4.2)

## 2013-02-19 LAB — T4, FREE: Free T4: 1.42 ng/dL (ref 0.80–1.80)

## 2013-02-19 LAB — TSH: TSH: 2.59 u[IU]/mL (ref 0.350–4.500)

## 2013-02-20 ENCOUNTER — Encounter: Payer: Self-pay | Admitting: Family Medicine

## 2013-02-20 ENCOUNTER — Telehealth: Payer: Self-pay

## 2013-02-20 ENCOUNTER — Other Ambulatory Visit: Payer: Self-pay | Admitting: Family Medicine

## 2013-02-20 NOTE — Telephone Encounter (Signed)
Ok to wait until Sept 9

## 2013-02-20 NOTE — Telephone Encounter (Signed)
Message copied by Johnnette Litter on Fri Feb 20, 2013  9:53 AM ------      Message from: Elvina Sidle      Created: Fri Feb 20, 2013  8:00 AM       Please inform patient of normal result.  Thyroid normal.  Endocrine consult pending, hopefully will shed some light on the fluctuations of blood pressure and symptoms. ------

## 2013-02-20 NOTE — Telephone Encounter (Signed)
Pt notified of labs. Says she did get a call for Endo, but the appt isn't until 9/9. Is this Ok or should she see someone else sooner?

## 2013-02-21 NOTE — Telephone Encounter (Signed)
Lm for pt- appt date is ok.

## 2013-02-22 ENCOUNTER — Other Ambulatory Visit: Payer: Self-pay | Admitting: Family Medicine

## 2013-02-22 DIAGNOSIS — E039 Hypothyroidism, unspecified: Secondary | ICD-10-CM

## 2013-02-23 MED ORDER — LEVOTHYROXINE SODIUM 50 MCG PO TABS: 50.0000 ug | ORAL_TABLET | Freq: Every day | ORAL | Status: DC

## 2013-02-27 ENCOUNTER — Ambulatory Visit: Payer: BC Managed Care – PPO | Admitting: Endocrinology

## 2013-03-16 ENCOUNTER — Encounter: Payer: Self-pay | Admitting: Family Medicine

## 2013-03-17 ENCOUNTER — Encounter: Payer: Self-pay | Admitting: Family Medicine

## 2013-03-18 ENCOUNTER — Other Ambulatory Visit: Payer: Self-pay | Admitting: Physician Assistant

## 2013-03-18 MED ORDER — AMLODIPINE BESYLATE 5 MG PO TABS: 5.0000 mg | ORAL_TABLET | Freq: Two times a day (BID) | ORAL | Status: DC

## 2013-03-18 NOTE — Telephone Encounter (Signed)
Error

## 2013-03-19 ENCOUNTER — Encounter: Payer: Self-pay | Admitting: Family Medicine

## 2013-03-19 MED ORDER — AMLODIPINE BESYLATE 5 MG PO TABS: 5.0000 mg | ORAL_TABLET | Freq: Two times a day (BID) | ORAL | Status: DC

## 2013-03-24 ENCOUNTER — Ambulatory Visit (INDEPENDENT_AMBULATORY_CARE_PROVIDER_SITE_OTHER): Payer: BC Managed Care – PPO | Admitting: Internal Medicine

## 2013-03-24 ENCOUNTER — Encounter: Payer: Self-pay | Admitting: Internal Medicine

## 2013-03-24 VITALS — BP 138/64 | HR 87 | Temp 98.2°F | Resp 12 | Ht 64.5 in | Wt 163.0 lb

## 2013-03-24 DIAGNOSIS — I10 Essential (primary) hypertension: Secondary | ICD-10-CM

## 2013-03-24 DIAGNOSIS — E039 Hypothyroidism, unspecified: Secondary | ICD-10-CM

## 2013-03-24 LAB — BASIC METABOLIC PANEL
CO2: 29 mEq/L (ref 19–32)
Calcium: 9.4 mg/dL (ref 8.4–10.5)
GFR: 107.05 mL/min (ref >60.00)
Glucose, Bld: 98 mg/dL (ref 70–99)
Potassium: 3.6 mEq/L (ref 3.5–5.1)
Sodium: 138 mEq/L (ref 135–145)

## 2013-03-24 NOTE — Progress Notes (Addendum)
Subjective:     Patient ID: Angelica Levine, female   DOB: 11/06/1950, 62 y.o.   MRN: [id number]  HPI [name] is a pleasant 62 y/o woman referred by her PCP, Dr Faustino Congress for evaluation for hypothyroidism and HTN. Pt is here with her husband that offers part of the history.  She started to have spells of increased BP (max 200/104) and tachycardia last year. The spells can come after 1 week intervals or several in a week.   She has the following sxs during a spell: - no HAs - very mild, not usually with the spells (felt blood rushing to head when increased to 10 mg Norvasc) - no blurry vision - no CP - no SOB - + palpitations - no pallor, + flushing - no sweating - sometimes nausea, no vomiting, but has a BM right after the spell (sometimes loose stool) - + tremor - no anxiety during the spells   She checks BP and pulse at home and documents it. No pattern. Reviewed log: 118-144/68-89, pulse 66-89, but felt poorly and had to lay down most of the day.   She is on the following regimen: - Clonidine 0.1 mg bid - Amlodipine 10 mg daily - Triamterene -HCTZ 37.5-25 mg daily - Potassium supplements - 20 mEq once a week - started after having a Norovirus inf with diarrhea in 10/2012.  She was on Metoprolol last year up until 12/2012 by cardiology but she had higher BP then and higher heart rate. Clonidine works better.  Reviewed pt's latest BPs: Vitals - 1 value per visit 03/24/2013 02/18/2013 02/13/2013 01/24/2013 01/14/2013  SYSTOLIC 138 140 469 150 126  DIASTOLIC 64 80 75 76 74  Pulse 87 84 78 67 80   Vitals - 1 value per visit 01/12/2013 01/09/2013 01/05/2013  SYSTOLIC 112 143 629  DIASTOLIC 58 80 62  Pulse 62 65 58   Reviewed pt's latest BMP:   Chemistry      Component Value Date/Time   NA 139 02/13/2013 1543   K 3.9 02/13/2013 1543   CL 98 02/13/2013 1543   CO2 29 02/13/2013 1543   BUN 10 02/13/2013 1543   CREATININE 0.56 02/13/2013 1543   CREATININE 0.57 01/05/2013 1215      Component Value  Date/Time   CALCIUM 10.4 02/13/2013 1543   ALKPHOS 74 01/12/2013 0925   AST 15 01/12/2013 0925   ALT 14 01/12/2013 0925   BILITOT 0.4 01/12/2013 0925  She was on 10 mEq daily K then.    Reviewed pt's 2dEcho (03/05/2012)-checked for palpitations: EF 55-60%, placed on event monitor then (2013) >> no significant pathology  Pt also has mild hypothyroidism, and takes 50 mcg Synthroid daily. She takes it correctly. Reviewed latest TFTs - normal: Lab Results  Component Value Date   TSH 2.590 02/18/2013   TSH 1.999 11/17/2012   TSH 1.791 10/11/2012   TSH 2.251 06/25/2012   TSH 1.731 01/21/2012   TSH 1.740 11/19/2011   FREET4 1.42 02/18/2013   FREET4 1.35 10/11/2012   No injection of steroids - only one in 2003.  She eats a 2000 mg diet daily. No smoking, no alcohol, no coffee. She [place] has a H/o HL.   She had a dilated eye exam in 06/2012 (Dr. Nelle Don - [facility]). This was normal.  She has FH of HTN in mother and MGM.  Review of Systems - neg., except per HPI Constitutional: no weight gain/loss, no fatigue, no subjective hyperthermia/hypothermia Eyes: no blurry vision, no  xerophthalmia ENT: no sore throat, no nodules palpated in throat, no dysphagia/odynophagia, no hoarseness Cardiovascular: no CP/SOB//leg swelling Respiratory: no cough/SOB Gastrointestinal: no N/V/D/C Musculoskeletal: no muscle/joint aches Skin: no rashes Neurological: no tremors/numbness/tingling/dizziness Psychiatric: no depression/anxiety  Past Medical History  Diagnosis Date  . Hypertension   . Thyroid disease   . Allergy   . Arthritis    Past Surgical History  Procedure Laterality Date  . Total knee arthroplasty L 2009  . Breast surgery    . Joint replacement L R 2005 2003  . Tubal ligation     History   Social History  . Marital Status: Married    Spouse Name: N/A    Number of Children: 2   Occupational History  . retired   Social History Main Topics  . Smoking status: Never Smoker   .  Smokeless tobacco: Never Used  . Alcohol Use: No     Comment: wine once in a while  . Drug Use: No  . Sexual Activity: Yes    Partners: Male   Social History Narrative   Married. Education: [employer].    Exercise: 2 times a week; walk and gardening   Caffeine use: none      Current Outpatient Prescriptions on File Prior to Visit  Medication Sig Dispense Refill  . amLODipine (NORVASC) 5 MG tablet Take 1 tablet (5 mg total) by mouth 2 (two) times daily.  60 tablet  1  . Azelaic Acid (FINACEA EX) Apply 1 application topically daily.       . cetirizine (ZYRTEC) 10 MG tablet Take 10 mg by mouth daily.      . cloNIDine (CATAPRES) 0.1 MG tablet Take 1 tablet (0.1 mg total) by mouth daily.  90 tablet  3  . fluticasone (FLONASE) 50 MCG/ACT nasal spray Place 1 spray into the nose daily.      Marland Kitchen levothyroxine (SYNTHROID, LEVOTHROID) 50 MCG tablet Take 1 tablet (50 mcg total) by mouth daily.  90 tablet  1  . LORazepam (ATIVAN) 0.5 MG tablet Take 1 tablet (0.5 mg total) by mouth at bedtime as needed for anxiety.  30 tablet  3  . potassium chloride SA (K-DUR,KLOR-CON) 20 MEQ tablet Take 1 tablet (20 mEq total) by mouth daily. 2 tablets (total 40 mEq) today, then 1 tablet daily  60 tablet  1  . triamterene-hydrochlorothiazide (MAXZIDE-25) 37.5-25 MG per tablet Take 0.5 tablets by mouth daily.      Marland Kitchen azelastine (ASTELIN) 137 MCG/SPRAY nasal spray Place 1 spray into the nose 2 (two) times daily. Use in each nostril as directed       No current facility-administered medications on file prior to visit.   Allergies  Allergen Reactions  . Losartan Shortness Of Breath  . Shrimp [Shellfish Allergy] Anaphylaxis  . Asa [Aspirin] Diarrhea    Stabbing pain in intestines  . Eggs Or Egg-Derived Products Other (See Comments)    Reaction unknown  . Lisinopril Cough  . Penicillins Hives  . Sudafed [Pseudoephedrine Hcl] Hypertension   Family History  Problem Relation Age of Onset  . Hyperlipidemia Mother   .  Hypertension Mother   . Heart disease Father   . Diabetes Father   . Hyperlipidemia Sister   . Hypertension Maternal Grandmother   . Diabetes Paternal Grandmother   . Heart disease Paternal Grandmother   . Heart disease Paternal Grandfather    Objective:   Physical Exam BP 138/64  Pulse 87  Temp(Src) 98.2 F (36.8 C) (Oral)  Resp  12  Ht 5' 4.5" (1.638 m)  Wt 163 lb (73.936 kg)  BMI 27.56 kg/m2  SpO2 96% Wt Readings from Last 3 Encounters:  03/24/13 163 lb (73.936 kg)  02/18/13 162 lb (73.483 kg)  02/13/13 161 lb (73.029 kg)   Constitutional: overweight, in NAD Eyes: PERRLA, EOMI, no exophthalmos ENT: moist mucous membranes, no thyromegaly, no cervical lymphadenopathy Cardiovascular: RRR, No MRG Respiratory: CTA B Gastrointestinal: abdomen soft, NT, ND, BS+ Musculoskeletal: no deformities, strength intact in all 4 Skin: moist, warm, no rashes Neurological: no tremor with outstretched hands, DTR normal in all 4    Assessment:     1. HTN - spells of high BP + tachycardia + palpitations; no HA; + flushing (not pallor)  2. Hypothyroidism - well ctrl'ed - on levothyroxine 50    Plan:     1. Hypothyroidism - This is very mild, on replacement with a low dose of levothyroxine, which he takes correctly. Her thyroid tests are normal, therefore, I do not feel that her blood pressure issues are related to her well-controlled hypothyroidism. - Continue current levothyroxine replacement dose  2. Hypertension - the patient does not have very high blood pressures on a regular basis, however she has occasional "spells" with high blood pressures up to the  180s/100 (lately), and also has palpitations and tachycardia at that time. Of note she did have an event monitor last year, which did not show abnormalities. A 2-D echo had normal ejection fraction as per records from 10/2012.  I discussed with patient and her husband about possible endocrine causes for hypertension, to  include: - Hypothyroidism (not her case, see above) - Cushing disease (I have very low suspicion for this, she does not have any other signs or symptoms of it; no exogenous steroid use) - Pheochromocytoma (will test for this) - Primary hyperaldosteronism (will test for this)  For the pheochromocytoma check, she would have been ideally off clonidine, I would check a plasma catecholamine and metanephrines today, and we'll substitute clonidine with doxazosin or other methyldopa only if these are high.   She also has a history of low potassium, which can appear in the setting of primary hyperaldosteronism, but I would have expected a more severe hypokalemia if the hyperaldosteronism was as severe as to cause this degree of hypertension plus spells.   Orders Placed This Encounter  Procedures  . Basic Metabolic Panel (BMET)  . Magnesium  . Aldosterone + renin activity w/ ratio  . Catecholamines, Fractionated, Plasma  . Metanephrines, plasma   Component     Latest Ref Rng 03/24/2013  Sodium     135 - 145 mEq/L 138  Potassium     3.5 - 5.1 mEq/L 3.6  Chloride     96 - 112 mEq/L 102  CO2     19 - 32 mEq/L 29  Glucose     70 - 99 mg/dL 98  BUN     6 - 23 mg/dL 10  Creatinine     0.4 - 1.2 mg/dL 0.6  Calcium     8.4 - 10.5 mg/dL 9.4  GFR     >09.81 mL/min 107.05  Epinephrine      REPORT  Norepinephrine      321  Dopamine      REPORT  Catecholamines, Total      321  PRA LC/MS/MS     0.25 - 5.82 ng/mL/h 5.33  ALDO / PRA Ratio     0.9 - 28.9 Ratio 9.9  ALDOSTERONE      53 (H)  Metanephrine, Free     <=57 pg/mL <25  Normetanephrine, Free     <=148 pg/mL 58  Total Metanephrines-Plasma     <=205 pg/mL 58  Magnesium     1.5 - 2.5 mg/dL 2.2   No indication of pheochromocytoma, so no need to stop clonidine and retest.  Aldosterone level is elevated, however, the PRA is also elevated, indicating secondary hyperaldosteronism. However, the patient is on HCTZ, which might elevate  renin activity. I  discussed with her to try to stop HCTZ for 3 weeks, after which we can recheck aldosterone, renin, potassium. In the meantime, we can try to use doxazosin.  I will have her call me if her blood pressure increases on this new regimen. We will need to stay off ACE inhibitors and ARBs at least until we repeat the tests. I ran the plan by pt's cardiologist >> OK to try Cardura >> started at 1 mg daily, will see if we need to increase the dose. She will return in 3 weeks for labs.   05/06/2013 Pt returned for lab draw after 3 weeks off HCTZ.  Component     Latest Ref Rng 03/24/2013 04/29/2013  PRA LC/MS/MS     0.25 - 5.82 ng/mL/h 5.33 2.36  ALDO / PRA Ratio     0.9 - 28.9 Ratio 9.9 12.3  ALDOSTERONE      53 (H) 29 (H)  Potassium     3.5 - 5.1 mEq/L 3.6 3.8   Aldosterone minimally elevated (ULN 28), with normal Aldo/PRA ratio and PRA not suppressed. Not suggestive or primary hyperaldosteronism. Will let pt know.  Msg sent: Dear Ms Criss Alvine, I finally got the results back and it looks like you do not have primary hyperaldosteronism. Your aldosterone is slightly high, at 29 (upper limit of normal is 28), however your plasma renin activity is not suppressed, and the ratio between aldosterone and plasma renin activity is not increased >20. In this case, primary hyperaldosteronism is ruled out.  I think at this point, we can stop Cardura and switch back to the HCTZ, unless you feel that you were doing better on the Cardura. In that case, we should check with cardiology, but I believe that you can continue Cardura. Please let me know if you have any questions. Sincerely, Carlus Pavlov MD

## 2013-03-24 NOTE — Patient Instructions (Addendum)
We will schedule another appointment if labs are abnormal. I will send you the the results through MyChart. As we discussed, w might need to stop Clonidine and retest. Please stop at the lab, write your name on the list, take a seat in the waiting room, and you will be called in for blood draw.

## 2013-03-25 ENCOUNTER — Encounter: Payer: Self-pay | Admitting: Internal Medicine

## 2013-03-26 ENCOUNTER — Encounter: Payer: Self-pay | Admitting: Internal Medicine

## 2013-03-26 ENCOUNTER — Ambulatory Visit (INDEPENDENT_AMBULATORY_CARE_PROVIDER_SITE_OTHER): Payer: BC Managed Care – PPO | Admitting: Family Medicine

## 2013-03-26 ENCOUNTER — Encounter: Payer: Self-pay | Admitting: Family Medicine

## 2013-03-26 VITALS — BP 124/66 | HR 73 | Temp 97.7°F | Resp 18 | Ht 64.25 in | Wt 164.6 lb

## 2013-03-26 DIAGNOSIS — G4710 Hypersomnia, unspecified: Secondary | ICD-10-CM

## 2013-03-26 DIAGNOSIS — I10 Essential (primary) hypertension: Secondary | ICD-10-CM

## 2013-03-26 DIAGNOSIS — E876 Hypokalemia: Secondary | ICD-10-CM

## 2013-03-26 DIAGNOSIS — K117 Disturbances of salivary secretion: Secondary | ICD-10-CM

## 2013-03-26 DIAGNOSIS — R0683 Snoring: Secondary | ICD-10-CM

## 2013-03-26 DIAGNOSIS — G4700 Insomnia, unspecified: Secondary | ICD-10-CM

## 2013-03-26 MED ORDER — CLONIDINE HCL 0.1 MG PO TABS: 0.1000 mg | ORAL_TABLET | Freq: Every day | ORAL | Status: DC

## 2013-03-26 NOTE — Patient Instructions (Addendum)
Sleep Apnea  Sleep apnea is a sleep disorder characterized by abnormal pauses in breathing while you sleep. When your breathing pauses, the level of oxygen in your blood decreases. This causes you to move out of deep sleep and into light sleep. As a result, your quality of sleep is poor, and the system that carries your blood throughout your body (cardiovascular system) experiences stress. If sleep apnea remains untreated, the following conditions can develop:  High blood pressure (hypertension).  Coronary artery disease.  Inability to achieve or maintain an erection (impotence).  Impairment of your thought process (cognitive dysfunction). There are three types of sleep apnea: 1. Obstructive sleep apnea Pauses in breathing during sleep because of a blocked airway. 2. Central sleep apnea Pauses in breathing during sleep because the area of the brain that controls your breathing does not send the correct signals to the muscles that control breathing. 3. Mixed sleep apnea A combination of both obstructive and central sleep apnea. RISK FACTORS The following risk factors can increase your risk of developing sleep apnea:  Being overweight.  Smoking.  Having narrow passages in your nose and throat.  Being of older age.  Being female.  Alcohol use.  Sedative and tranquilizer use.  Ethnicity. Among individuals younger than 35 years, African Americans are at increased risk of sleep apnea. SYMPTOMS   Difficulty staying asleep.  Daytime sleepiness and fatigue.  Loss of energy.  Irritability.  Loud, heavy snoring.  Morning headaches.  Trouble concentrating.  Forgetfulness.  Decreased interest in sex. DIAGNOSIS  In order to diagnose sleep apnea, your caregiver will perform a physical examination. Your caregiver may suggest that you take a home sleep test. Your caregiver may also recommend that you spend the night in a sleep lab. In the sleep lab, several monitors record  information about your heart, lungs, and brain while you sleep. Your leg and arm movements and blood oxygen level are also recorded. TREATMENT The following actions may help to resolve mild sleep apnea:  Sleeping on your side.   Using a decongestant if you have nasal congestion.   Avoiding the use of depressants, including alcohol, sedatives, and narcotics.   Losing weight and modifying your diet if you are overweight. There also are devices and treatments to help open your airway:  Oral appliances. These are custom-made mouthpieces that shift your lower jaw forward and slightly open your bite. This opens your airway.  Devices that create positive airway pressure. This positive pressure "splints" your airway open to help you breathe better during sleep. The following devices create positive airway pressure:  Continuous positive airway pressure (CPAP) device. The CPAP device creates a continuous level of air pressure with an air pump. The air is delivered to your airway through a mask while you sleep. This continuous pressure keeps your airway open.  Nasal expiratory positive airway pressure (EPAP) device. The EPAP device creates positive air pressure as you exhale. The device consists of single-use valves, which are inserted into each nostril and held in place by adhesive. The valves create very little resistance when you inhale but create much more resistance when you exhale. That increased resistance creates the positive airway pressure. This positive pressure while you exhale keeps your airway open, making it easier to breath when you inhale again.  Bilevel positive airway pressure (BPAP) device. The BPAP device is used mainly in patients with central sleep apnea. This device is similar to the CPAP device because it also uses an air pump to deliver   continuous air pressure through a mask. However, with the BPAP machine, the pressure is set at two different levels. The pressure when you  exhale is lower than the pressure when you inhale.  Surgery. Typically, surgery is only done if you cannot comply with less invasive treatments or if the less invasive treatments do not improve your condition. Surgery involves removing excess tissue in your airway to create a wider passage way. Document Released: 06/22/2002 Document Revised: 01/01/2012 Document Reviewed: 11/08/2011 Exit[employer] Patient Information 2014 [employer]. Insomnia Insomnia is frequent trouble falling and/or staying asleep. Insomnia can be a long term problem or a short term problem. Both are common. Insomnia can be a short term problem when the wakefulness is related to a certain stress or worry. Long term insomnia is often related to ongoing stress during waking hours and/or poor sleeping habits. Overtime, sleep deprivation itself can make the problem worse. Every little thing feels more severe because you are overtired and your ability to cope is decreased. CAUSES   Stress, anxiety, and depression.  Poor sleeping habits.  Distractions such as TV in the bedroom.  Naps close to bedtime.  Engaging in emotionally charged conversations before bed.  Technical reading before sleep.  Alcohol and other sedatives. They may make the problem worse. They can hurt normal sleep patterns and normal dream activity.  Stimulants such as caffeine for several hours prior to bedtime.  Pain syndromes and shortness of breath can cause insomnia.  Exercise late at night.  Changing time zones may cause sleeping problems (jet lag). It is sometimes helpful to have someone observe your sleeping patterns. They should look for periods of not breathing during the night (sleep apnea). They should also look to see how long those periods last. If you live alone or observers are uncertain, you can also be observed at a sleep clinic where your sleep patterns will be professionally monitored. Sleep apnea requires a checkup and treatment. Give  your caregivers your medical history. Give your caregivers observations your family has made about your sleep.  SYMPTOMS   Not feeling rested in the morning.  Anxiety and restlessness at bedtime.  Difficulty falling and staying asleep. TREATMENT   Your caregiver may prescribe treatment for an underlying medical disorders. Your caregiver can give advice or help if you are using alcohol or other drugs for self-medication. Treatment of underlying problems will usually eliminate insomnia problems.  Medications can be prescribed for short time use. They are generally not recommended for lengthy use.  Over-the-counter sleep medicines are not recommended for lengthy use. They can be habit forming.  You can promote easier sleeping by making lifestyle changes such as:  Using relaxation techniques that help with breathing and reduce muscle tension.  Exercising earlier in the day.  Changing your diet and the time of your last meal. No night time snacks.  Establish a regular time to go to bed.  Counseling can help with stressful problems and worry.  Soothing music and white noise may be helpful if there are background noises you cannot remove.  Stop tedious detailed work at least one hour before bedtime. HOME CARE INSTRUCTIONS   Keep a diary. Inform your caregiver about your progress. This includes any medication side effects. See your caregiver regularly. Take note of:  Times when you are asleep.  Times when you are awake during the night.  The quality of your sleep.  How you feel the next day. This information will help your caregiver care for you.    Get out of bed if you are still awake after 15 minutes. Read or do some quiet activity. Keep the lights down. Wait until you feel sleepy and go back to bed.  Keep regular sleeping and waking hours. Avoid naps.  Exercise regularly.  Avoid distractions at bedtime. Distractions include watching television or engaging in any intense or  detailed activity like attempting to balance the household checkbook.  Develop a bedtime ritual. Keep a familiar routine of bathing, brushing your teeth, climbing into bed at the same time each night, listening to soothing music. Routines increase the success of falling to sleep faster.  Use relaxation techniques. This can be using breathing and muscle tension release routines. It can also include visualizing peaceful scenes. You can also help control troubling or intruding thoughts by keeping your mind occupied with boring or repetitive thoughts like the old concept of counting sheep. You can make it more creative like imagining planting one beautiful flower after another in your backyard garden.  During your day, work to eliminate stress. When this is not possible use some of the previous suggestions to help reduce the anxiety that accompanies stressful situations. MAKE SURE YOU:   Understand these instructions.  Will watch your condition.  Will get help right away if you are not doing well or get worse. Document Released: 06/29/2000 Document Revised: 09/24/2011 Document Reviewed: 07/30/2007 Exit[employer] Patient Information 2014 [employer].  

## 2013-03-26 NOTE — Progress Notes (Signed)
62 yo retired woman with hypertension and hypothyroidism.  Has egg allergy and is afraid of the flu shot.  Recent episode of ankle and lower leg swelling after [place].  The amlodipine seems to cause swelling when exposed to hot weather.  Swelling responds to exercise.  Also having some "choking" episodes associated with episodes of postnasal drainage.  She sometimes notes her hands fall asleep after resting and has h/o carpal tunnel syndrome.  Never had sleep study.  Notes dry mouth  Long h/o snoring.  Sleeping well with the ativan.   Objective:  NAD Alert with good eye contact. HEENT:  Unremarkable Heart: reg, no murmur Chest: clear Skin:  Normal Neck: supple, no bruit or thyromegaly 1+ pretibial edema, no calf tenderness or warmth.  Results for orders placed in visit on 03/24/13  BASIC METABOLIC PANEL      Result Value Range   Sodium 138  135 - 145 mEq/L   Potassium 3.6  3.5 - 5.1 mEq/L   Chloride 102  96 - 112 mEq/L   CO2 29  19 - 32 mEq/L   Glucose, Bld 98  70 - 99 mg/dL   BUN 10  6 - 23 mg/dL   Creatinine, Ser 0.6  0.4 - 1.2 mg/dL   Calcium 9.4  8.4 - 16.1 mg/dL   GFR 096.04  >54.09 mL/min  MAGNESIUM      Result Value Range   Magnesium 2.2  1.5 - 2.5 mg/dL    Assessment:  Since the only reaction to eggs is diarrhea and she does eat them without much problem, she should be fine with a flu vaccine.  Hypertension - Plan: cloNIDine (CATAPRES) 0.1 MG tablet  Hypokalemia  Insomnia - Plan: Nocturnal polysomnography (NPSG)  Xerostomia  Hypersomnia - Plan: Nocturnal polysomnography (NPSG)  Snoring - Plan: Nocturnal polysomnography (NPSG)  Signed, Elvina Sidle, MD

## 2013-03-27 ENCOUNTER — Other Ambulatory Visit: Payer: Self-pay | Admitting: Family Medicine

## 2013-03-27 DIAGNOSIS — I10 Essential (primary) hypertension: Secondary | ICD-10-CM

## 2013-03-27 LAB — CATECHOLAMINES, FRACTIONATED, PLASMA
Catecholamines, Total: 321 pg/mL
Norepinephrine: 321 pg/mL

## 2013-03-28 ENCOUNTER — Encounter: Payer: Self-pay | Admitting: Family Medicine

## 2013-03-28 ENCOUNTER — Other Ambulatory Visit: Payer: Self-pay | Admitting: Family Medicine

## 2013-03-28 LAB — METANEPHRINES, PLASMA
Metanephrine, Free: <25 pg/mL (ref <=57)
Normetanephrine, Free: 58 pg/mL (ref <=148)

## 2013-03-28 NOTE — Telephone Encounter (Signed)
Is it ok to change Clonidine to BID? If so I'll call [facility] and make sure they got the rx (there was another email saying [facility] didn't get rx). Thanks

## 2013-03-30 ENCOUNTER — Encounter: Payer: Self-pay | Admitting: Family Medicine

## 2013-03-30 DIAGNOSIS — I10 Essential (primary) hypertension: Secondary | ICD-10-CM

## 2013-03-30 MED ORDER — FLUTICASONE PROPIONATE 50 MCG/ACT NA SUSP: 1.0000 | Freq: Every day | NASAL | Status: DC

## 2013-03-31 ENCOUNTER — Other Ambulatory Visit: Payer: Self-pay | Admitting: *Deleted

## 2013-03-31 MED ORDER — CLONIDINE HCL 0.1 MG PO TABS: 0.1000 mg | ORAL_TABLET | Freq: Every day | ORAL | Status: DC

## 2013-04-03 ENCOUNTER — Encounter: Payer: Self-pay | Admitting: Internal Medicine

## 2013-04-03 ENCOUNTER — Encounter: Payer: Self-pay | Admitting: Family Medicine

## 2013-04-06 ENCOUNTER — Encounter: Payer: Self-pay | Admitting: Internal Medicine

## 2013-04-06 ENCOUNTER — Ambulatory Visit (INDEPENDENT_AMBULATORY_CARE_PROVIDER_SITE_OTHER): Payer: BC Managed Care – PPO | Admitting: Internal Medicine

## 2013-04-06 VITALS — BP 146/72 | HR 74 | Ht 64.5 in | Wt 163.8 lb

## 2013-04-06 DIAGNOSIS — R002 Palpitations: Secondary | ICD-10-CM

## 2013-04-06 DIAGNOSIS — R0989 Other specified symptoms and signs involving the circulatory and respiratory systems: Secondary | ICD-10-CM

## 2013-04-06 DIAGNOSIS — E7889 Other lipoprotein metabolism disorders: Secondary | ICD-10-CM

## 2013-04-06 DIAGNOSIS — I10 Essential (primary) hypertension: Secondary | ICD-10-CM

## 2013-04-06 DIAGNOSIS — E789 Disorder of lipoprotein metabolism, unspecified: Secondary | ICD-10-CM

## 2013-04-06 NOTE — Progress Notes (Signed)
OFFICE NOTE  Chief Complaint:  Labile hypertension, fatigue  Primary Care Physician: Elvina Sidle, MD  HPI:  Angelica Levine is a 62 year old female who is followed by Dr. Milus Glazier and recently by Dr. Dietrich Pates with cardiology (seen in June 2014).  She has a history of labile hypertension with blood pressures that were in the 190-200 range at times and difficult to control. She's been on a number of different medication regimens and was recently started on clonidine in addition to Norvasc and metoprolol. Her metoprolol was discontinued due to concerns of interaction perhaps with her alpha- blocker. She just reports palpitations, lightheadedness and dizziness and episodes of extreme fatigue which come on for no good reason and seem to be associated with her palpitations, labile blood pressures and exhaustion.  She does note that while switching to clonidine she has had more level blood pressures and is less symptomatic. She was recently referred to an endocrinologist for workup for secondary causes of hypertension. I reviewed her laboratory work which indicates no evidence for hyperaldosteronism, pheochromocytoma, or other unusual causes of labile blood pressure. This of course was in the setting of a thiazide and clonidine which can alter the results of those tests.  She's never had evaluation of her renal arteries. There is no history of chest pain that is anginal sounding, although she does get short of breath mostly associated with fatigue and exercise intolerance during these episodes that she has. There is also concern about sleep problems, and she does report having a history of snoring for years with some daytime fatigue, but no headaches upon awakening.  PMHx:  Past Medical History  Diagnosis Date  . Hypertension   . Thyroid disease   . Allergy   . Arthritis     Past Surgical History  Procedure Laterality Date  . Total knee arthroplasty    . Breast surgery    . Joint  replacement    . Tubal ligation      FAMHx:  Family History  Problem Relation Age of Onset  . Hyperlipidemia Mother   . Hypertension Mother   . Heart disease Father   . Diabetes Father   . Hyperlipidemia Sister   . Hypertension Maternal Grandmother   . Diabetes Paternal Grandmother   . Heart disease Paternal Grandmother   . Heart disease Paternal Grandfather     SOCHx:   reports that she has never smoked. She has never used smokeless tobacco. She reports that she does not drink alcohol or use illicit drugs.  ALLERGIES:  Allergies  Allergen Reactions  . Losartan Shortness Of Breath  . Shrimp [Shellfish Allergy] Anaphylaxis  . Asa [Aspirin] Diarrhea    Stabbing pain in intestines  . Eggs Or Egg-Derived Products Other (See Comments)    Reaction unknown  . Lisinopril Cough  . Penicillins Hives  . Sudafed [Pseudoephedrine Hcl] Hypertension    ROS: A comprehensive review of systems was negative except for: Constitutional: positive for fatigue Respiratory: positive for dyspnea on exertion Cardiovascular: positive for palpitations  HOME MEDS: Current Outpatient Prescriptions  Medication Sig Dispense Refill  . amLODipine (NORVASC) 5 MG tablet Take 1 tablet (5 mg total) by mouth 2 (two) times daily.  60 tablet  1  . Azelaic Acid (FINACEA EX) Apply 1 application topically 2 (two) times daily.       Marland Kitchen azelastine (ASTELIN) 137 MCG/SPRAY nasal spray Place 1 spray into the nose 2 (two) times daily. Use in each nostril as directed      .  cetirizine (ZYRTEC) 10 MG tablet Take 10 mg by mouth daily.      . cloNIDine (CATAPRES) 0.1 MG tablet Take 1 tablet (0.1 mg total) by mouth daily.  90 tablet  3  . fluticasone (FLONASE) 50 MCG/ACT nasal spray Place 1 spray into the nose daily. One spray in each nostril.  16 g  7  . levothyroxine (SYNTHROID, LEVOTHROID) 50 MCG tablet Take 1 tablet (50 mcg total) by mouth daily.  90 tablet  1  . LORazepam (ATIVAN) 0.5 MG tablet Take 1 tablet (0.5 mg  total) by mouth at bedtime as needed for anxiety.  30 tablet  3  . potassium chloride SA (K-DUR,KLOR-CON) 20 MEQ tablet Take 20 mEq by mouth once a week. 1 tablet on Sunday      . triamterene-hydrochlorothiazide (MAXZIDE-25) 37.5-25 MG per tablet Take 0.5 tablets by mouth daily.       No current facility-administered medications for this visit.    LABS/IMAGING: No results found for this or any previous visit (from the past 48 hour(s)). No results found.  VITALS: BP 146/72  Pulse 74  Ht 5' 4.5" (1.638 m)  Wt 163 lb 12.8 oz (74.299 kg)  BMI 27.69 kg/m2  EXAM: General appearance: alert and no distress Neck: no adenopathy, no carotid bruit, no JVD, supple, symmetrical, trachea midline and thyroid not enlarged, symmetric, no tenderness/mass/nodules Lungs: clear to auscultation bilaterally Heart: regular rate and rhythm, S1, S2 normal, no murmur, click, rub or gallop Abdomen: soft, non-tender; bowel sounds normal; no masses,  no organomegaly Extremities: extremities normal, atraumatic, no cyanosis or edema Pulses: 2+ and symmetric Skin: Skin color, texture, turgor normal. No rashes or lesions Neurologic: Grossly normal  EKG: Sinus rhythm at 74  ASSESSMENT: 1. Labile hypertension, secondary to unknown cause 2. Recurrent episodes of fatigue associated with tachycardia and hypertension, possibly chronic fatigue syndrome 3. Hyperlipidemia - diet controlled  PLAN: 1.   [name] has had improvement in her blood pressures although she reports there is some lability. I tried to reassure her that her blood pressure will very throughout the day, especially given different stressors and activities. This should not prove to be a source of origin for her or necessitate frequent blood pressure checks or calls to her physician. She's undergone a thorough workup by Dr. Tenny Craw including an echocardiogram and recently had a stress test in the hospital in 2013 which was negative for ischemia. She wore a  monitor for her palpitations which was unrevealing. I think our current strategy for blood pressure control is good. If she needs better daytime blood pressure control she can consider taking the clonidine 3 times daily (q8 hrs) for more even coverage or perhaps this can be changed to a patch? It does appear that her secondary workup for hypertension was fairly complete except for renal Dopplers. Although it seems unlikely and I did not note any renal bruit on exam, she could have fibromuscular dysplasia and it is worth ruling out renal artery stenosis. We will go head and obtain renal Dopplers for this. If these are negative, I would continue her current medical regimen. I think this could be adequately managed in your office.  Thank you for referring Ms. Waxman for a second opinion regarding management of her hypertension.  Follow up with [place] as needed.  Chrystie Nose, MD, [facility] Attending Cardiologist [facility]  HILTY,Kenneth C 04/06/2013, 12:18 PM

## 2013-04-06 NOTE — Patient Instructions (Addendum)
Dr Rennis Golden has ordered a renal artery doppler. We will call you with the results. You can follow up with Dr. Rennis Golden as needed.

## 2013-04-07 ENCOUNTER — Encounter: Payer: Self-pay | Admitting: Internal Medicine

## 2013-04-07 ENCOUNTER — Encounter: Payer: Self-pay | Admitting: Family Medicine

## 2013-04-07 ENCOUNTER — Telehealth: Payer: Self-pay | Admitting: Internal Medicine

## 2013-04-07 MED ORDER — DOXAZOSIN MESYLATE 1 MG PO TABS: 1.0000 mg | ORAL_TABLET | Freq: Every day | ORAL | Status: DC

## 2013-04-09 ENCOUNTER — Ambulatory Visit (HOSPITAL_COMMUNITY)
Admission: RE | Admit: 2013-04-09 | Discharge: 2013-04-09 | Disposition: A | Discharge status: Home / Self Care | Payer: BC Managed Care – PPO | Source: Ambulatory Visit | Attending: Cardiovascular Disease | Admitting: Cardiovascular Disease

## 2013-04-09 DIAGNOSIS — R0989 Other specified symptoms and signs involving the circulatory and respiratory systems: Secondary | ICD-10-CM

## 2013-04-09 DIAGNOSIS — I10 Essential (primary) hypertension: Secondary | ICD-10-CM

## 2013-04-09 NOTE — Progress Notes (Signed)
Renal Artery Duplex Completed. °Brianna L Mazza,RVT °

## 2013-04-10 ENCOUNTER — Encounter: Payer: Self-pay | Admitting: Internal Medicine

## 2013-04-10 ENCOUNTER — Telehealth: Payer: Self-pay

## 2013-04-10 ENCOUNTER — Institutional Professional Consult (permissible substitution): Payer: BC Managed Care – PPO | Admitting: Neurology

## 2013-04-10 ENCOUNTER — Telehealth: Payer: Self-pay | Admitting: *Deleted

## 2013-04-10 NOTE — Telephone Encounter (Signed)
I called patient. Provider will not be in today. Need to reschedule. Appointment rescheduled for Monday Sept 29, 2014 at 1:30 a.m.

## 2013-04-10 NOTE — Telephone Encounter (Signed)
Pt called and stated her heart rate was up yesterday and would not come down. Pt wants to know if she needs to up her Cardura. Please advise.

## 2013-04-13 ENCOUNTER — Ambulatory Visit (INDEPENDENT_AMBULATORY_CARE_PROVIDER_SITE_OTHER): Payer: BC Managed Care – PPO | Admitting: Neurology

## 2013-04-13 ENCOUNTER — Encounter: Payer: Self-pay | Admitting: Neurology

## 2013-04-13 ENCOUNTER — Encounter: Payer: Self-pay | Admitting: Family Medicine

## 2013-04-13 VITALS — BP 116/66 | HR 72 | Resp 16 | Ht 64.0 in | Wt 162.0 lb

## 2013-04-13 DIAGNOSIS — R0989 Other specified symptoms and signs involving the circulatory and respiratory systems: Secondary | ICD-10-CM

## 2013-04-13 DIAGNOSIS — R0683 Snoring: Secondary | ICD-10-CM

## 2013-04-13 DIAGNOSIS — G4710 Hypersomnia, unspecified: Secondary | ICD-10-CM | POA: Insufficient documentation

## 2013-04-13 DIAGNOSIS — R0609 Other forms of dyspnea: Secondary | ICD-10-CM

## 2013-04-13 DIAGNOSIS — L719 Rosacea, unspecified: Secondary | ICD-10-CM

## 2013-04-13 DIAGNOSIS — L718 Other rosacea: Secondary | ICD-10-CM

## 2013-04-13 DIAGNOSIS — I10 Essential (primary) hypertension: Secondary | ICD-10-CM

## 2013-04-13 DIAGNOSIS — T17310S Gastric contents in larynx causing asphyxiation, sequela: Secondary | ICD-10-CM

## 2013-04-13 DIAGNOSIS — H10829 Rosacea conjunctivitis, unspecified eye: Secondary | ICD-10-CM

## 2013-04-13 HISTORY — DX: Other rosacea: L71.8

## 2013-04-13 HISTORY — DX: Hypersomnia, unspecified: G47.10

## 2013-04-13 HISTORY — DX: Rosacea conjunctivitis, unspecified eye: H10.829

## 2013-04-13 NOTE — Progress Notes (Signed)
Chief Complaint  Patient presents with  . New Evaluation    hypersomnia,snoring, lauenstein, rm 10   [facility]  Provider:  Melvyn Novas, M D  Referring Provider: Elvina Sidle, MD Primary Care Physician:  Elvina Sidle, MD  Chief Complaint  Patient presents with  . New Evaluation    hypersomnia,snoring, lauenstein, rm 10    HPI:  Angelica Levine is a 62 y.o. female  Is seen here as a referral/ revisit  from Dr. Milus Glazier for evaluation of possible OSA.   Mrs.Mottern is here after her cardiologist commented on her ankle edema and her variable heart rate and since last June , with variable hypertension. She had suffered from a viral infect and developed sinusitis in January, developed severe trouble breathing at night. Her sleep has been fragmented, and she choked some nights, woke up with palpitations.  She believed that she   screamed in sleep , around March 2014 -  Always when she had phlegm in her throat -This alarmed Dr Milus Glazier.     Mrs. Salehi's husband has also noted that she seems to produce choking sounds at night, She denies having vivid dreams or nightmares dreams, she has worsened ankle and lower leg swelling the summer. And she had some episodes of postnasal drainage that seemed to have problems with choking episodes at night. She never had a sleep study so far but she noticed a dry mouth in the morning and again there is a long history of snoring . She is sleeping well with Ativan, but Dr. Faustino Congress has provided, for 3 hours with one half tab.    Patient reports that around 10 PM she will feel, that she needs to go to bed,  This is  early in comparison to her sleep initiation time in the last year . She takes clonidine since June 28th, generic form of Catapres 0.1 mg tablets.  Initially she was very sedated on this medication and was want by Dr. Faustino Congress not to drive. He took her of Toprol XR and gave her bid Dose of catapres.   By now she  feels stable enough to operate machinery when taking it be tired enough. The patient reports sleeping for about 3 hours en bloc before waking up, mostly by palpitations. It is about time that she noticed a postnasal drainage. She uses Flonase now in the evening score at nighttime which has not made a difference.  She has often only 3-4 hours of sleep all night.  The patient will rise around 7AM , often already awake for hours, and takes her morning medication. She does not need an alarm.  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 The patient takes daily of 15-30 minute nap, a short power nap.  Epworth sleepiness score and was at 10 pints fatigue severity score at 31 points depression score at 2 points. Medication list reviewed. Patient is referred for a renal artery ultrasound to rule out renal artery stenosis. The patient retired in 2000 and after a total knee replacement, reportedly sleeping well. The current sleep difficulties seem all to have arisen with the variable blood pressures and palpitations beginning last June.     Review of Systems: Out of a complete 14 system review, the patient complains of only the following symptoms, and all other reviewed systems are negative. Fatigue, sleepiness, HTN,   History   Social History  . Marital Status: Married    Spouse Name: [name]  Number of Children: 2  . Years of Education: 7   Occupational History  .      retired   Social History Main Topics  . Smoking status: Never Smoker   . Smokeless tobacco: Never Used  . Alcohol Use: No     Comment: wine once in a while  . Drug Use: No  . Sexual Activity: Yes    Partners: Male   Other Topics Concern  . Not on file   Social History Narrative   Married. Education: [employer].    Exercise: 2 times a week; walk and gardening   Caffeine use: none   Patient is right handed             Family History  Problem Relation Age of Onset  . Hyperlipidemia  Mother   . Hypertension Mother   . Heart disease Father   . Diabetes Father   . Hyperlipidemia Sister   . High Cholesterol Sister   . Hypertension Maternal Grandmother   . Diabetes Maternal Grandmother   . Heart disease Maternal Grandmother   . Diabetes Paternal Grandmother   . Heart disease Paternal Grandmother   . Cancer - Lung Paternal Grandmother   . Heart disease Paternal Grandfather   . Alzheimer's disease Maternal Grandfather     Past Medical History  Diagnosis Date  . Hypertension   . Thyroid disease   . Allergy   . Arthritis   . High cholesterol     diet control  . Broken hip 1985    Past Surgical History  Procedure Laterality Date  . Total knee arthroplasty Bilateral     left -2009,right-2003  . Breast surgery Left 1987  . Joint replacement    . Tubal ligation  1984  . Tonsillectomy  1956    Current Outpatient Prescriptions  Medication Sig Dispense Refill  . amLODipine (NORVASC) 5 MG tablet Take 1 tablet (5 mg total) by mouth 2 (two) times daily.  60 tablet  1  . Azelaic Acid (FINACEA EX) Apply 1 application topically 2 (two) times daily.       Marland Kitchen azelastine (ASTELIN) 137 MCG/SPRAY nasal spray Place 1 spray into the nose 2 (two) times daily. Use in each nostril as directed      . cetirizine (ZYRTEC) 10 MG tablet Take 10 mg by mouth daily.      . cloNIDine (CATAPRES) 0.1 MG tablet Take 0.1 mg by mouth every 12 (twelve) hours.      Marland Kitchen doxazosin (CARDURA) 1 MG tablet Take 1 tablet (1 mg total) by mouth at bedtime.  30 tablet  0  . fluticasone (FLONASE) 50 MCG/ACT nasal spray Place 1 spray into the nose daily. One spray in each nostril.  16 g  7  . levothyroxine (SYNTHROID, LEVOTHROID) 50 MCG tablet Take 1 tablet (50 mcg total) by mouth daily.  90 tablet  1  . LORazepam (ATIVAN) 0.5 MG tablet Take 1 tablet (0.5 mg total) by mouth at bedtime as needed for anxiety.  30 tablet  3  . potassium chloride SA (K-DUR,KLOR-CON) 20 MEQ tablet Take 20 mEq by mouth once a week.  1 tablet on Sunday       No current facility-administered medications for this visit.    Allergies as of 04/13/2013 - Review Complete 04/13/2013  Allergen Reaction Noted  . Losartan Shortness Of Breath 02/15/2012  . Shrimp [shellfish allergy] Anaphylaxis 11/19/2011  . Asa [aspirin] Diarrhea 11/19/2011  . Eggs or egg-derived products Other (See Comments) 01/09/2012  .  Lisinopril Cough 02/14/2012  . Penicillins Hives 11/19/2011  . Sudafed [pseudoephedrine hcl] Hypertension 11/19/2011    Vitals: BP 116/66  Pulse 72  Resp 16  Ht 5\' 4"  (1.626 m)  Wt 162 lb (73.483 kg)  BMI 27.79 kg/m2 Last Weight:  Wt Readings from Last 1 Encounters:  04/13/13 162 lb (73.483 kg)   Last Height:   Ht Readings from Last 1 Encounters:  04/13/13 5\' 4"  (1.626 m)    Physical exam:  General: The patient is awake, alert and appears not in acute distress. The patient is well groomed. Head: Normocephalic, atraumatic. Neck is supple. Mallampati 3, neck circumference: 15 inches, no nasal deviation.  Cardiovascular:  Regular rate and rhythm , without  murmurs or carotid bruit, and without distended neck veins. Respiratory: Lungs are clear to auscultation. Skin:  Without evidence of edema, or rash. Facial skin with coarse , pale appearance, she has ciliary injection into both eyes, dry eyes.  Trunk: BMI is elevated . The patient  has normal posture.  Neurologic exam : The patient is awake and alert, oriented to place and time.   Memory subjective described as intact. There is a normal attention span & concentration ability.  Speech is fluent without dysarthria, dysphonia or aphasia. Mood and affect are appropriate.  Cranial nerves: Pupils are equal and briskly reactive to light. Funduscopic exam without  evidence of pallor or edema. Extraocular movements  in vertical and horizontal planes intact and without nystagmus. Visual fields by finger perimetry are intact. Hearing to finger rub intact.  Facial  sensation intact to fine touch. Facial motor strength is symmetric and tongue and uvula move midline.  Motor exam: Normal tone and normal muscle bulk and symmetric normal strength in all extremities.  Sensory:  Fine touch, pinprick and vibration were tested in all extremities. Proprioception is normal.  Coordination: Rapid alternating movements in the fingers/hands is tested and normal. Finger-to-nose maneuver without evidence of ataxia, dysmetria or tremor- she is very slow . Marland Kitchen  Gait and station: Patient walks without assistive device and is able and assisted stool climb up to the exam table. Strength within normal limits. Has orthostatic dizziness, lightheadnes - Stance is stable and normal. Tandem gait is unfragmented.  Deep tendon reflexes: in the  upper and lower extremities are symmetric and intact.  Achilles is attenuated. Babinski maneuver response is downgoing.   Assessment:  After physical and neurologic examination, review of  pre-existing records, assessment is that of a patient with a 12-15 month history of developing  1)sleep problems. Nocturnal palpitations, insomnia but snoring and dry mouth - choking . Patient appears physically fatigued.    2)dysautonomia ,HTN  on multiple medications. Possible renal artery stenosis and Pheochromocytoma were worked up.   Plan:  Treatment plan and additional workup : split study with patient sleep aid at hand ( ativan ) .  Patent received apnea : Information and insomnia information,   Risk factors for CAD , CVA and hypoxemia.  concerned about palpitation and HTN waking this patient out of sleep-  And no anxiety - vivid dreams that may cause and explain this.  Chronic sleep deprivation.  CcDr.  Milus Glazier, Dr Particia Lather , Dr. Rennis Golden .  BCBS : Split at [facility] , due to co-morbidities. 3% scoring.

## 2013-04-13 NOTE — Patient Instructions (Signed)
Sleep Apnea  Sleep apnea is a sleep disorder characterized by abnormal pauses in breathing while you sleep. When your breathing pauses, the level of oxygen in your blood decreases. This causes you to move out of deep sleep and into light sleep. As a result, your quality of sleep is poor, and the system that carries your blood throughout your body (cardiovascular system) experiences stress. If sleep apnea remains untreated, the following conditions can develop:  High blood pressure (hypertension).  Coronary artery disease.  Inability to achieve or maintain an erection (impotence).  Impairment of your thought process (cognitive dysfunction). There are three types of sleep apnea: 1. Obstructive sleep apnea Pauses in breathing during sleep because of a blocked airway. 2. Central sleep apnea Pauses in breathing during sleep because the area of the brain that controls your breathing does not send the correct signals to the muscles that control breathing. 3. Mixed sleep apnea A combination of both obstructive and central sleep apnea. RISK FACTORS The following risk factors can increase your risk of developing sleep apnea:  Being overweight.  Smoking.  Having narrow passages in your nose and throat.  Being of older age.  Being female.  Alcohol use.  Sedative and tranquilizer use.  Ethnicity. Among individuals younger than 35 years, African Americans are at increased risk of sleep apnea. SYMPTOMS   Difficulty staying asleep.  Daytime sleepiness and fatigue.  Loss of energy.  Irritability.  Loud, heavy snoring.  Morning headaches.  Trouble concentrating.  Forgetfulness.  Decreased interest in sex. DIAGNOSIS  In order to diagnose sleep apnea, your caregiver will perform a physical examination. Your caregiver may suggest that you take a home sleep test. Your caregiver may also recommend that you spend the night in a sleep lab. In the sleep lab, several monitors record  information about your heart, lungs, and brain while you sleep. Your leg and arm movements and blood oxygen level are also recorded. TREATMENT The following actions may help to resolve mild sleep apnea:  Sleeping on your side.   Using a decongestant if you have nasal congestion.   Avoiding the use of depressants, including alcohol, sedatives, and narcotics.   Losing weight and modifying your diet if you are overweight. There also are devices and treatments to help open your airway:  Oral appliances. These are custom-made mouthpieces that shift your lower jaw forward and slightly open your bite. This opens your airway.  Devices that create positive airway pressure. This positive pressure "splints" your airway open to help you breathe better during sleep. The following devices create positive airway pressure:  Continuous positive airway pressure (CPAP) device. The CPAP device creates a continuous level of air pressure with an air pump. The air is delivered to your airway through a mask while you sleep. This continuous pressure keeps your airway open.  Nasal expiratory positive airway pressure (EPAP) device. The EPAP device creates positive air pressure as you exhale. The device consists of single-use valves, which are inserted into each nostril and held in place by adhesive. The valves create very little resistance when you inhale but create much more resistance when you exhale. That increased resistance creates the positive airway pressure. This positive pressure while you exhale keeps your airway open, making it easier to breath when you inhale again.  Bilevel positive airway pressure (BPAP) device. The BPAP device is used mainly in patients with central sleep apnea. This device is similar to the CPAP device because it also uses an air pump to deliver  continuous air pressure through a mask. However, with the BPAP machine, the pressure is set at two different levels. The pressure when you  exhale is lower than the pressure when you inhale.  Surgery. Typically, surgery is only done if you cannot comply with less invasive treatments or if the less invasive treatments do not improve your condition. Surgery involves removing excess tissue in your airway to create a wider passage way. Document Released: 06/22/2002 Document Revised: 01/01/2012 Document Reviewed: 11/08/2011 [facility] Patient Information 2014 [place], [place]. Insomnia Insomnia is frequent trouble falling and/or staying asleep. Insomnia can be a long term problem or a short term problem. Both are common. Insomnia can be a short term problem when the wakefulness is related to a certain stress or worry. Long term insomnia is often related to ongoing stress during waking hours and/or poor sleeping habits. Overtime, sleep deprivation itself can make the problem worse. Every little thing feels more severe because you are overtired and your ability to cope is decreased. CAUSES   Stress, anxiety, and depression.  Poor sleeping habits.  Distractions such as TV in the bedroom.  Naps close to bedtime.  Engaging in emotionally charged conversations before bed.  Technical reading before sleep.  Alcohol and other sedatives. They may make the problem worse. They can hurt normal sleep patterns and normal dream activity.  Stimulants such as caffeine for several hours prior to bedtime.  Pain syndromes and shortness of breath can cause insomnia.  Exercise late at night.  Changing time zones may cause sleeping problems (jet lag). It is sometimes helpful to have someone observe your sleeping patterns. They should look for periods of not breathing during the night (sleep apnea). They should also look to see how long those periods last. If you live alone or observers are uncertain, you can also be observed at a sleep clinic where your sleep patterns will be professionally monitored. Sleep apnea requires a checkup and treatment. Give  your caregivers your medical history. Give your caregivers observations your family has made about your sleep.  SYMPTOMS   Not feeling rested in the morning.  Anxiety and restlessness at bedtime.  Difficulty falling and staying asleep. TREATMENT   Your caregiver may prescribe treatment for an underlying medical disorders. Your caregiver can give advice or help if you are using alcohol or other drugs for self-medication. Treatment of underlying problems will usually eliminate insomnia problems.  Medications can be prescribed for short time use. They are generally not recommended for lengthy use.  Over-the-counter sleep medicines are not recommended for lengthy use. They can be habit forming.  You can promote easier sleeping by making lifestyle changes such as:  Using relaxation techniques that help with breathing and reduce muscle tension.  Exercising earlier in the day.  Changing your diet and the time of your last meal. No night time snacks.  Establish a regular time to go to bed.  Counseling can help with stressful problems and worry.  Soothing music and white noise may be helpful if there are background noises you cannot remove.  Stop tedious detailed work at least one hour before bedtime. HOME CARE INSTRUCTIONS   Keep a diary. Inform your caregiver about your progress. This includes any medication side effects. See your caregiver regularly. Take note of:  Times when you are asleep.  Times when you are awake during the night.  The quality of your sleep.  How you feel the next day. This information will help your caregiver care for you.  Get out of bed if you are still awake after 15 minutes. Read or do some quiet activity. Keep the lights down. Wait until you feel sleepy and go back to bed.  Keep regular sleeping and waking hours. Avoid naps.  Exercise regularly.  Avoid distractions at bedtime. Distractions include watching television or engaging in any intense or  detailed activity like attempting to balance the household checkbook.  Develop a bedtime ritual. Keep a familiar routine of bathing, brushing your teeth, climbing into bed at the same time each night, listening to soothing music. Routines increase the success of falling to sleep faster.  Use relaxation techniques. This can be using breathing and muscle tension release routines. It can also include visualizing peaceful scenes. You can also help control troubling or intruding thoughts by keeping your mind occupied with boring or repetitive thoughts like the old concept of counting sheep. You can make it more creative like imagining planting one beautiful flower after another in your backyard garden.  During your day, work to eliminate stress. When this is not possible use some of the previous suggestions to help reduce the anxiety that accompanies stressful situations. MAKE SURE YOU:   Understand these instructions.  Will watch your condition.  Will get help right away if you are not doing well or get worse. Document Released: 06/29/2000 Document Revised: 09/24/2011 Document Reviewed: 07/30/2007 [employer] Patient Information 2014 [employer].  

## 2013-04-16 ENCOUNTER — Encounter: Payer: Self-pay | Admitting: Internal Medicine

## 2013-04-21 ENCOUNTER — Encounter: Payer: Self-pay | Admitting: Internal Medicine

## 2013-04-26 ENCOUNTER — Encounter: Payer: Self-pay | Admitting: Family Medicine

## 2013-04-29 ENCOUNTER — Other Ambulatory Visit (INDEPENDENT_AMBULATORY_CARE_PROVIDER_SITE_OTHER): Payer: BC Managed Care – PPO

## 2013-04-29 DIAGNOSIS — I10 Essential (primary) hypertension: Secondary | ICD-10-CM

## 2013-04-29 LAB — POTASSIUM: Potassium: 3.8 mEq/L (ref 3.5–5.1)

## 2013-04-30 ENCOUNTER — Ambulatory Visit (INDEPENDENT_AMBULATORY_CARE_PROVIDER_SITE_OTHER): Payer: BC Managed Care – PPO | Admitting: Neurology

## 2013-04-30 DIAGNOSIS — T17310S Gastric contents in larynx causing asphyxiation, sequela: Secondary | ICD-10-CM

## 2013-04-30 DIAGNOSIS — I10 Essential (primary) hypertension: Secondary | ICD-10-CM

## 2013-04-30 DIAGNOSIS — R0683 Snoring: Secondary | ICD-10-CM

## 2013-04-30 DIAGNOSIS — G4734 Idiopathic sleep related nonobstructive alveolar hypoventilation: Secondary | ICD-10-CM

## 2013-05-01 ENCOUNTER — Encounter: Payer: Self-pay | Admitting: Neurology

## 2013-05-01 ENCOUNTER — Encounter: Payer: Self-pay | Admitting: Internal Medicine

## 2013-05-01 ENCOUNTER — Other Ambulatory Visit: Payer: Self-pay | Admitting: Internal Medicine

## 2013-05-01 DIAGNOSIS — I10 Essential (primary) hypertension: Secondary | ICD-10-CM

## 2013-05-01 MED ORDER — DOXAZOSIN MESYLATE 1 MG PO TABS: 1.0000 mg | ORAL_TABLET | Freq: Every day | ORAL | Status: DC

## 2013-05-05 LAB — ALDOSTERONE + RENIN ACTIVITY W/ RATIO
ALDO / PRA Ratio: 12.3 Ratio (ref 0.9–28.9)
Aldosterone: 29 ng/dL — ABNORMAL HIGH

## 2013-05-07 ENCOUNTER — Encounter: Payer: Self-pay | Admitting: Internal Medicine

## 2013-05-07 ENCOUNTER — Encounter: Payer: Self-pay | Admitting: Family Medicine

## 2013-05-07 ENCOUNTER — Ambulatory Visit (INDEPENDENT_AMBULATORY_CARE_PROVIDER_SITE_OTHER): Payer: BC Managed Care – PPO | Admitting: Family Medicine

## 2013-05-07 VITALS — BP 115/67 | HR 71 | Temp 97.0°F | Resp 16 | Ht 65.0 in | Wt 167.0 lb

## 2013-05-07 DIAGNOSIS — I10 Essential (primary) hypertension: Secondary | ICD-10-CM

## 2013-05-07 DIAGNOSIS — R600 Localized edema: Secondary | ICD-10-CM

## 2013-05-07 DIAGNOSIS — R609 Edema, unspecified: Secondary | ICD-10-CM

## 2013-05-07 DIAGNOSIS — E559 Vitamin D deficiency, unspecified: Secondary | ICD-10-CM

## 2013-05-07 NOTE — Progress Notes (Signed)
Patient ID: Angelica Levine MRN: [id number], DOB: 02-09-1951, 62 y.o. Date of Encounter: 05/07/2013, 11:15 AM  Primary Physician: Elvina Sidle, MD  Chief Complaint: HTN  HPI: 62 y.o. year old female with history below presents for hypertension follow up.  Knees are hurting, but with exercise the pain is less. No CP, HA, visual changes, or focal deficits.   Patient brings in notebook with her many blood pressure checks which she reviewed.  Still having episodic fluctuations of pulse and blood pressure.  Past Medical History  Diagnosis Date  . Hypertension   . Thyroid disease   . Allergy   . Arthritis   . High cholesterol     diet control  . Broken hip 1985  . Sleep apnea with hypersomnolence 04/13/2013  . Rosacea conjunctivitis 04/13/2013     Home Meds: Prior to Admission medications   Medication Sig Start Date End Date Taking? Authorizing Provider  amLODipine (NORVASC) 5 MG tablet Take 1 tablet (5 mg total) by mouth 2 (two) times daily. 03/18/13  Yes Eleanore E Egan, PA-C  Azelaic Acid (FINACEA EX) Apply 1 application topically 2 (two) times daily.    Yes Historical Provider, MD  azelastine (ASTELIN) 137 MCG/SPRAY nasal spray Place 1 spray into the nose 2 (two) times daily. Use in each nostril as directed   Yes Historical Provider, MD  cloNIDine (CATAPRES) 0.1 MG tablet Take 0.1 mg by mouth every 12 (twelve) hours. 03/31/13  Yes Elvina Sidle, MD  doxazosin (CARDURA) 1 MG tablet Take 1 tablet (1 mg total) by mouth at bedtime. 05/01/13  Yes Carlus Pavlov, MD  fluticasone (FLONASE) 50 MCG/ACT nasal spray Place 1 spray into the nose daily. One spray in each nostril. 03/30/13  Yes Elvina Sidle, MD  levothyroxine (SYNTHROID, LEVOTHROID) 50 MCG tablet Take 1 tablet (50 mcg total) by mouth daily. 02/21/13  Yes Elvina Sidle, MD  LORazepam (ATIVAN) 0.5 MG tablet Take 1 tablet (0.5 mg total) by mouth at bedtime as needed for anxiety. 01/24/13  Yes Elvina Sidle, MD  potassium  chloride SA (K-DUR,KLOR-CON) 20 MEQ tablet Take 20 mEq by mouth once a week. 1 tablet on Sunday 02/14/13   Elvina Sidle, MD    Allergies:  Allergies  Allergen Reactions  . Losartan Shortness Of Breath  . Shrimp [Shellfish Allergy] Anaphylaxis  . Asa [Aspirin] Diarrhea    Stabbing pain in intestines  . Eggs Or Egg-Derived Products Other (See Comments)    Reaction unknown  . Lisinopril Cough  . Penicillins Hives  . Sudafed [Pseudoephedrine Hcl] Hypertension    History   Social History  . Marital Status: Married    Spouse Name: [name]    Number of Children: 2  . Years of Education: 7   Occupational History  .      retired   Social History Main Topics  . Smoking status: Never Smoker   . Smokeless tobacco: Never Used  . Alcohol Use: No     Comment: wine once in a while  . Drug Use: No  . Sexual Activity: Yes    Partners: Male   Other Topics Concern  . Not on file   Social History Narrative   Married. Education: [employer].    Exercise: 2 times a week; walk and gardening   Caffeine use: none   Patient is right handed              Family History  Problem Relation Age of Onset  . Hyperlipidemia Mother   . Hypertension Mother   .  Heart disease Father   . Diabetes Father   . Hyperlipidemia Sister   . High Cholesterol Sister   . Hypertension Maternal Grandmother   . Diabetes Maternal Grandmother   . Heart disease Maternal Grandmother   . Diabetes Paternal Grandmother   . Heart disease Paternal Grandmother   . Cancer - Lung Paternal Grandmother   . Heart disease Paternal Grandfather   . Alzheimer's disease Maternal Grandfather     Review of Systems: Constitutional: negative for chills, fever, night sweats, weight changes, or fatigue  HEENT: negative for vision changes, hearing loss, congestion, rhinorrhea, ST, epistaxis, or sinus pressure Cardiovascular: negative for chest pain, palpitations, or DOE Respiratory: negative for hemoptysis, wheezing, shortness  of breath, or cough Abdominal: negative for abdominal pain, nausea, vomiting, diarrhea, or constipation Dermatological: negative for rash Neurologic: negative for headache, dizziness, or syncope All other systems reviewed and are otherwise negative with the exception to those above and in the HPI.  Patient has h/o vitamin D deficiency, last checked April Physical Exam: Blood pressure 115/67, pulse 71, temperature 97 F (36.1 C), resp. rate 16, height 5\' 5"  (1.651 m), weight 167 lb (75.751 kg)., Body mass index is 27.79 kg/(m^2). General: Well developed, well nourished, in no acute distress. Head: Normocephalic, atraumatic, eyes without discharge, sclera non-icteric, nares are without discharge. Bilateral auditory canals clear, TM's are without perforation, pearly grey and translucent with reflective cone of light bilaterally. Oral cavity moist, posterior pharynx without exudate, erythema, peritonsillar abscess, or post nasal drip.  Neck: Supple. No thyromegaly. Full ROM. No lymphadenopathy. No carotid bruits. Lungs: Clear bilaterally to auscultation without wheezes, rales, or rhonchi. Breathing is unlabored. Heart: RRR with S1 S2. No murmurs, rubs, or gallops appreciated.  Abdomen: Soft, non-tender, non-distended with normoactive bowel sounds. No hepatosplenomegaly. No rebound/guarding. No obvious abdominal masses. Msk:  Strength and tone normal for age. Extremities/Skin: Warm and dry. No clubbing or cyanosis. 1+ edema. No rashes or suspicious lesions. Distal pulses 2+ and equal bilaterally. Neuro: Alert and oriented X 3. Moves all extremities spontaneously. Gait is normal. CNII-XII grossly in tact. DTR 2+, cerebellar function intact. Rhomberg normal. Psych:  Responds to questions appropriately with a normal affect.    ASSESSMENT AND PLAN:  62 y.o. year old female with hypertension. Hypertension  Pedal edema  Vitamin D deficiency - Plan: Vitamin D, 25-hydroxy  Will discontinue the  Cardura given current range of blood pressure readings. Follow up in 3 months  Signed, Elvina Sidle, MD 05/07/2013 11:15 AM

## 2013-05-08 ENCOUNTER — Encounter: Payer: Self-pay | Admitting: Family Medicine

## 2013-05-08 ENCOUNTER — Encounter: Payer: Self-pay | Admitting: Internal Medicine

## 2013-05-08 LAB — VITAMIN D 25 HYDROXY (VIT D DEFICIENCY, FRACTURES): Vit D, 25-Hydroxy: 33 ng/mL (ref 30–89)

## 2013-05-10 ENCOUNTER — Encounter: Payer: Self-pay | Admitting: Family Medicine

## 2013-05-11 ENCOUNTER — Encounter: Payer: Self-pay | Admitting: Family Medicine

## 2013-05-11 NOTE — Telephone Encounter (Signed)
ERROR

## 2013-05-14 ENCOUNTER — Encounter: Payer: Self-pay | Admitting: Family Medicine

## 2013-05-15 ENCOUNTER — Telehealth: Payer: Self-pay

## 2013-05-15 DIAGNOSIS — I10 Essential (primary) hypertension: Secondary | ICD-10-CM

## 2013-05-15 NOTE — Telephone Encounter (Signed)
PATIENT STATES WE SENT IN HER REFILL FOR CLONIDINE A FEW WEEKS AGO, BUT SHE IS JUST NOW PICKING IT UP FROM THE PHARMACY. IT WAS SENT IN FOR CLONIDINE 0.1 MG. #90. TAKE 1 TABLET 1 TIME A DAY. IT SHOULD BE CLONIDINE 0.1 MG. #60. TAKE 1 TABLET 2 TIMES A DAY. SHE SAID HER INSURANCE WILL PAY FOR 3 MONTHS, BUT SHE NEEDS TO HAVE #180. SHE USUALLY SEES DR. Milus Glazier. BEST PHONE [phone] (YOU MAY LEAVE HER A MESSAGE)   PHARMACY CHOICE IS [facility] IN [place], [place]     MBC

## 2013-05-16 ENCOUNTER — Other Ambulatory Visit: Payer: Self-pay | Admitting: Family Medicine

## 2013-05-16 DIAGNOSIS — I10 Essential (primary) hypertension: Secondary | ICD-10-CM

## 2013-05-16 MED ORDER — CLONIDINE HCL 0.1 MG PO TABS: 0.1000 mg | ORAL_TABLET | Freq: Two times a day (BID) | ORAL | Status: DC

## 2013-05-18 MED ORDER — CLONIDINE HCL 0.1 MG PO TABS: 0.1000 mg | ORAL_TABLET | Freq: Two times a day (BID) | ORAL | Status: DC

## 2013-05-18 NOTE — Telephone Encounter (Signed)
See order copied below, rx was written for BID dosing.  If pt needs 90 day supply for ins purposes, we can call pharmacy and change quantity to #180, no RF.  cloNIDine (CATAPRES) 0.1 MG tablet [[id number]]  Order Details    Dose: 0.1 mg Route: Oral Frequency: Every 12 hours   Dispense Quantity: 60 tablet Refills: 3 Fills Remaining: 3          Sig: Take 1 tablet (0.1 mg total) by mouth every 12 (twelve) hours.         Written Date: 05/16/13 Expiration Date: 05/16/14     Start Date: 05/16/13 End Date: --     Ordering Provider: -- Authorizing Provider: Elvina Sidle, MD Ordering User: Elvina Sidle, MD          Diagnosis Association: Hypertension (401.9)         Original Order: cloNIDine (CATAPRES) 0.1 MG tablet [[id number]]      Pharmacy: [id number] - [place], [place] - [address] AT [facility] [address]      Pharmacy Comments: --         Quantity Remaining: 180 tablet Quantity Filled: 0 tablet

## 2013-05-18 NOTE — Telephone Encounter (Signed)
Called her, does she need 90 day supply, yes she would like this. It has been sent.

## 2013-05-20 ENCOUNTER — Encounter: Payer: Self-pay | Admitting: Neurology

## 2013-05-21 ENCOUNTER — Other Ambulatory Visit: Payer: Self-pay

## 2013-05-25 ENCOUNTER — Encounter: Payer: Self-pay | Admitting: Neurology

## 2013-05-25 ENCOUNTER — Telehealth: Payer: Self-pay | Admitting: Neurology

## 2013-05-25 ENCOUNTER — Encounter: Payer: Self-pay | Admitting: *Deleted

## 2013-05-25 NOTE — Telephone Encounter (Signed)
I called and spoke with the patient about her sleep study results. I informed the patient that the study didn't reveal any sleep apnea, but fragmented sleep, insomnia and reduced overnight sleep time. Dr. Vickey Huger wants to start a trial of oxygen to see if it help. I will mail a copy to the patient.

## 2013-05-28 ENCOUNTER — Encounter: Payer: Self-pay | Admitting: Family Medicine

## 2013-05-28 ENCOUNTER — Encounter: Payer: Self-pay | Admitting: Neurology

## 2013-05-28 NOTE — Telephone Encounter (Signed)
Phone note completed ° °

## 2013-05-28 NOTE — Telephone Encounter (Signed)
Dear [name] ,  Please do not use MyChart for medical urgent questions- only for advise requests.  Your BP variations are concerning , Please address these with your PCP.  Do not stop your current medications , but continue to document.  Have your symptoms improved meanwhile?  Angelica Batson, MD

## 2013-05-29 ENCOUNTER — Encounter: Payer: Self-pay | Admitting: Neurology

## 2013-05-29 ENCOUNTER — Other Ambulatory Visit: Payer: Self-pay | Admitting: Family Medicine

## 2013-05-29 MED ORDER — AZELASTINE HCL 0.1 % NA SOLN: 1.0000 | Freq: Two times a day (BID) | NASAL | Status: DC

## 2013-06-03 ENCOUNTER — Other Ambulatory Visit: Payer: Self-pay | Admitting: Family Medicine

## 2013-06-03 ENCOUNTER — Encounter: Payer: Self-pay | Admitting: Family Medicine

## 2013-06-03 DIAGNOSIS — I10 Essential (primary) hypertension: Secondary | ICD-10-CM

## 2013-06-03 NOTE — Telephone Encounter (Signed)
This may indicate a cervical spine abnormality - pinching nerves or carpal tunnel syndrome. We will discuss further in your next visit. CD

## 2013-06-08 ENCOUNTER — Encounter: Payer: Self-pay | Admitting: Neurology

## 2013-06-08 NOTE — Telephone Encounter (Signed)
I called pt to relay the below. She had not received in email.   She has no appt set up in the future.  She asked about Oxygen, I relayed that I did not see order for this.   I would defer to [facility] in sleep lab.

## 2013-06-15 ENCOUNTER — Other Ambulatory Visit: Payer: Self-pay | Admitting: Physician Assistant

## 2013-06-15 ENCOUNTER — Encounter: Payer: Self-pay | Admitting: Family Medicine

## 2013-06-17 NOTE — Telephone Encounter (Signed)
You may discuss these findings with  your established Primary doctor's office or with my NP - .  The chief message was " there is evidence of some spine degeneration, sometimes injections by physiatry / rehab work well for these Impingements.

## 2013-06-17 NOTE — Telephone Encounter (Signed)
[phone].  

## 2013-06-22 ENCOUNTER — Encounter: Payer: Self-pay | Admitting: Neurology

## 2013-06-22 ENCOUNTER — Ambulatory Visit (INDEPENDENT_AMBULATORY_CARE_PROVIDER_SITE_OTHER): Payer: BC Managed Care – PPO | Admitting: Neurology

## 2013-06-22 VITALS — BP 120/73 | HR 66 | Resp 16 | Ht 65.25 in | Wt 166.0 lb

## 2013-06-22 DIAGNOSIS — G4710 Hypersomnia, unspecified: Secondary | ICD-10-CM

## 2013-06-22 DIAGNOSIS — R0902 Hypoxemia: Secondary | ICD-10-CM | POA: Insufficient documentation

## 2013-06-22 DIAGNOSIS — G4734 Idiopathic sleep related nonobstructive alveolar hypoventilation: Secondary | ICD-10-CM

## 2013-06-22 HISTORY — DX: Hypoxemia: R09.02

## 2013-06-22 NOTE — Addendum Note (Signed)
Addended by: Melvyn Novas on: 06/22/2013 12:47 PM   Modules accepted: Orders

## 2013-06-22 NOTE — Progress Notes (Signed)
[facility]  Provider:  Melvyn Levine, M D  Referring Provider: Elvina Sidle, MD Primary Care Physician:  Angelica Sidle, MD  Chief Complaint  Patient presents with  . Follow-up    HPI:  Angelica Levine is a 63 y.o. female  Is seen here as a revisit  from Angelica Levine for sleep apnea.   Chief Complaint  Patient presents with  . Follow-up   [facility]  Provider:  Melvyn Levine, M D  Referring Provider: Elvina Sidle, MD Primary Care Physician:  Angelica Sidle, MD  Chief Complaint  Patient presents with  . Follow-up    HPI:  Angelica Levine is a 62 y.o. female  Is seen here as a referral/ revisit  from Angelica Levine for evaluation of possible OSA.   Interval Time history: Angelica Levine was scheduled for a split-night polysomnography which became a regular polysomnography. The study was performed on 04-30-2013 and dorsi Epworth sleepiness score at certain points of accident injury or 2 pints of BMI was 28 and her neck circumference 15 inches. Remarkable was a prestudy blood pressure of 171/78 mm Hg. The patient needed a long time to fall asleep and was a break during the sleep study for prolonged time a total of 98 minutes. Her sleep was very fragmented the single longest period of uninterrupted sleep was 60 minutes long. Apnea H. I was only 1.3 her are a GI 1.7 therefore nor significant sleep apnea was found and needed not to be treated. She had no periodic limb movements and she had fewer spontaneous arousals her heart rate remained in normal sinus rhythm with found some evidence of hypoxemia. Her lowest oxygen level was 85% the patient spent all for 2 hours in desaturation. Again CO2 retention was not associated with this.  The study results support the clinical report of hypoxemia and insomnia the degree of sleep fragmentation in the degree of hypoxemia may be causative related. PLAN :   I would consider a trial of oxygen at home  to see affect on sleep.  I also think that it is necessary to refer her to a sleep specialist psychologist to treat the insomnia and to find a feedback mechanism altercation mechanism in avoidance of prescribing sleep aids for indefinite time period . She did use  lorazepam that night and had Astelin and Flonase nasal spray available.   I would like to add that they were no narcotic pain medications taken that could account for a suppression of respiratory activity.      Last visit : Angelica Levine is here after her cardiologist commented on her ankle edema and her variable heart rate and since last June , with variable hypertension.  She had suffered from a viral infect and developed sinusitis in January, developed severe trouble breathing at night. Her sleep has been fragmented, and she choked some nights, woke up with palpitations.  She believed that she   screamed in sleep , around March 2014 -  Always when she had phlegm in her throat -This alarmed Dr Angelica Levine.   Angelica Levine's husband has also noted that she seems to produce choking sounds at night, She denies having vivid dreams or nightmares dreams, she has worsened ankle and lower leg swelling the summer.  And she had some episodes of postnasal drainage that seemed to have problems with choking episodes at night.  She never had a sleep study so far but she noticed a dry mouth in the morning and again there is a long  history of snoring . She is sleeping well with Ativan, but Dr. Faustino Levine has provided, for 3 hours with one half tab.    Patient reports that around 10 PM she will feel, that she needs to go to bed,  This is  early in comparison to her sleep initiation time in the last year . She takes clonidine since June 28th, generic form of Angelica Levine 0.1 mg tablets.  Initially she was very sedated on this medication and was want by Dr. Faustino Levine not to drive. He took her of Toprol XR and gave her bid Dose of Angelica Levine.   By now she feels  stable enough to operate machinery when taking it be tired enough. The patient reports sleeping for about 3 hours en bloc before waking up, mostly by palpitations.  It is about time that she noticed a postnasal drainage. She uses Flonase now in the evening score at nighttime which has not made a difference.  She has often only 3-4 hours of sleep all night.  The patient will rise around 7AM , often already awake for hours, and takes her morning medication. She does not need an alarm.  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 The patient takes daily of 15-30 minute nap, a short power nap.  Epworth sleepiness score and was at 10 pints fatigue severity score at 31 points depression score at 2 points. Medication list reviewed. Patient is referred for a renal artery ultrasound to rule out renal artery stenosis. The patient retired in 2000 and after a total knee replacement, reportedly sleeping well.  The current sleep difficulties seem all to have arisen with the variable blood pressures and palpitations beginning last June.  Review of Systems: Out of a complete 14 system review, the patient complains of only the following symptoms, and all other reviewed systems are negative. Fatigue, sleepiness, HTN, i.e. redness, observed snoring, leg swelling, stiffness of the neck access the first several foot allergies.  Epworth 13, FSS 44   History   Social History  . Marital Status: Married    Spouse Name: Angelica Levine    Number of Children: 2  . Years of Education: 7   Occupational History  .      retired   Social History Main Topics  . Smoking status: Never Smoker   . Smokeless tobacco: Never Used  . Alcohol Use: No     Comment: wine once in a while  . Drug Use: No  . Sexual Activity: Yes    Partners: Male   Other Topics Concern  . Not on file   Social History Narrative   Married. Education: [employer].    Exercise: 2 times a week; walk and gardening    Caffeine use: none   Patient is right handed             Family History  Problem Relation Age of Onset  . Hyperlipidemia Mother   . Hypertension Mother   . Heart disease Father   . Diabetes Father   . Hyperlipidemia Sister   . High Cholesterol Sister   . Hypertension Maternal Grandmother   . Diabetes Maternal Grandmother   . Heart disease Maternal Grandmother   . Diabetes Paternal Grandmother   . Heart disease Paternal Grandmother   . Cancer - Lung Paternal Grandmother   . Heart disease Paternal Grandfather   . Alzheimer's disease Maternal Grandfather     Past Medical History  Diagnosis Date  . Hypertension   .  Thyroid disease   . Allergy   . Arthritis   . High cholesterol     diet control  . Broken hip 1985  . Sleep apnea with hypersomnolence 04/13/2013  . Rosacea conjunctivitis 04/13/2013    Past Surgical History  Procedure Laterality Date  . Total knee arthroplasty Bilateral     left -2009,right-2003  . Breast surgery Left 1987  . Joint replacement    . Tubal ligation  1984  . Tonsillectomy  1956    Current Outpatient Prescriptions  Medication Sig Dispense Refill  . amLODipine (NORVASC) 5 MG tablet TAKE 1 TABLET BY MOUTH TWICE DAILY  60 tablet  0  . Azelaic Acid (FINACEA EX) Apply 1 application topically 2 (two) times daily.       Marland Kitchen azelastine (ASTELIN) 137 MCG/SPRAY nasal spray Place 1 spray into both nostrils 2 (two) times daily. Use in each nostril as directed  30 mL  11  . cloNIDine (Angelica Levine) 0.1 MG tablet Take 1 tablet (0.1 mg total) by mouth every 12 (twelve) hours.  180 tablet  0  . fluticasone (FLONASE) 50 MCG/ACT nasal spray Place 1 spray into the nose daily. One spray in each nostril.  16 g  7  . levothyroxine (SYNTHROID, LEVOTHROID) 50 MCG tablet Take 1 tablet (50 mcg total) by mouth daily.  90 tablet  1  . LORazepam (ATIVAN) 0.5 MG tablet Take 1 tablet (0.5 mg total) by mouth at bedtime as needed for anxiety.  30 tablet  3   No current  facility-administered medications for this visit.    Allergies as of 06/22/2013 - Review Complete 06/22/2013  Allergen Reaction Noted  . Losartan Shortness Of Breath 02/15/2012  . Shrimp [shellfish allergy] Anaphylaxis 11/19/2011  . Asa [aspirin] Diarrhea 11/19/2011  . Eggs or egg-derived products Other (See Comments) 01/09/2012  . Influenza vac split [flu virus vaccine]  05/07/2013  . Lisinopril Cough 02/14/2012  . Penicillins Hives 11/19/2011  . Sudafed [pseudoephedrine hcl] Hypertension 11/19/2011    Vitals: BP 120/73  Pulse 66  Resp 16  Ht 5' 5.25" (1.657 m)  Wt 166 lb (75.297 kg)  BMI 27.42 kg/m2 Last Weight:  Wt Readings from Last 1 Encounters:  06/22/13 166 lb (75.297 kg)   Last Height:   Ht Readings from Last 1 Encounters:  06/22/13 5' 5.25" (1.657 m)    Physical exam:  General: The patient is awake, alert and appears not in acute distress. The patient is well groomed. Head: Normocephalic, atraumatic. Neck is supple. Mallampati 3, neck circumference: 15 inches, no nasal deviation.  Cardiovascular:  Regular rate and rhythm , without  murmurs or carotid bruit, and without distended neck veins. Respiratory: Lungs are clear to auscultation. Skin:  Without evidence of edema, or rash. Facial skin with coarse , pale appearance, she has ciliary injection into both eyes, dry eyes.  Trunk: BMI is elevated . The patient  has normal posture.  Neurologic exam : The patient is awake and alert, oriented to place and time.   Memory subjective described as intact. There is a normal attention span & concentration ability.  Speech is fluent without dysarthria, dysphonia or aphasia. Mood and affect are appropriate.  Cranial nerves: Pupils are equal and briskly reactive to light. Funduscopic exam without  evidence of pallor or edema.  Hearing to finger rub intact.  Facial sensation intact to fine touch. Facial motor strength is symmetric and tongue and uvula move midline.  Motor  exam: Normal tone and normal muscle bulk and  symmetric normal strength in all extremities.  Sensory:  Fine touch, pinprick and vibration were tested in all extremities. Proprioception is normal.  Coordination: Rapid alternating movements in the fingers/hands is tested and normal.   Gait and station: Patient walks without assistive device ,.unfragmented.  Deep tendon reflexes: in the  upper and lower extremities are symmetric and intact.   Achilles is attenuated.   Assessment:  After physical and neurologic examination, review of  pre-existing records, assessment is that of a patient with a 12-15 month history of developing  1)sleep problems. Nocturnal palpitations, insomnia but snoring and dry mouth - choking . Patient appears physically fatigued. explained by hypoxemia- see sleep study results.    2)HTN  on multiple medications. Possible renal artery stenosis ? 3) edema   Plan:   Will inquire how to get this patient on a oxygen trial, covered by insurance. She has prolonged hypoxia without sleep apnea.  Insomnia referral to  presbytarian counseling.    She is using compression stockings, helping with the edema.  She will wear a heart monitor for one day  . She has an appointment with the [facility] , HTN clinic Dr Shawnee Knapp @ [facility]  for a renal artery ultrasound.

## 2013-06-22 NOTE — Patient Instructions (Signed)
Hypoxemia Hypoxemia occurs when your blood does not contain enough oxygen. The body cannot work well when it does not have enough oxygen, because every part of your body needs oxygen. Oxygen travels to all parts of the body through your blood. Hypoxemia can develop suddenly or can come on slowly. CAUSES Some common causes of hypoxemia include:  Long-term (chronic) lung diseases, such as chronic obstructive pulmonary disease (COPD) or interstitial lung disease.  Disorders that affect breathing at night, such as sleep apnea.  Fluid buildup in your lungs (pulmonary edema).  Lung infection (pneumonia).  Lung or throat cancer.  Abnormal blood flow that bypasses the lungs (shunt).  Certain diseasesthat affect nerves or muscles.  A collapsed lung (pneumothorax).  A blood clot in the lungs (pulmonary embolus).  Certain types of heart disease.  Slow or shallow breathing (hypoventilation).  Certain medicines.  High altitudes.  Toxic chemicals and gases. SIGNS AND SYMPTOMS Not everyone who has hypoxemia will develop symptoms. If the hypoxemia developed quickly, you will likely have symptoms such as shortness of breath. If the hypoxemia came on slowly over months or years, you may not notice any symptoms. Symptoms can include:  Shortness of breath (dyspnea).  Bluish color of the skin, lips, or nail beds.  Breathing that is fast, noisy, or shallow.  A fast heartbeat.  Feeling tired or sleepy.  Being confused or feeling anxious. DIAGNOSIS To determine if you have hypoxemia, your health care provider may perform:  A physical exam.  Blood tests.  A pulse oximetry. A sensor will be put on your finger, toe, or earlobe to measure the percent of oxygen in your blood. TREATMENT You will likely be treated with oxygen therapy. Depending on the cause of your hypoxemia, you may need oxygen for a short time (weeks or months), or you may need it indefinitely. Your health care provider  may also recommend other therapies to treat the underlying cause of your hypoxemia. HOME CARE INSTRUCTIONS  Only take over-the-counter or prescription medicines as directed by your health care provider.  Follow oxygen safety measures if you are on oxygen therapy. These may include:  Always having a backup supply of oxygen.  Not allowing anyone to smoke around oxygen.  Handling the oxygen tanks carefully and as instructed.  If you smoke, quit. Stay away from people who smoke.  Follow up with your health care provider as directed. SEEK MEDICAL CARE IF:  You have any concerns about your oxygen therapy.  You still have trouble breathing.  You become short of breath when you exercise.  You are tired when you wake up.  You have a headache when you wake up. SEEK IMMEDIATE MEDICAL CARE IF:   Your breathing gets worse.  You have new shortness of breath with normal activity.  You have a bluish color of the skin, lips, or nail beds.  You have confusion or cloudy thinking.  You cough up dark mucus.  You have chest pain.  You have a fever. MAKE SURE YOU:  Understand these instructions.  Will watch your condition.  Will get help right away if you are not doing well or get worse. Document Released: 01/15/2011 Document Revised: 03/04/2013 Document Reviewed: 01/29/2013 [employer] Patient Information 2014 [employer].  

## 2013-06-24 ENCOUNTER — Ambulatory Visit
Admission: RE | Admit: 2013-06-24 | Discharge: 2013-06-24 | Disposition: A | Discharge status: Home / Self Care | Payer: BC Managed Care – PPO | Source: Ambulatory Visit

## 2013-06-24 DIAGNOSIS — Z1231 Encounter for screening mammogram for malignant neoplasm of breast: Secondary | ICD-10-CM

## 2013-07-07 ENCOUNTER — Telehealth: Payer: Self-pay | Admitting: *Deleted

## 2013-07-08 NOTE — Telephone Encounter (Signed)
Give her an appointment in January with NP and I will peek in.

## 2013-07-08 NOTE — Telephone Encounter (Signed)
Recommendation from Dr. Vickey Huger that patient should be on a trial of oxygen therapy. No one ever called emailed to get ins to pay for the oxygen. She finally heard from home health and they told her that if she does not get it within 30 days of sleep study then insurance will not pay. Can we look into this further. Also, patient thinks her cardiac medication is contributing to her inability to fall asleep. She has an upcoming appointment with cardiologist to look into this.   I provided patient with the name, addresses and phone number of psychologist that patient was referred to. She will contact them after her cardiology appointment and feedback from there.   Patient would like to come in before the date of her appointment that was cancelled by our office to assure she is getting the services that were recommended by Dr. Vickey Huger or to get back on track with that. I asked her to let me get back to her within the next two weeks so we can take a look at Dr. Oliva Bustard schedule and see how we need to shuffle patients.

## 2013-07-13 NOTE — Telephone Encounter (Signed)
I called and spoke with patient. She states that she will be seeing cardiologist tomorrow and may need surgery. She wants to hold off on scheduling and will call back.

## 2013-08-06 ENCOUNTER — Ambulatory Visit (INDEPENDENT_AMBULATORY_CARE_PROVIDER_SITE_OTHER): Payer: BC Managed Care – PPO | Admitting: Family Medicine

## 2013-08-06 ENCOUNTER — Encounter: Payer: Self-pay | Admitting: Family Medicine

## 2013-08-06 VITALS — BP 136/74 | HR 73 | Temp 97.8°F | Resp 16 | Ht 65.0 in | Wt 165.0 lb

## 2013-08-06 DIAGNOSIS — J309 Allergic rhinitis, unspecified: Secondary | ICD-10-CM

## 2013-08-06 DIAGNOSIS — G4700 Insomnia, unspecified: Secondary | ICD-10-CM

## 2013-08-06 DIAGNOSIS — I10 Essential (primary) hypertension: Secondary | ICD-10-CM

## 2013-08-06 LAB — CBC
HCT: 44.8 % (ref 36.0–46.0)
Hemoglobin: 15.2 g/dL — ABNORMAL HIGH (ref 12.0–15.0)
MCH: 29.1 pg (ref 26.0–34.0)
MCHC: 33.9 g/dL (ref 30.0–36.0)
MCV: 85.8 fL (ref 78.0–100.0)
Platelets: 272 10*3/uL (ref 150–400)
RBC: 5.22 MIL/uL — ABNORMAL HIGH (ref 3.87–5.11)
RDW: 13.7 % (ref 11.5–15.5)
WBC: 6.9 10*3/uL (ref 4.0–10.5)

## 2013-08-06 LAB — COMPREHENSIVE METABOLIC PANEL
ALT: 28 U/L (ref 0–35)
AST: 24 U/L (ref 0–37)
Albumin: 4.5 g/dL (ref 3.5–5.2)
Alkaline Phosphatase: 93 U/L (ref 39–117)
BUN: 13 mg/dL (ref 6–23)
CO2: 28 mEq/L (ref 19–32)
Calcium: 9.3 mg/dL (ref 8.4–10.5)
Chloride: 101 mEq/L (ref 96–112)
Creat: 0.55 mg/dL (ref 0.50–1.10)
Glucose, Bld: 83 mg/dL (ref 70–99)
Potassium: 4 mEq/L (ref 3.5–5.3)
Sodium: 138 mEq/L (ref 135–145)
Total Bilirubin: 0.4 mg/dL (ref 0.3–1.2)
Total Protein: 7.3 g/dL (ref 6.0–8.3)

## 2013-08-06 LAB — POCT URINALYSIS DIPSTICK
Bilirubin, UA: NEGATIVE
Glucose, UA: NEGATIVE
Ketones, UA: NEGATIVE
Leukocytes, UA: NEGATIVE
Nitrite, UA: NEGATIVE
Protein, UA: NEGATIVE
Spec Grav, UA: 1.01
Urobilinogen, UA: 0.2
pH, UA: 7

## 2013-08-06 LAB — TSH: TSH: 2.628 u[IU]/mL (ref 0.350–4.500)

## 2013-08-06 MED ORDER — FLUTICASONE PROPIONATE 50 MCG/ACT NA SUSP: 1.0000 | Freq: Every day | NASAL | Status: DC

## 2013-08-06 MED ORDER — AMLODIPINE BESYLATE 5 MG PO TABS: 5.0000 mg | ORAL_TABLET | Freq: Every day | ORAL | Status: DC

## 2013-08-06 MED ORDER — LORAZEPAM 0.5 MG PO TABS: 0.5000 mg | ORAL_TABLET | Freq: Every evening | ORAL | Status: DC | PRN

## 2013-08-06 MED ORDER — CLONIDINE HCL 0.1 MG PO TABS: 0.1000 mg | ORAL_TABLET | Freq: Every day | ORAL | Status: DC

## 2013-08-06 MED ORDER — AZELASTINE HCL 0.1 % NA SOLN: 1.0000 | Freq: Two times a day (BID) | NASAL | Status: DC

## 2013-08-06 NOTE — Patient Instructions (Signed)
Hypertension As your heart beats, it forces blood through your arteries. This force is your blood pressure. If the pressure is too high, it is called hypertension (HTN) or high blood pressure. HTN is dangerous because you may have it and not know it. High blood pressure may mean that your heart has to work harder to pump blood. Your arteries may be narrow or stiff. The extra work puts you at risk for heart disease, stroke, and other problems.  Blood pressure consists of two numbers, a higher number over a lower, 110/72, for example. It is stated as "110 over 72." The ideal is below 120 for the top number (systolic) and under 80 for the bottom (diastolic). Write down your blood pressure today. You should pay close attention to your blood pressure if you have certain conditions such as:  Heart failure.  Prior heart attack.  Diabetes  Chronic kidney disease.  Prior stroke.  Multiple risk factors for heart disease. To see if you have HTN, your blood pressure should be measured while you are seated with your arm held at the level of the heart. It should be measured at least twice. A one-time elevated blood pressure reading (especially in the Emergency Department) does not mean that you need treatment. There may be conditions in which the blood pressure is different between your right and left arms. It is important to see your caregiver soon for a recheck. Most people have essential hypertension which means that there is not a specific cause. This type of high blood pressure may be lowered by changing lifestyle factors such as:  Stress.  Smoking.  Lack of exercise.  Excessive weight.  Drug/tobacco/alcohol use.  Eating less salt. Most people do not have symptoms from high blood pressure until it has caused damage to the body. Effective treatment can often prevent, delay or reduce that damage. TREATMENT  When a cause has been identified, treatment for high blood pressure is directed at the  cause. There are a large number of medications to treat HTN. These fall into several categories, and your caregiver will help you select the medicines that are best for you. Medications may have side effects. You should review side effects with your caregiver. If your blood pressure stays high after you have made lifestyle changes or started on medicines,   Your medication(s) may need to be changed.  Other problems may need to be addressed.  Be certain you understand your prescriptions, and know how and when to take your medicine.  Be sure to follow up with your caregiver within the time frame advised (usually within two weeks) to have your blood pressure rechecked and to review your medications.  If you are taking more than one medicine to lower your blood pressure, make sure you know how and at what times they should be taken. Taking two medicines at the same time can result in blood pressure that is too low. SEEK IMMEDIATE MEDICAL CARE IF:  You develop a severe headache, blurred or changing vision, or confusion.  You have unusual weakness or numbness, or a faint feeling.  You have severe chest or abdominal pain, vomiting, or breathing problems. MAKE SURE YOU:   Understand these instructions.  Will watch your condition.  Will get help right away if you are not doing well or get worse. Document Released: 07/02/2005 Document Revised: 09/24/2011 Document Reviewed: 02/20/2008 [facility] Patient Information 2014 [place]. Insomnia Insomnia is frequent trouble falling and/or staying asleep. Insomnia can be a long term problem  or a short term problem. Both are common. Insomnia can be a short term problem when the wakefulness is related to a certain stress or worry. Long term insomnia is often related to ongoing stress during waking hours and/or poor sleeping habits. Overtime, sleep deprivation itself can make the problem worse. Every little thing feels more severe because you are  overtired and your ability to cope is decreased. CAUSES   Stress, anxiety, and depression.  Poor sleeping habits.  Distractions such as TV in the bedroom.  Naps close to bedtime.  Engaging in emotionally charged conversations before bed.  Technical reading before sleep.  Alcohol and other sedatives. They may make the problem worse. They can hurt normal sleep patterns and normal dream activity.  Stimulants such as caffeine for several hours prior to bedtime.  Pain syndromes and shortness of breath can cause insomnia.  Exercise late at night.  Changing time zones may cause sleeping problems (jet lag). It is sometimes helpful to have someone observe your sleeping patterns. They should look for periods of not breathing during the night (sleep apnea). They should also look to see how long those periods last. If you live alone or observers are uncertain, you can also be observed at a sleep clinic where your sleep patterns will be professionally monitored. Sleep apnea requires a checkup and treatment. Give your caregivers your medical history. Give your caregivers observations your family has made about your sleep.  SYMPTOMS   Not feeling rested in the morning.  Anxiety and restlessness at bedtime.  Difficulty falling and staying asleep. TREATMENT   Your caregiver may prescribe treatment for an underlying medical disorders. Your caregiver can give advice or help if you are using alcohol or other drugs for self-medication. Treatment of underlying problems will usually eliminate insomnia problems.  Medications can be prescribed for short time use. They are generally not recommended for lengthy use.  Over-the-counter sleep medicines are not recommended for lengthy use. They can be habit forming.  You can promote easier sleeping by making lifestyle changes such as:  Using relaxation techniques that help with breathing and reduce muscle tension.  Exercising earlier in the  day.  Changing your diet and the time of your last meal. No night time snacks.  Establish a regular time to go to bed.  Counseling can help with stressful problems and worry.  Soothing music and white noise may be helpful if there are background noises you cannot remove.  Stop tedious detailed work at least one hour before bedtime. HOME CARE INSTRUCTIONS   Keep a diary. Inform your caregiver about your progress. This includes any medication side effects. See your caregiver regularly. Take note of:  Times when you are asleep.  Times when you are awake during the night.  The quality of your sleep.  How you feel the next day. This information will help your caregiver care for you.  Get out of bed if you are still awake after 15 minutes. Read or do some quiet activity. Keep the lights down. Wait until you feel sleepy and go back to bed.  Keep regular sleeping and waking hours. Avoid naps.  Exercise regularly.  Avoid distractions at bedtime. Distractions include watching television or engaging in any intense or detailed activity like attempting to balance the household checkbook.  Develop a bedtime ritual. Keep a familiar routine of bathing, brushing your teeth, climbing into bed at the same time each night, listening to soothing music. Routines increase the success of falling to sleep  faster.  Use relaxation techniques. This can be using breathing and muscle tension release routines. It can also include visualizing peaceful scenes. You can also help control troubling or intruding thoughts by keeping your mind occupied with boring or repetitive thoughts like the old concept of counting sheep. You can make it more creative like imagining planting one beautiful flower after another in your backyard garden.  During your day, work to eliminate stress. When this is not possible use some of the previous suggestions to help reduce the anxiety that accompanies stressful situations. MAKE SURE  YOU:   Understand these instructions.  Will watch your condition.  Will get help right away if you are not doing well or get worse. Document Released: 06/29/2000 Document Revised: 09/24/2011 Document Reviewed: 07/30/2007 [facility] Patient Information 2014 Angelica Levine.

## 2013-08-06 NOTE — Progress Notes (Signed)
Subjective:  This chart was scribed for [name], MD by Donato Schultz, Medical Scribe. This patient was seen in Room 24 and the patient's care was started at 11:25 AM.   Patient ID: Angelica Levine, female    DOB: 02-May-1951, 63 y.o.   MRN: [id number]  HPI HPI Comments: Angelica Levine is a 63 y.o. female with a history of Hypertension who presents to the [facility] for a follow-up appointment.  She states that she went to see Dr. Hartford Poli at [facility] the day before Thanksgiving and again on July 14, 2013.  She states that he has reduced her doses of the Clonidine and Lorazepam.  She states that her blood pressure has slightly improved.  She states that Dr. Hartford Poli gave her a blood pressure monitor at her last visit.  She states that she has since returned it.  She states that she did not have any blood work done and had a scan of her renal arteries which revealed normal results.  She states that she is still not sleeping well and think the Clonidine makes her sleepy but keeps her up.  She states that she will sleep for 3 hours at most.  She denies experiencing any new medical problems.    She states that she goes to the gym at least 3 times a week and has been going regularly since 1998.  She states that she does not have a Physiological scientist and follows her own workout regimen.    She states that she experienced some fluttering in her heart a couple of days ago when she woke up.  She states that her blood pressure was fine during the incident.    The patient states that she did not travel over the holidays due to the flu but she was able to see family.   She states that her husband is doing well but is currently recovering from flu-like symptoms.  She states that he does not have the flu.     The patient states that she needs a refill for all of her prescriptions.  She states that she is changing pharmacies.    Past Medical History  Diagnosis Date   Hypertension    Thyroid  disease    Allergy    Arthritis    High cholesterol     diet control   Broken hip 1985   Sleep apnea with hypersomnolence 04/13/2013   Rosacea conjunctivitis 04/13/2013   Hypoxemia requiring supplemental oxygen 06/22/2013   Past Surgical History  Procedure Laterality Date   Total knee arthroplasty Bilateral     left -2009,right-2003   Breast surgery Left 1987   Joint replacement     Tubal ligation  1984   Tonsillectomy  1956   Family History  Problem Relation Age of Onset   Hyperlipidemia Mother    Hypertension Mother    Heart disease Father    Diabetes Father    Hyperlipidemia Sister    High Cholesterol Sister    Hypertension Maternal Grandmother    Diabetes Maternal Grandmother    Heart disease Maternal Grandmother    Diabetes Paternal Grandmother    Heart disease Paternal Grandmother    Cancer - Lung Paternal Grandmother    Heart disease Paternal Grandfather    Alzheimer's disease Maternal Grandfather    History   Social History   Marital Status: Married    Spouse Name: Remo Lipps    Number of Children: 2   Years of Education: 7  Occupational History        retired   Social History Main Topics   Smoking status: Never Smoker    Smokeless tobacco: Never Used   Alcohol Use: No     Comment: wine once in a while   Drug Use: No   Sexual Activity: Yes    Partners: Male   Other Topics Concern   Not on file   Social History Narrative   Married. Education: [employer].    Exercise: 2 times a week; walk and gardening   Caffeine use: none   Patient is right handed            Allergies  Allergen Reactions   Losartan Shortness Of Breath   Shrimp [Shellfish Allergy] Anaphylaxis   Asa [Aspirin] Diarrhea    Stabbing pain in intestines   Eggs Or Egg-Derived Products Other (See Comments)    Reaction unknown   Influenza Vac Split [Flu Virus Vaccine]    Lisinopril Cough   Penicillins Hives   Sudafed [Pseudoephedrine Hcl]  Hypertension     Review of Systems  All other systems reviewed and are negative.     Objective:  Physical Exam  Nursing note and vitals reviewed. Constitutional: She is oriented to person, place, and time. She appears well-developed and well-nourished. No distress.  HENT:  Head: Normocephalic and atraumatic.  Right Ear: External ear normal.  Left Ear: External ear normal.  Mouth/Throat: Oropharynx is clear and moist. No oropharyngeal exudate.  Eyes: EOM are normal.  Neck: Normal range of motion. Neck supple. No tracheal deviation present. No thyromegaly present.  Cardiovascular: Normal rate, regular rhythm, normal heart sounds and intact distal pulses.  Exam reveals no gallop and no friction rub.   No murmur heard. Pulmonary/Chest: Effort normal and breath sounds normal. No respiratory distress. She has no wheezes. She has no rales.  Abdominal: Soft. There is no tenderness.  Musculoskeletal: Normal range of motion.  Lymphadenopathy:    She has no cervical adenopathy.  Neurological: She is alert and oriented to person, place, and time. No cranial nerve deficit. Coordination normal.  Skin: Skin is warm and dry.  Psychiatric: She has a normal mood and affect. Her behavior is normal.     BP 136/74   Pulse 73   Temp(Src) 97.8 F (36.6 C)   Resp 16   Ht 5\' 5"  (1.651 m)   Wt 165 lb (74.844 kg)   BMI 27.46 kg/m2   SpO2 98% Assessment & Plan:  Insomnia - Plan: LORazepam (ATIVAN) 0.5 MG tablet  Allergic rhinitis - Plan: azelastine (ASTELIN) 137 MCG/SPRAY nasal spray, fluticasone (FLONASE) 50 MCG/ACT nasal spray  Hypertension - Plan: amLODipine (NORVASC) 5 MG tablet, cloNIDine (CATAPRES) 0.1 MG tablet, CBC, TSH, Comprehensive metabolic panel  Patient is much calmer today than I have seen her in past years. I think her physician in [place], Dr. Hartford Poli, is doing wonderful job getting her off of her blood pressure medicine.  Signed, [name], MD    I personally performed the  services described in this documentation, which was scribed in my presence. The recorded information has been reviewed and is accurate.

## 2013-08-13 ENCOUNTER — Encounter: Payer: Self-pay | Admitting: Family Medicine

## 2013-08-14 ENCOUNTER — Other Ambulatory Visit: Payer: Self-pay | Admitting: Family Medicine

## 2013-08-14 MED ORDER — AZELASTINE HCL 0.1 % NA SOLN: 1.0000 | Freq: Two times a day (BID) | NASAL | Status: DC

## 2013-08-14 MED ORDER — FLUTICASONE PROPIONATE 50 MCG/ACT NA SUSP: 1.0000 | Freq: Two times a day (BID) | NASAL | Status: DC

## 2013-08-14 MED ORDER — AMLODIPINE BESYLATE 5 MG PO TABS: 5.0000 mg | ORAL_TABLET | Freq: Every day | ORAL | Status: DC

## 2013-08-16 ENCOUNTER — Encounter: Payer: Self-pay | Admitting: Family Medicine

## 2013-08-17 ENCOUNTER — Other Ambulatory Visit: Payer: Self-pay | Admitting: Family Medicine

## 2013-08-17 DIAGNOSIS — I10 Essential (primary) hypertension: Secondary | ICD-10-CM

## 2013-08-17 MED ORDER — AMLODIPINE BESYLATE 5 MG PO TABS: 5.0000 mg | ORAL_TABLET | Freq: Two times a day (BID) | ORAL | Status: DC

## 2013-09-22 ENCOUNTER — Ambulatory Visit: Payer: BC Managed Care – PPO | Admitting: Neurology

## 2013-10-12 ENCOUNTER — Encounter: Payer: Self-pay | Admitting: Family Medicine

## 2013-10-12 ENCOUNTER — Telehealth: Payer: Self-pay

## 2013-10-12 MED ORDER — LEVOTHYROXINE SODIUM 50 MCG PO TABS: 50.0000 ug | ORAL_TABLET | Freq: Every day | ORAL | Status: DC

## 2013-10-12 NOTE — Telephone Encounter (Signed)
Can we RF for her

## 2013-10-12 NOTE — Telephone Encounter (Signed)
Patient notified via My Chart.  Meds ordered this encounter  Medications  . levothyroxine (SYNTHROID, LEVOTHROID) 50 MCG tablet    Sig: Take 1 tablet (50 mcg total) by mouth daily.    Dispense:  90 tablet    Refill:  1    Order Specific Question:  Supervising Provider    Answer:  DOOLITTLE, ROBERT P [[id number]]

## 2013-10-13 NOTE — Telephone Encounter (Signed)
Pt emailed [place] with her current medications. Updated her chart

## 2013-10-23 ENCOUNTER — Encounter: Payer: Self-pay | Admitting: Family Medicine

## 2013-10-23 DIAGNOSIS — I10 Essential (primary) hypertension: Secondary | ICD-10-CM

## 2013-10-23 NOTE — Addendum Note (Signed)
Addended by: Kem Boroughs D on: 10/23/2013 10:52 AM   Modules accepted: Orders

## 2013-11-05 ENCOUNTER — Ambulatory Visit: Payer: BC Managed Care – PPO | Admitting: Family Medicine

## 2013-12-10 ENCOUNTER — Other Ambulatory Visit: Payer: Self-pay | Admitting: Physician Assistant

## 2013-12-28 ENCOUNTER — Encounter: Payer: BC Managed Care – PPO | Admitting: Family Medicine

## 2014-01-31 ENCOUNTER — Ambulatory Visit (INDEPENDENT_AMBULATORY_CARE_PROVIDER_SITE_OTHER): Payer: BC Managed Care – PPO | Admitting: Emergency Medicine

## 2014-01-31 VITALS — BP 126/68 | HR 77 | Temp 98.1°F | Resp 20 | Ht 64.25 in | Wt 168.2 lb

## 2014-01-31 DIAGNOSIS — I10 Essential (primary) hypertension: Secondary | ICD-10-CM

## 2014-01-31 LAB — CBC WITH DIFFERENTIAL/PLATELET
BASOS PCT: 0 % (ref 0–1)
Basophils Absolute: 0 10*3/uL (ref 0.0–0.1)
EOS PCT: 1 % (ref 0–5)
Eosinophils Absolute: 0.1 10*3/uL (ref 0.0–0.7)
HCT: 41.3 % (ref 36.0–46.0)
Hemoglobin: 14.5 g/dL (ref 12.0–15.0)
LYMPHS ABS: 2.3 10*3/uL (ref 0.7–4.0)
Lymphocytes Relative: 27 % (ref 12–46)
MCH: 29.2 pg (ref 26.0–34.0)
MCHC: 35.1 g/dL (ref 30.0–36.0)
MCV: 83.3 fL (ref 78.0–100.0)
Monocytes Absolute: 0.8 10*3/uL (ref 0.1–1.0)
Monocytes Relative: 9 % (ref 3–12)
Neutro Abs: 5.4 10*3/uL (ref 1.7–7.7)
Neutrophils Relative %: 63 % (ref 43–77)
PLATELETS: 305 10*3/uL (ref 150–400)
RBC: 4.96 MIL/uL (ref 3.87–5.11)
RDW: 13.2 % (ref 11.5–15.5)
WBC: 8.5 10*3/uL (ref 4.0–10.5)

## 2014-01-31 LAB — BASIC METABOLIC PANEL
BUN: 9 mg/dL (ref 6–23)
CO2: 27 meq/L (ref 19–32)
CREATININE: 0.53 mg/dL (ref 0.50–1.10)
Calcium: 9.4 mg/dL (ref 8.4–10.5)
Chloride: 101 mEq/L (ref 96–112)
Glucose, Bld: 93 mg/dL (ref 70–99)
Potassium: 4.3 mEq/L (ref 3.5–5.3)
Sodium: 140 mEq/L (ref 135–145)

## 2014-01-31 NOTE — Patient Instructions (Signed)
Hypertension Hypertension, commonly called high blood pressure, is when the force of blood pumping through your arteries is too strong. Your arteries are the blood vessels that carry blood from your heart throughout your body. A blood pressure reading consists of a higher number over a lower number, such as 110/72. The higher number (systolic) is the pressure inside your arteries when your heart pumps. The lower number (diastolic) is the pressure inside your arteries when your heart relaxes. Ideally you want your blood pressure below 120/80. Hypertension forces your heart to work harder to pump blood. Your arteries may become narrow or stiff. Having hypertension puts you at risk for heart disease, stroke, and other problems.  RISK FACTORS Some risk factors for high blood pressure are controllable. Others are not.  Risk factors you cannot control include:   Race. You may be at higher risk if you are African American.  Age. Risk increases with age.  Gender. Men are at higher risk than women before age 45 years. After age 65, women are at higher risk than men. Risk factors you can control include:  Not getting enough exercise or physical activity.  Being overweight.  Getting too much fat, sugar, calories, or salt in your diet.  Drinking too much alcohol. SIGNS AND SYMPTOMS Hypertension does not usually cause signs or symptoms. Extremely high blood pressure (hypertensive crisis) may cause headache, anxiety, shortness of breath, and nosebleed. DIAGNOSIS  To check if you have hypertension, your health care provider will measure your blood pressure while you are seated, with your arm held at the level of your heart. It should be measured at least twice using the same arm. Certain conditions can cause a difference in blood pressure between your right and left arms. A blood pressure reading that is higher than normal on one occasion does not mean that you need treatment. If one blood pressure reading  is high, ask your health care provider about having it checked again. TREATMENT  Treating high blood pressure includes making lifestyle changes and possibly taking medication. Living a healthy lifestyle can help lower high blood pressure. You may need to change some of your habits. Lifestyle changes may include:  Following the DASH diet. This diet is high in fruits, vegetables, and whole grains. It is low in salt, red meat, and added sugars.  Getting at least 2 1/2 hours of brisk physical activity every week.  Losing weight if necessary.  Not smoking.  Limiting alcoholic beverages.  Learning ways to reduce stress. If lifestyle changes are not enough to get your blood pressure under control, your health care provider may prescribe medicine. You may need to take more than one. Work closely with your health care provider to understand the risks and benefits. HOME CARE INSTRUCTIONS  Have your blood pressure rechecked as directed by your health care provider.   Only take medicine as directed by your health care provider. Follow the directions carefully. Blood pressure medicines must be taken as prescribed. The medicine does not work as well when you skip doses. Skipping doses also puts you at risk for problems.   Do not smoke.   Monitor your blood pressure at home as directed by your health care provider. SEEK MEDICAL CARE IF:   You think you are having a reaction to medicines taken.  You have recurrent headaches or feel dizzy.  You have swelling in your ankles.  You have trouble with your vision. SEEK IMMEDIATE MEDICAL CARE IF:  You develop a severe headache or   confusion.  You have unusual weakness, numbness, or feel faint.  You have severe chest or abdominal pain.  You vomit repeatedly.  You have trouble breathing. MAKE SURE YOU:   Understand these instructions.  Will watch your condition.  Will get help right away if you are not doing well or get  worse. Document Released: 07/02/2005 Document Revised: 07/07/2013 Document Reviewed: 04/24/2013 ExitCare Patient Information 2015 [employer]. This information is not intended to replace advice given to you by your health care provider. Make sure you discuss any questions you have with your health care provider.  

## 2014-01-31 NOTE — Progress Notes (Signed)
[facility] [address], [place] [address] [phone]  Date:  01/31/2014   Name:  Angelica Levine   DOB:  Jul 23, 1950   MRN:  [id number]  PCP:  [name], MD    Chief Complaint: Abdominal Pain   History of Present Illness:  Angelica Levine is a 63 y.o. very pleasant female patient who presents with the following:  Patient has a current complaint of labile blood pressure and racing heart rates to 105.  She had an episode of 3 days of loose stools that has resolved with a normal bowel movement today  Has been drinking various liquids. She complains of abdominal pain with no dysuria, urgency or frequency.  Has a lot of gas.  Has changed her diet recently eliminating meat and eating more vegetables than usual.  Now has decreased appetite.  When asked about her abdominal discomfort, she describes being in severe pain but not having any facies of discomfort.   She carries a notebook with frequent notations of vital signs that she describes as abnormal but that are in fact within mostly normal range.    Patient Active Problem List   Diagnosis Date Noted  . Hypoxemia requiring supplemental oxygen 06/22/2013  . Nocturnal hypoxemia 06/22/2013  . Sleep apnea with hypersomnolence 04/13/2013  . Rosacea conjunctivitis 04/13/2013  . Rosacea 02/28/2012  . HTN (hypertension) 01/21/2012  . Hypothyroidism 11/19/2011  . Allergic rhinitis 11/19/2011  . DJD (degenerative joint disease) 11/19/2011  . Lipids abnormal 11/19/2011    Past Medical History  Diagnosis Date  . Hypertension   . Thyroid disease   . Allergy   . Arthritis   . High cholesterol     diet control  . Broken hip 1985  . Sleep apnea with hypersomnolence 04/13/2013  . Rosacea conjunctivitis 04/13/2013  . Hypoxemia requiring supplemental oxygen 06/22/2013    Past Surgical History  Procedure Laterality Date  . Total knee arthroplasty Bilateral     left -2009,right-2003  . Breast surgery Left 1987  .  Joint replacement    . Tubal ligation  1984  . Tonsillectomy  1956    History  Substance Use Topics  . Smoking status: Never Smoker   . Smokeless tobacco: Never Used  . Alcohol Use: No     Comment: wine once in a while    Family History  Problem Relation Age of Onset  . Hyperlipidemia Mother   . Hypertension Mother   . Heart disease Father   . Diabetes Father   . Hyperlipidemia Sister   . High Cholesterol Sister   . Hypertension Maternal Grandmother   . Diabetes Maternal Grandmother   . Heart disease Maternal Grandmother   . Diabetes Paternal Grandmother   . Heart disease Paternal Grandmother   . Cancer - Lung Paternal Grandmother   . Heart disease Paternal Grandfather   . Alzheimer's disease Maternal Grandfather     Allergies  Allergen Reactions  . Losartan Shortness Of Breath  . Shrimp [Shellfish Allergy] Anaphylaxis  . Asa [Aspirin] Diarrhea    Stabbing pain in intestines  . Eggs Or Egg-Derived Products Other (See Comments)    Reaction unknown  . Influenza Vac Split [Flu Virus Vaccine]   . Lisinopril Cough  . Penicillins Hives  . Sudafed [Pseudoephedrine Hcl] Hypertension    Medication list has been reviewed and updated.  Current Outpatient Prescriptions on File Prior to Visit  Medication Sig Dispense Refill  . amLODipine (NORVASC) 5 MG tablet Pt is taking 1/2  tablet every morning and 1/2 tablet every evening      . Azelaic Acid (FINACEA EX) Apply 1 application topically 2 (two) times daily.       . cloNIDine (CATAPRES) 0.1 MG tablet Take 1 tablet (0.1 mg total) by mouth at bedtime.  90 tablet  3  . levothyroxine (SYNTHROID, LEVOTHROID) 50 MCG tablet Take 1 tablet (50 mcg total) by mouth daily.  90 tablet  1  . LORazepam (ATIVAN) 0.5 MG tablet Take 1 tablet (0.5 mg total) by mouth at bedtime as needed for anxiety.  90 tablet  1   No current facility-administered medications on file prior to visit.    Review of Systems:  As per HPI, otherwise negative.     Physical Examination: Filed Vitals:   01/31/14 1404  BP: 126/68  Pulse: 77  Temp: 98.1 F (36.7 C)  Resp: 20   Filed Vitals:   01/31/14 1404  Height: 5' 4.25" (1.632 m)  Weight: 168 lb 3.2 oz (76.295 kg)   Body mass index is 28.65 kg/(m^2). Ideal Body Weight: Weight in (lb) to have BMI = 25: 146.5   GEN: WDWN, NAD, Non-toxic, Alert & Oriented x 3 HEENT: Atraumatic, Normocephalic.  Ears and Nose: No external deformity. EXTR: No clubbing/cyanosis/edema NEURO: Normal gait.  PSYCH: Normally interactive. Conversant. Not depressed or anxious appearing.  Calm demeanor.  Abdomen soft not tender, BS+  No masses or megaly  Assessment and Plan: Abdominal pain Hypertension Tachycardia Dehydration Excessive flatus  Signed,  Ellison Carwin, MD

## 2014-02-04 ENCOUNTER — Encounter: Payer: Self-pay | Admitting: Family Medicine

## 2014-02-04 ENCOUNTER — Ambulatory Visit (INDEPENDENT_AMBULATORY_CARE_PROVIDER_SITE_OTHER): Payer: BC Managed Care – PPO | Admitting: Family Medicine

## 2014-02-04 VITALS — BP 120/64 | HR 63 | Temp 97.5°F | Resp 16 | Ht 64.25 in | Wt 167.4 lb

## 2014-02-04 DIAGNOSIS — Z0000 Encounter for general adult medical examination without abnormal findings: Secondary | ICD-10-CM

## 2014-02-04 DIAGNOSIS — G4700 Insomnia, unspecified: Secondary | ICD-10-CM

## 2014-02-04 DIAGNOSIS — I10 Essential (primary) hypertension: Secondary | ICD-10-CM

## 2014-02-04 DIAGNOSIS — E039 Hypothyroidism, unspecified: Secondary | ICD-10-CM

## 2014-02-04 LAB — POCT URINALYSIS DIPSTICK
Bilirubin, UA: NEGATIVE
Blood, UA: NEGATIVE
Glucose, UA: NEGATIVE
Ketones, UA: NEGATIVE
Nitrite, UA: NEGATIVE
Protein, UA: NEGATIVE
Spec Grav, UA: 1.02
Urobilinogen, UA: 0.2
pH, UA: 6

## 2014-02-04 LAB — LIPID PANEL
Cholesterol: 215 mg/dL — ABNORMAL HIGH (ref 0–200)
HDL: 42 mg/dL (ref >39)
LDL Cholesterol: 140 mg/dL — ABNORMAL HIGH (ref 0–99)
Total CHOL/HDL Ratio: 5.1 Ratio
Triglycerides: 163 mg/dL — ABNORMAL HIGH (ref <150)
VLDL: 33 mg/dL (ref 0–40)

## 2014-02-04 LAB — CBC
HCT: 42.3 % (ref 36.0–46.0)
Hemoglobin: 14.6 g/dL (ref 12.0–15.0)
MCH: 29 pg (ref 26.0–34.0)
MCHC: 34.5 g/dL (ref 30.0–36.0)
MCV: 84.1 fL (ref 78.0–100.0)
Platelets: 325 10*3/uL (ref 150–400)
RBC: 5.03 MIL/uL (ref 3.87–5.11)
RDW: 13.3 % (ref 11.5–15.5)
WBC: 7.9 10*3/uL (ref 4.0–10.5)

## 2014-02-04 LAB — COMPREHENSIVE METABOLIC PANEL
ALT: 41 U/L — ABNORMAL HIGH (ref 0–35)
AST: 27 U/L (ref 0–37)
Albumin: 4.5 g/dL (ref 3.5–5.2)
Alkaline Phosphatase: 104 U/L (ref 39–117)
BUN: 12 mg/dL (ref 6–23)
CO2: 26 mEq/L (ref 19–32)
Calcium: 9.9 mg/dL (ref 8.4–10.5)
Chloride: 102 mEq/L (ref 96–112)
Creat: 0.63 mg/dL (ref 0.50–1.10)
Glucose, Bld: 96 mg/dL (ref 70–99)
Potassium: 4.2 mEq/L (ref 3.5–5.3)
Sodium: 140 mEq/L (ref 135–145)
Total Bilirubin: 0.4 mg/dL (ref 0.2–1.2)
Total Protein: 8 g/dL (ref 6.0–8.3)

## 2014-02-04 LAB — TSH: TSH: 1.854 u[IU]/mL (ref 0.350–4.500)

## 2014-02-04 MED ORDER — CLONIDINE HCL 0.1 MG PO TABS: 0.1000 mg | ORAL_TABLET | Freq: Every day | ORAL | Status: DC

## 2014-02-04 MED ORDER — LORAZEPAM 0.5 MG PO TABS: 0.5000 mg | ORAL_TABLET | Freq: Every evening | ORAL | Status: AC | PRN

## 2014-02-04 MED ORDER — LEVOTHYROXINE SODIUM 50 MCG PO TABS: 50.0000 ug | ORAL_TABLET | Freq: Every day | ORAL | Status: AC

## 2014-02-04 MED ORDER — LORAZEPAM 0.5 MG PO TABS: 0.5000 mg | ORAL_TABLET | Freq: Every evening | ORAL | Status: DC | PRN

## 2014-02-04 MED ORDER — AMLODIPINE BESYLATE 2.5 MG PO TABS: 2.5000 mg | ORAL_TABLET | Freq: Two times a day (BID) | ORAL | Status: DC

## 2014-02-04 NOTE — Patient Instructions (Signed)

## 2014-02-04 NOTE — Progress Notes (Signed)
This chart was scribed for Angelica Haber, MD by Elby Beck, Scribe. This patient was seen in room 27and the patient's care was started at 10:01 AM.  @UMFCLOGO @  Patient ID: Angelica Levine MRN: [id number], DOB: [birth year], 63 y.o. Date of Encounter: 02/04/2014, 10:15 AM  Primary Physician: Angelica Haber, MD  Chief Complaint: Complete Physical Examination  HPI: 63 y.o. year old female with history below presents for a complete physical examination. She states that she just recently got over what she believes to have been a virus, with a primary symptom of 2 days of excessive flatulence. She is no longer having this symptom. She states that she believes she has partial hearing loss in her left ear. She states that she had mastoiditis in the left ear as a child. She states that she had a normal colonoscopy in May 2014. She states that she is due for her next mammogram in December 2015. She states that she last had a Tdap booster in 2011. She had a Shingles vaccination in 2010. She states that she has never had a pneumonia vaccination. She lives in [place], [place]. She has horses, chickens, [place] pigs and a [place]. She has also been growing corn and other crops. She is also requesting medication refills today.   Past Medical History  Diagnosis Date   Hypertension    Thyroid disease    Allergy    Arthritis    High cholesterol     diet control   Broken hip 1985   Sleep apnea with hypersomnolence 04/13/2013   Rosacea conjunctivitis 04/13/2013   Hypoxemia requiring supplemental oxygen 06/22/2013     Home Meds: Prior to Admission medications   Medication Sig Start Date End Date Taking? Authorizing Provider  amLODipine (NORVASC) 5 MG tablet Pt is taking 1/2 tablet every morning and 1/2 tablet every evening    Historical Provider, MD  Azelaic Acid (FINACEA EX) Apply 1 application topically 2 (two) times daily.     Historical Provider, MD  cetirizine (ZYRTEC) 10 MG tablet Take  10 mg by mouth daily.    Historical Provider, MD  cloNIDine (CATAPRES) 0.1 MG tablet Take 1 tablet (0.1 mg total) by mouth at bedtime. 08/06/13   Angelica Haber, MD  levothyroxine (SYNTHROID, LEVOTHROID) 50 MCG tablet Take 1 tablet (50 mcg total) by mouth daily. 10/12/13   Chelle S Jeffery, PA-C  LORazepam (ATIVAN) 0.5 MG tablet Take 1 tablet (0.5 mg total) by mouth at bedtime as needed for anxiety. 08/06/13   Angelica Haber, MD  sodium chloride (OCEAN) 0.65 % SOLN nasal spray Place 1 spray into both nostrils 4 (four) times daily as needed for congestion.    Historical Provider, MD    Allergies:  Allergies  Allergen Reactions   Losartan Shortness Of Breath   Shrimp [Shellfish Allergy] Anaphylaxis   Asa [Aspirin] Diarrhea    Stabbing pain in intestines   Eggs Or Egg-Derived Products Other (See Comments)    Reaction unknown   Influenza Vac Split [Flu Virus Vaccine]    Lisinopril Cough   Penicillins Hives   Sudafed [Pseudoephedrine Hcl] Hypertension    History   Social History   Marital Status: Married    Spouse Name: Remo Lipps    Number of Children: 2   Years of Education: 7   Occupational History        retired   Social History Main Topics   Smoking status: Never Smoker    Smokeless tobacco: Never Used   Alcohol Use: No  Comment: wine once in a while   Drug Use: No   Sexual Activity: Yes    Partners: Male   Other Topics Concern   Not on file   Social History Narrative   Married. Education: [employer].    Exercise: 2 times a week; walk and gardening   Caffeine use: none   Patient is right handed              Review of Systems: Constitutional: negative for chills, fever, night sweats, weight changes, or fatigue  HEENT: negative for vision changes, hearing loss, congestion, rhinorrhea, ST, epistaxis, or sinus pressure Cardiovascular: negative for chest pain or palpitations Respiratory: negative for hemoptysis, wheezing, shortness of breath, or  cough Abdominal: negative for abdominal pain, nausea, vomiting, diarrhea, or constipation Dermatological: negative for rash Neurologic: negative for headache, dizziness, or syncope All other systems reviewed and are otherwise negative with the exception to those above and in the HPI.   Physical Exam: Blood pressure 120/64, pulse 63, temperature 97.5 F (36.4 C), temperature source Oral, resp. rate 16, height 5' 4.25" (1.632 m), weight 167 lb 6.4 oz (75.932 kg), SpO2 97.00%., Body mass index is 28.51 kg/(m^2). General: Well developed, well nourished, in no acute distress. Head: Normocephalic, atraumatic, eyes without discharge, sclera non-icteric, nares are without discharge. Bilateral auditory canals clear, TM's are without perforation, pearly grey and translucent with reflective cone of light bilaterally. Oral cavity moist, posterior pharynx without exudate, erythema, peritonsillar abscess, or post nasal drip.  Neck: Supple. No thyromegaly. Full ROM. No lymphadenopathy. Lungs: Clear bilaterally to auscultation without wheezes, rales, or rhonchi. Breathing is unlabored. Heart: RRR with S1 S2. No murmurs, rubs, or gallops appreciated. Abdomen: Soft, non-tender, non-distended with normoactive bowel sounds. No hepatomegaly. No rebound/guarding. No obvious abdominal masses. Msk:  Strength and tone normal for age. Extremities/Skin: Warm and dry. No clubbing or cyanosis. No edema. No rashes or suspicious lesions. Neuro: Alert and oriented X 3. Moves all extremities spontaneously. Gait is normal. CNII-XII grossly in tact. Psych:  Responds to questions appropriately with a normal affect.   Labs: Results for orders placed in visit on 02/04/14  POCT URINALYSIS DIPSTICK      Result Value Ref Range   Color, UA yellow     Clarity, UA clear     Glucose, UA neg     Bilirubin, UA neg     Ketones, UA neg     Spec Grav, UA 1.020     Blood, UA neg     pH, UA 6.0     Protein, UA neg     Urobilinogen,  UA 0.2     Nitrite, UA neg     Leukocytes, UA Trace        ASSESSMENT AND PLAN:  63 y.o. year old female with Annual physical exam - Plan: POCT urinalysis dipstick, CBC, Comprehensive metabolic panel, Lipid panel, TSH, Vit D  25 hydroxy (rtn osteoporosis monitoring)  Essential hypertension - Plan: amLODipine (NORVASC) 2.5 MG tablet, cloNIDine (CATAPRES) 0.1 MG tablet  Insomnia - Plan: LORazepam (ATIVAN) 0.5 MG tablet, DISCONTINUED: LORazepam (ATIVAN) 0.5 MG tablet  Unspecified hypothyroidism - Plan: levothyroxine (SYNTHROID, LEVOTHROID) 50 MCG tablet     Signed, Angelica Haber, MD 02/04/2014 10:15 AM    I personally performed the services described in this documentation, which was scribed in my presence. The recorded information has been reviewed and is accurate.

## 2014-02-05 LAB — VITAMIN D 25 HYDROXY (VIT D DEFICIENCY, FRACTURES): Vit D, 25-Hydroxy: 39 ng/mL (ref 30–89)

## 2014-03-12 ENCOUNTER — Ambulatory Visit
Admission: RE | Admit: 2014-03-12 | Discharge: 2014-03-12 | Disposition: A | Discharge status: Home / Self Care | Payer: BC Managed Care – PPO | Source: Ambulatory Visit | Attending: Family Medicine | Admitting: Family Medicine

## 2014-03-12 ENCOUNTER — Other Ambulatory Visit: Payer: Self-pay | Admitting: Family Medicine

## 2014-03-12 DIAGNOSIS — M545 Low back pain, unspecified: Secondary | ICD-10-CM

## 2014-03-12 DIAGNOSIS — M898X8 Other specified disorders of bone, other site: Secondary | ICD-10-CM

## 2014-04-29 ENCOUNTER — Ambulatory Visit: Payer: BC Managed Care – PPO | Admitting: Family Medicine

## 2014-05-25 ENCOUNTER — Other Ambulatory Visit: Payer: Self-pay

## 2014-05-25 DIAGNOSIS — Z1231 Encounter for screening mammogram for malignant neoplasm of breast: Secondary | ICD-10-CM

## 2014-07-01 ENCOUNTER — Ambulatory Visit
Admission: RE | Admit: 2014-07-01 | Discharge: 2014-07-01 | Disposition: A | Discharge status: Home / Self Care | Payer: Medicare Other | Source: Ambulatory Visit

## 2014-07-01 DIAGNOSIS — Z1231 Encounter for screening mammogram for malignant neoplasm of breast: Secondary | ICD-10-CM

## 2014-07-26 DIAGNOSIS — I10 Essential (primary) hypertension: Secondary | ICD-10-CM | POA: Diagnosis not present

## 2014-08-26 DIAGNOSIS — I10 Essential (primary) hypertension: Secondary | ICD-10-CM | POA: Diagnosis not present

## 2014-09-06 DIAGNOSIS — E780 Pure hypercholesterolemia: Secondary | ICD-10-CM | POA: Diagnosis not present

## 2014-09-10 DIAGNOSIS — E780 Pure hypercholesterolemia: Secondary | ICD-10-CM | POA: Diagnosis not present

## 2014-09-10 DIAGNOSIS — F5101 Primary insomnia: Secondary | ICD-10-CM | POA: Diagnosis not present

## 2014-09-10 DIAGNOSIS — I10 Essential (primary) hypertension: Secondary | ICD-10-CM | POA: Diagnosis not present

## 2014-09-10 DIAGNOSIS — Z23 Encounter for immunization: Secondary | ICD-10-CM | POA: Diagnosis not present

## 2014-09-10 DIAGNOSIS — L719 Rosacea, unspecified: Secondary | ICD-10-CM | POA: Diagnosis not present

## 2014-09-27 DIAGNOSIS — L821 Other seborrheic keratosis: Secondary | ICD-10-CM | POA: Diagnosis not present

## 2014-09-27 DIAGNOSIS — D1801 Hemangioma of skin and subcutaneous tissue: Secondary | ICD-10-CM | POA: Diagnosis not present

## 2014-09-27 DIAGNOSIS — L814 Other melanin hyperpigmentation: Secondary | ICD-10-CM | POA: Diagnosis not present

## 2014-09-27 DIAGNOSIS — D225 Melanocytic nevi of trunk: Secondary | ICD-10-CM | POA: Diagnosis not present

## 2014-09-27 DIAGNOSIS — L718 Other rosacea: Secondary | ICD-10-CM | POA: Diagnosis not present

## 2014-10-20 DIAGNOSIS — M1712 Unilateral primary osteoarthritis, left knee: Secondary | ICD-10-CM | POA: Diagnosis not present

## 2014-11-05 IMAGING — CR DG LUMBAR SPINE COMPLETE 4+V
5 series · 5 of 5 positions shown · non-contrast
Comparison: .

CLINICAL DATA: Low back pain for 1 year

EXAM:
LUMBAR SPINE - COMPLETE 4+ VIEW

[view not recorded (1 of 5)]
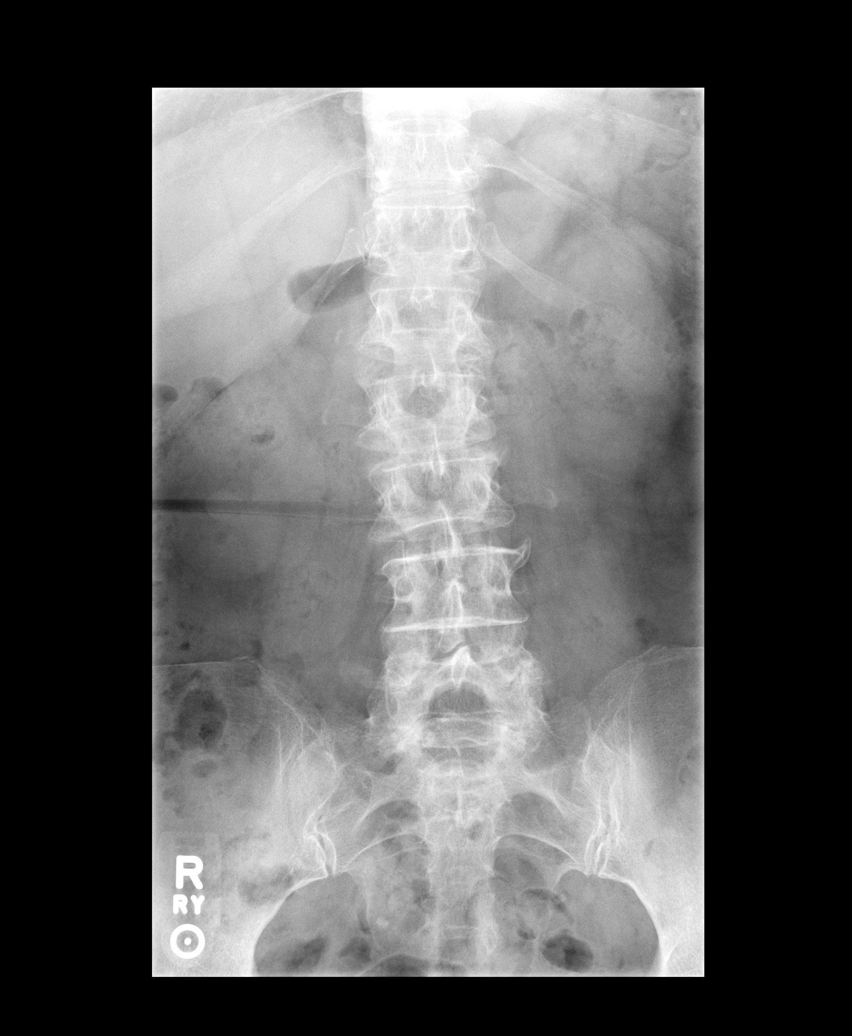

[view not recorded (2 of 5)]
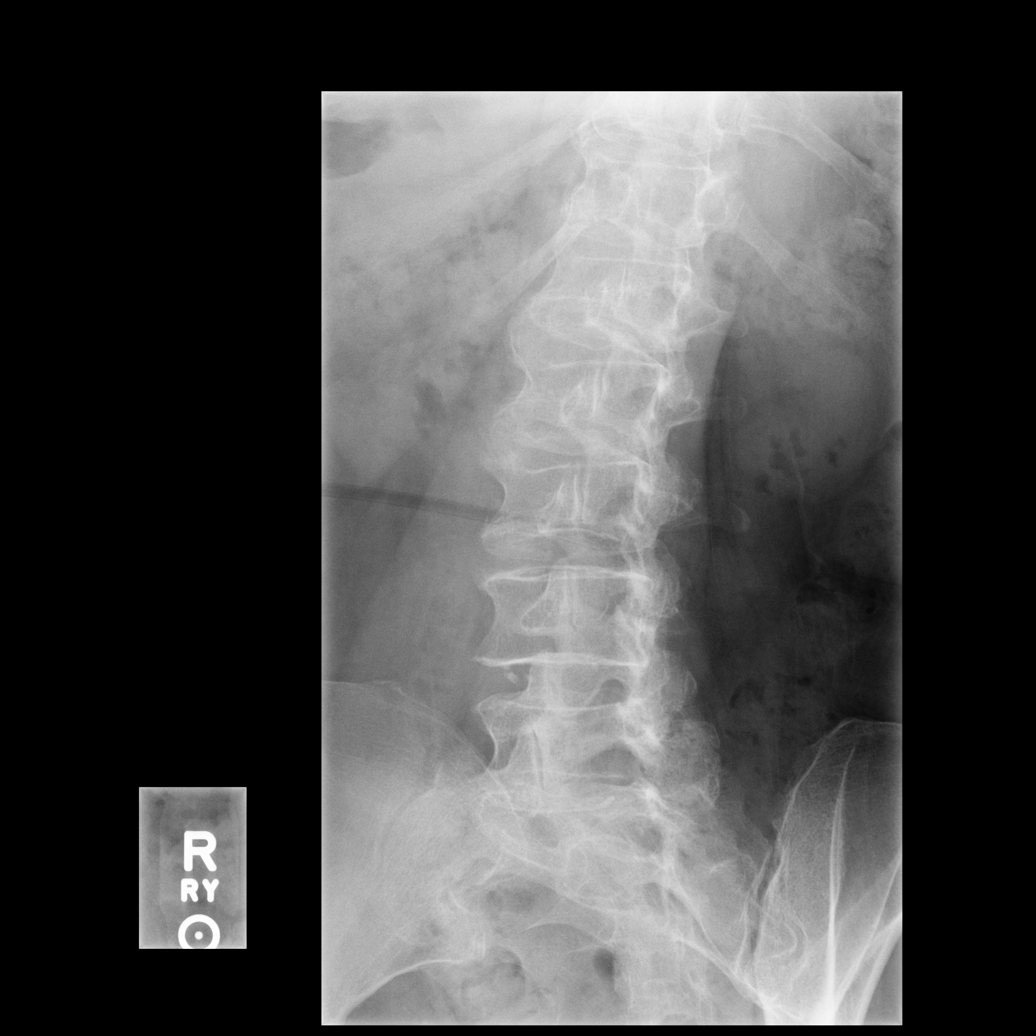

[view not recorded (3 of 5)]
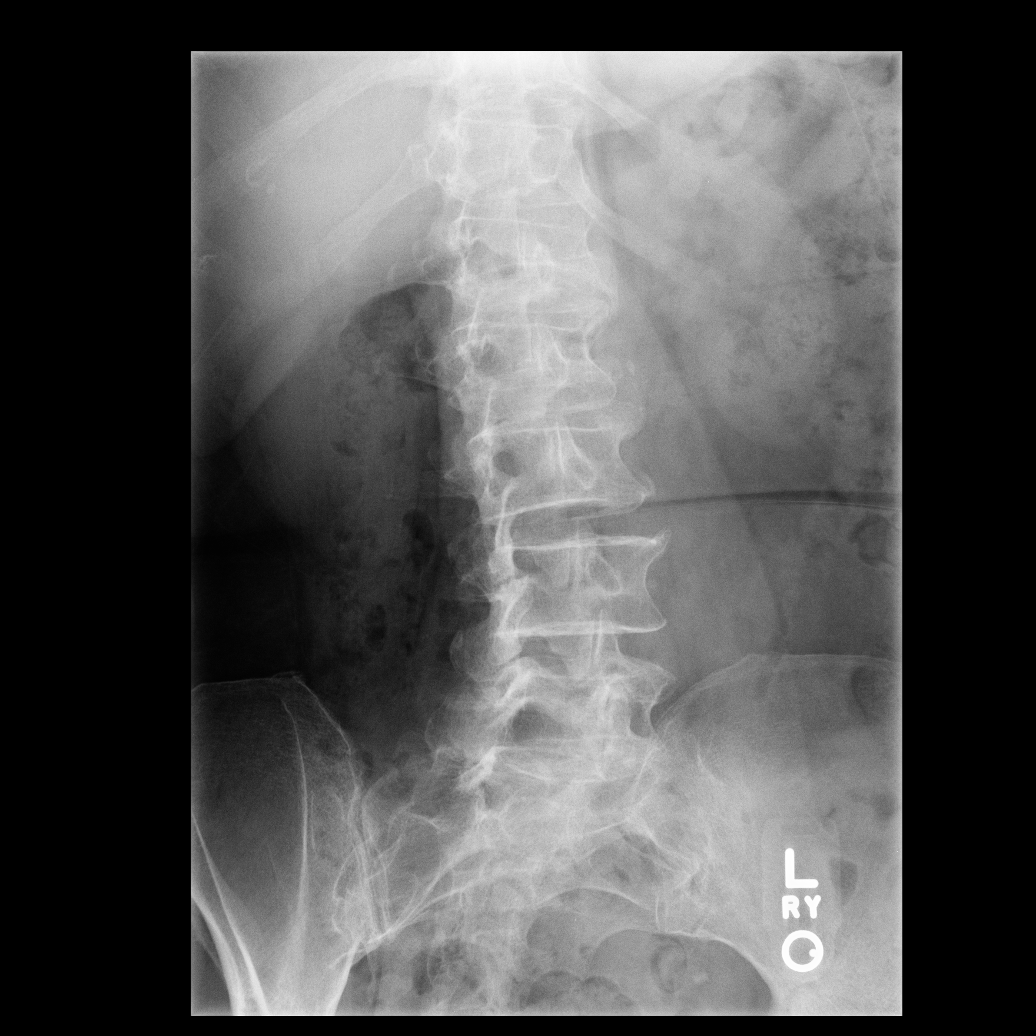

[view not recorded (4 of 5)]
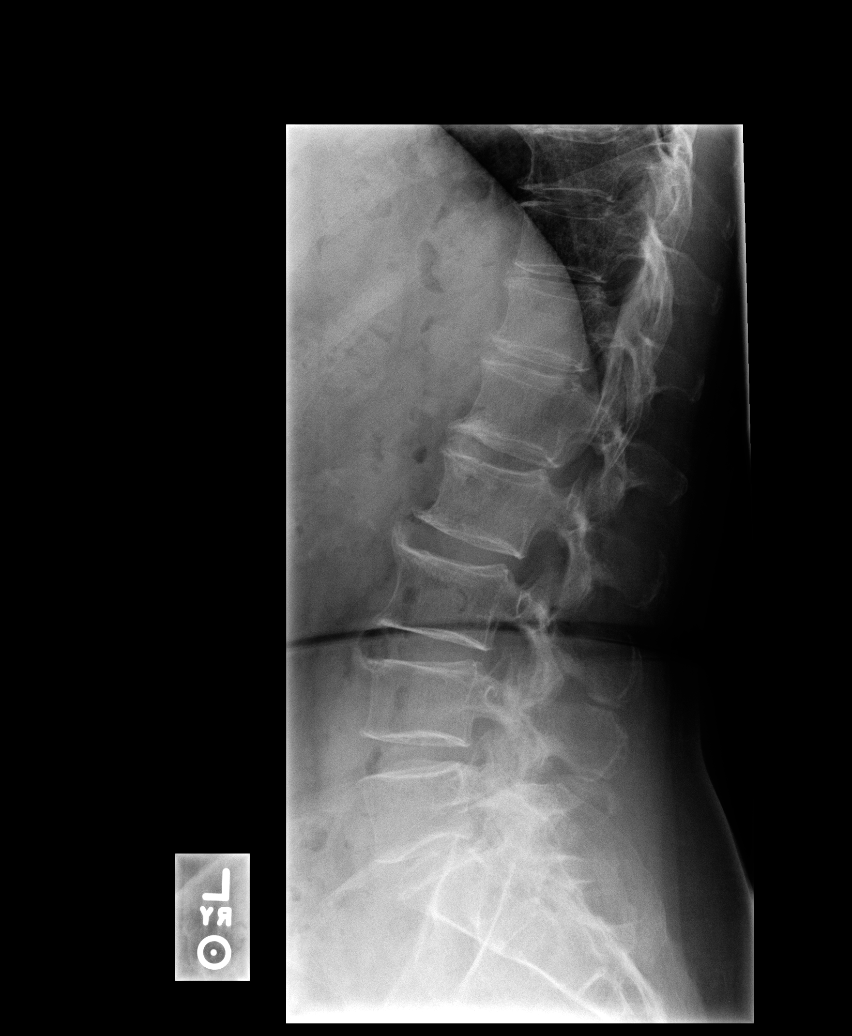

[view not recorded (5 of 5)]
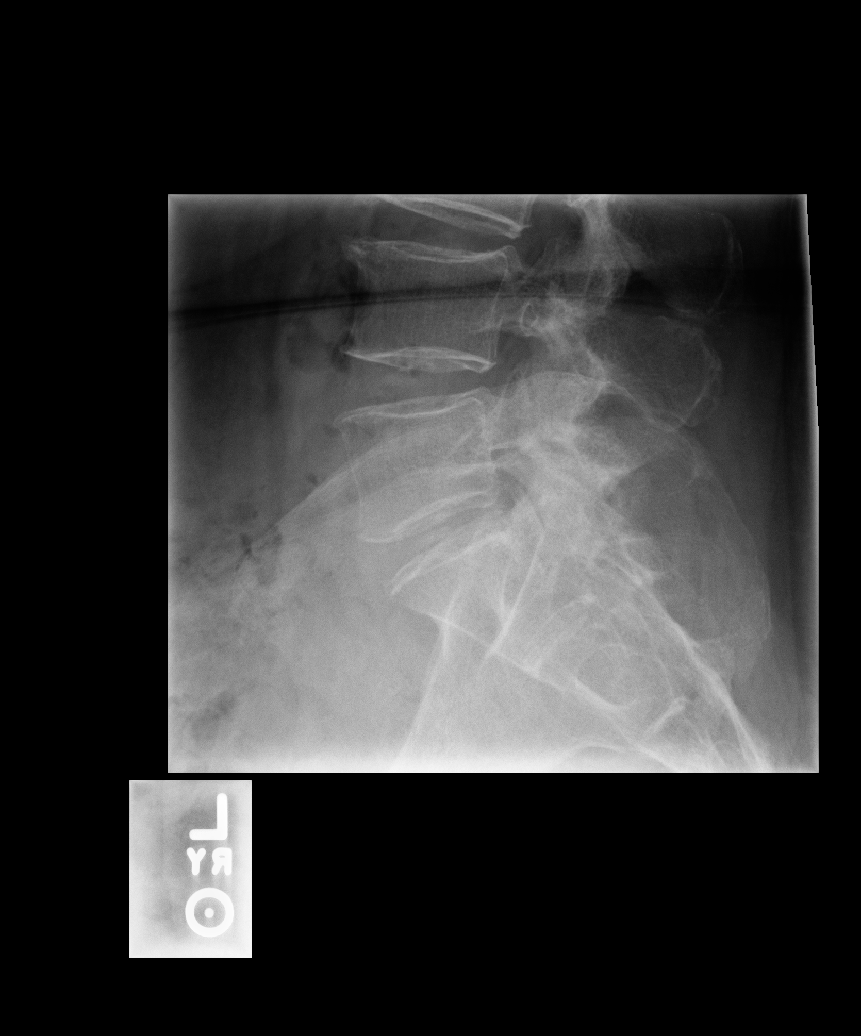

[5 of 5 positions shown; findings below may reference images not displayed]

FINDINGS: The lumbar vertebral bodies are preserved in height. The
intervertebral disc space heights are well maintained. There are
endplate osteophytes at L1-2, L2-3, and L3-4. There is facet joint
hypertrophy at L3-4 and at L4-5. The pedicles and transverse
processes are intact.
IMPRESSION: There are mild degenerative changes of the lumbar spine. There is no
compression fracture nor other acute bony abnormality.

## 2014-11-22 DIAGNOSIS — L821 Other seborrheic keratosis: Secondary | ICD-10-CM | POA: Diagnosis not present

## 2014-11-22 DIAGNOSIS — D485 Neoplasm of uncertain behavior of skin: Secondary | ICD-10-CM | POA: Diagnosis not present

## 2014-12-22 DIAGNOSIS — I10 Essential (primary) hypertension: Secondary | ICD-10-CM | POA: Diagnosis not present

## 2014-12-22 DIAGNOSIS — E780 Pure hypercholesterolemia: Secondary | ICD-10-CM | POA: Diagnosis not present

## 2014-12-27 DIAGNOSIS — S61211A Laceration without foreign body of left index finger without damage to nail, initial encounter: Secondary | ICD-10-CM | POA: Diagnosis not present

## 2014-12-27 DIAGNOSIS — S61213A Laceration without foreign body of left middle finger without damage to nail, initial encounter: Secondary | ICD-10-CM | POA: Diagnosis not present

## 2014-12-27 DIAGNOSIS — F17210 Nicotine dependence, cigarettes, uncomplicated: Secondary | ICD-10-CM | POA: Diagnosis not present

## 2014-12-28 DIAGNOSIS — R7302 Impaired glucose tolerance (oral): Secondary | ICD-10-CM | POA: Diagnosis not present

## 2014-12-28 DIAGNOSIS — I10 Essential (primary) hypertension: Secondary | ICD-10-CM | POA: Diagnosis not present

## 2014-12-28 DIAGNOSIS — L299 Pruritus, unspecified: Secondary | ICD-10-CM | POA: Diagnosis not present

## 2014-12-28 DIAGNOSIS — E780 Pure hypercholesterolemia: Secondary | ICD-10-CM | POA: Diagnosis not present

## 2015-01-03 DIAGNOSIS — M2011 Hallux valgus (acquired), right foot: Secondary | ICD-10-CM | POA: Diagnosis not present

## 2015-01-03 DIAGNOSIS — M2012 Hallux valgus (acquired), left foot: Secondary | ICD-10-CM | POA: Diagnosis not present

## 2015-01-03 DIAGNOSIS — L84 Corns and callosities: Secondary | ICD-10-CM | POA: Diagnosis not present

## 2015-01-10 ENCOUNTER — Other Ambulatory Visit: Payer: Self-pay

## 2015-01-12 ENCOUNTER — Ambulatory Visit: Payer: Medicare Other | Admitting: Podiatry

## 2015-01-13 DIAGNOSIS — I10 Essential (primary) hypertension: Secondary | ICD-10-CM | POA: Diagnosis not present

## 2015-01-20 DIAGNOSIS — L821 Other seborrheic keratosis: Secondary | ICD-10-CM | POA: Diagnosis not present

## 2015-03-11 DIAGNOSIS — R252 Cramp and spasm: Secondary | ICD-10-CM | POA: Diagnosis not present

## 2015-03-22 ENCOUNTER — Ambulatory Visit
Admission: RE | Admit: 2015-03-22 | Discharge: 2015-03-22 | Disposition: A | Discharge status: Home / Self Care | Payer: Medicare Other | Source: Ambulatory Visit | Attending: Family Medicine | Admitting: Family Medicine

## 2015-03-22 ENCOUNTER — Other Ambulatory Visit: Payer: Self-pay | Admitting: Family Medicine

## 2015-03-22 DIAGNOSIS — IMO0002 Reserved for concepts with insufficient information to code with codable children: Secondary | ICD-10-CM

## 2015-03-22 DIAGNOSIS — R52 Pain, unspecified: Secondary | ICD-10-CM

## 2015-03-22 DIAGNOSIS — R229 Localized swelling, mass and lump, unspecified: Secondary | ICD-10-CM

## 2015-03-22 DIAGNOSIS — M19072 Primary osteoarthritis, left ankle and foot: Secondary | ICD-10-CM | POA: Diagnosis not present

## 2015-03-22 DIAGNOSIS — S9032XA Contusion of left foot, initial encounter: Secondary | ICD-10-CM | POA: Diagnosis not present

## 2015-03-22 DIAGNOSIS — M67479 Ganglion, unspecified ankle and foot: Secondary | ICD-10-CM | POA: Diagnosis not present

## 2015-04-05 ENCOUNTER — Other Ambulatory Visit: Payer: Self-pay

## 2015-04-05 DIAGNOSIS — Z1231 Encounter for screening mammogram for malignant neoplasm of breast: Secondary | ICD-10-CM

## 2015-04-07 DIAGNOSIS — H1010 Acute atopic conjunctivitis, unspecified eye: Secondary | ICD-10-CM | POA: Insufficient documentation

## 2015-04-07 DIAGNOSIS — J309 Allergic rhinitis, unspecified: Principal | ICD-10-CM

## 2015-04-28 DIAGNOSIS — M8589 Other specified disorders of bone density and structure, multiple sites: Secondary | ICD-10-CM | POA: Diagnosis not present

## 2015-05-24 DIAGNOSIS — R7302 Impaired glucose tolerance (oral): Secondary | ICD-10-CM | POA: Diagnosis not present

## 2015-05-24 DIAGNOSIS — M8589 Other specified disorders of bone density and structure, multiple sites: Secondary | ICD-10-CM | POA: Diagnosis not present

## 2015-05-24 DIAGNOSIS — Z0000 Encounter for general adult medical examination without abnormal findings: Secondary | ICD-10-CM | POA: Diagnosis not present

## 2015-05-24 DIAGNOSIS — Z23 Encounter for immunization: Secondary | ICD-10-CM | POA: Diagnosis not present

## 2015-05-24 DIAGNOSIS — Z008 Encounter for other general examination: Secondary | ICD-10-CM | POA: Diagnosis not present

## 2015-05-24 DIAGNOSIS — I10 Essential (primary) hypertension: Secondary | ICD-10-CM | POA: Diagnosis not present

## 2015-06-21 DIAGNOSIS — Z23 Encounter for immunization: Secondary | ICD-10-CM | POA: Diagnosis not present

## 2015-07-04 ENCOUNTER — Ambulatory Visit
Admission: RE | Admit: 2015-07-04 | Discharge: 2015-07-04 | Disposition: A | Discharge status: Home / Self Care | Payer: Medicare Other | Source: Ambulatory Visit

## 2015-07-04 DIAGNOSIS — Z1231 Encounter for screening mammogram for malignant neoplasm of breast: Secondary | ICD-10-CM | POA: Diagnosis not present

## 2015-08-16 DIAGNOSIS — Z124 Encounter for screening for malignant neoplasm of cervix: Secondary | ICD-10-CM | POA: Diagnosis not present

## 2015-08-16 DIAGNOSIS — Z6826 Body mass index (BMI) 26.0-26.9, adult: Secondary | ICD-10-CM | POA: Diagnosis not present

## 2015-09-01 DIAGNOSIS — I10 Essential (primary) hypertension: Secondary | ICD-10-CM | POA: Diagnosis not present

## 2015-10-17 DIAGNOSIS — D1801 Hemangioma of skin and subcutaneous tissue: Secondary | ICD-10-CM | POA: Diagnosis not present

## 2015-10-17 DIAGNOSIS — L718 Other rosacea: Secondary | ICD-10-CM | POA: Diagnosis not present

## 2015-10-17 DIAGNOSIS — D225 Melanocytic nevi of trunk: Secondary | ICD-10-CM | POA: Diagnosis not present

## 2015-12-01 DIAGNOSIS — L718 Other rosacea: Secondary | ICD-10-CM | POA: Diagnosis not present

## 2015-12-01 DIAGNOSIS — L720 Epidermal cyst: Secondary | ICD-10-CM | POA: Diagnosis not present

## 2016-01-10 DIAGNOSIS — R300 Dysuria: Secondary | ICD-10-CM | POA: Diagnosis not present

## 2016-01-18 DIAGNOSIS — L817 Pigmented purpuric dermatosis: Secondary | ICD-10-CM | POA: Diagnosis not present

## 2016-01-24 DIAGNOSIS — M1712 Unilateral primary osteoarthritis, left knee: Secondary | ICD-10-CM | POA: Diagnosis not present

## 2016-06-01 ENCOUNTER — Other Ambulatory Visit: Payer: Self-pay | Admitting: Obstetrics and Gynecology

## 2016-06-01 ENCOUNTER — Other Ambulatory Visit: Payer: Self-pay | Admitting: Internal Medicine

## 2016-06-01 DIAGNOSIS — Z1231 Encounter for screening mammogram for malignant neoplasm of breast: Secondary | ICD-10-CM

## 2016-06-22 DIAGNOSIS — E559 Vitamin D deficiency, unspecified: Secondary | ICD-10-CM | POA: Diagnosis not present

## 2016-06-22 DIAGNOSIS — I10 Essential (primary) hypertension: Secondary | ICD-10-CM | POA: Diagnosis not present

## 2016-06-22 DIAGNOSIS — J309 Allergic rhinitis, unspecified: Secondary | ICD-10-CM | POA: Diagnosis not present

## 2016-06-22 DIAGNOSIS — E785 Hyperlipidemia, unspecified: Secondary | ICD-10-CM | POA: Diagnosis not present

## 2016-06-22 DIAGNOSIS — E039 Hypothyroidism, unspecified: Secondary | ICD-10-CM | POA: Diagnosis not present

## 2016-06-22 DIAGNOSIS — Z23 Encounter for immunization: Secondary | ICD-10-CM | POA: Diagnosis not present

## 2016-07-11 DIAGNOSIS — N762 Acute vulvitis: Secondary | ICD-10-CM | POA: Diagnosis not present

## 2016-07-17 ENCOUNTER — Ambulatory Visit: Payer: Medicare Other

## 2016-08-07 ENCOUNTER — Ambulatory Visit
Admission: RE | Admit: 2016-08-07 | Discharge: 2016-08-07 | Disposition: A | Discharge status: Home / Self Care | Payer: Medicare Other | Source: Ambulatory Visit | Attending: Obstetrics and Gynecology | Admitting: Obstetrics and Gynecology

## 2016-08-07 DIAGNOSIS — Z1231 Encounter for screening mammogram for malignant neoplasm of breast: Secondary | ICD-10-CM

## 2016-08-07 DIAGNOSIS — M1712 Unilateral primary osteoarthritis, left knee: Secondary | ICD-10-CM | POA: Diagnosis not present

## 2016-08-21 DIAGNOSIS — L723 Sebaceous cyst: Secondary | ICD-10-CM | POA: Diagnosis not present

## 2016-08-21 DIAGNOSIS — Z01419 Encounter for gynecological examination (general) (routine) without abnormal findings: Secondary | ICD-10-CM | POA: Diagnosis not present

## 2016-08-21 DIAGNOSIS — Z6827 Body mass index (BMI) 27.0-27.9, adult: Secondary | ICD-10-CM | POA: Diagnosis not present

## 2016-08-30 DIAGNOSIS — Z79899 Other long term (current) drug therapy: Secondary | ICD-10-CM | POA: Diagnosis not present

## 2016-08-30 DIAGNOSIS — I10 Essential (primary) hypertension: Secondary | ICD-10-CM | POA: Diagnosis not present

## 2016-08-30 DIAGNOSIS — Z0000 Encounter for general adult medical examination without abnormal findings: Secondary | ICD-10-CM | POA: Diagnosis not present

## 2016-08-30 DIAGNOSIS — G4700 Insomnia, unspecified: Secondary | ICD-10-CM | POA: Diagnosis not present

## 2016-08-30 DIAGNOSIS — M179 Osteoarthritis of knee, unspecified: Secondary | ICD-10-CM | POA: Diagnosis not present

## 2016-08-30 DIAGNOSIS — E039 Hypothyroidism, unspecified: Secondary | ICD-10-CM | POA: Diagnosis not present

## 2016-08-30 DIAGNOSIS — L719 Rosacea, unspecified: Secondary | ICD-10-CM | POA: Diagnosis not present

## 2016-08-30 DIAGNOSIS — E559 Vitamin D deficiency, unspecified: Secondary | ICD-10-CM | POA: Diagnosis not present

## 2016-08-30 DIAGNOSIS — Z23 Encounter for immunization: Secondary | ICD-10-CM | POA: Diagnosis not present

## 2016-08-30 DIAGNOSIS — Z1389 Encounter for screening for other disorder: Secondary | ICD-10-CM | POA: Diagnosis not present

## 2016-08-30 DIAGNOSIS — E785 Hyperlipidemia, unspecified: Secondary | ICD-10-CM | POA: Diagnosis not present

## 2016-08-30 DIAGNOSIS — J309 Allergic rhinitis, unspecified: Secondary | ICD-10-CM | POA: Diagnosis not present

## 2016-09-06 DIAGNOSIS — I10 Essential (primary) hypertension: Secondary | ICD-10-CM | POA: Diagnosis not present

## 2016-10-02 DIAGNOSIS — H04123 Dry eye syndrome of bilateral lacrimal glands: Secondary | ICD-10-CM | POA: Diagnosis not present

## 2016-10-02 DIAGNOSIS — H5203 Hypermetropia, bilateral: Secondary | ICD-10-CM | POA: Diagnosis not present

## 2016-10-22 DIAGNOSIS — L821 Other seborrheic keratosis: Secondary | ICD-10-CM | POA: Diagnosis not present

## 2016-10-22 DIAGNOSIS — D225 Melanocytic nevi of trunk: Secondary | ICD-10-CM | POA: Diagnosis not present

## 2017-02-27 DIAGNOSIS — I10 Essential (primary) hypertension: Secondary | ICD-10-CM | POA: Diagnosis not present

## 2017-04-16 DIAGNOSIS — H1131 Conjunctival hemorrhage, right eye: Secondary | ICD-10-CM | POA: Diagnosis not present

## 2017-04-30 DIAGNOSIS — M8588 Other specified disorders of bone density and structure, other site: Secondary | ICD-10-CM | POA: Diagnosis not present

## 2017-05-08 DIAGNOSIS — Z23 Encounter for immunization: Secondary | ICD-10-CM | POA: Diagnosis not present

## 2017-05-22 NOTE — Telephone Encounter (Signed)
done

## 2017-07-15 DIAGNOSIS — S161XXA Strain of muscle, fascia and tendon at neck level, initial encounter: Secondary | ICD-10-CM | POA: Diagnosis not present

## 2017-07-18 ENCOUNTER — Other Ambulatory Visit: Payer: Self-pay | Admitting: Internal Medicine

## 2017-07-18 DIAGNOSIS — Z1231 Encounter for screening mammogram for malignant neoplasm of breast: Secondary | ICD-10-CM

## 2017-08-01 DIAGNOSIS — S161XXD Strain of muscle, fascia and tendon at neck level, subsequent encounter: Secondary | ICD-10-CM | POA: Diagnosis not present

## 2017-08-01 DIAGNOSIS — S39012D Strain of muscle, fascia and tendon of lower back, subsequent encounter: Secondary | ICD-10-CM | POA: Diagnosis not present

## 2017-08-08 ENCOUNTER — Ambulatory Visit
Admission: RE | Admit: 2017-08-08 | Discharge: 2017-08-08 | Disposition: A | Discharge status: Home / Self Care | Payer: Medicare Other | Source: Ambulatory Visit | Attending: Internal Medicine | Admitting: Internal Medicine

## 2017-08-08 ENCOUNTER — Ambulatory Visit: Payer: Medicare Other

## 2017-08-08 DIAGNOSIS — Z1231 Encounter for screening mammogram for malignant neoplasm of breast: Secondary | ICD-10-CM | POA: Diagnosis not present

## 2017-08-09 ENCOUNTER — Other Ambulatory Visit: Payer: Self-pay | Admitting: Obstetrics and Gynecology

## 2017-08-09 DIAGNOSIS — Z1231 Encounter for screening mammogram for malignant neoplasm of breast: Secondary | ICD-10-CM

## 2017-08-13 DIAGNOSIS — E785 Hyperlipidemia, unspecified: Secondary | ICD-10-CM | POA: Diagnosis not present

## 2017-08-13 DIAGNOSIS — E039 Hypothyroidism, unspecified: Secondary | ICD-10-CM | POA: Diagnosis not present

## 2017-08-13 DIAGNOSIS — I10 Essential (primary) hypertension: Secondary | ICD-10-CM | POA: Diagnosis not present

## 2017-08-13 DIAGNOSIS — M179 Osteoarthritis of knee, unspecified: Secondary | ICD-10-CM | POA: Diagnosis not present

## 2017-08-22 DIAGNOSIS — Z6828 Body mass index (BMI) 28.0-28.9, adult: Secondary | ICD-10-CM | POA: Diagnosis not present

## 2017-08-22 DIAGNOSIS — Z01419 Encounter for gynecological examination (general) (routine) without abnormal findings: Secondary | ICD-10-CM | POA: Diagnosis not present

## 2017-09-05 DIAGNOSIS — I10 Essential (primary) hypertension: Secondary | ICD-10-CM | POA: Diagnosis not present

## 2017-09-05 DIAGNOSIS — G4700 Insomnia, unspecified: Secondary | ICD-10-CM | POA: Diagnosis not present

## 2017-09-05 DIAGNOSIS — F419 Anxiety disorder, unspecified: Secondary | ICD-10-CM | POA: Diagnosis not present

## 2017-09-05 DIAGNOSIS — E559 Vitamin D deficiency, unspecified: Secondary | ICD-10-CM | POA: Diagnosis not present

## 2017-09-05 DIAGNOSIS — Z0000 Encounter for general adult medical examination without abnormal findings: Secondary | ICD-10-CM | POA: Diagnosis not present

## 2017-09-05 DIAGNOSIS — J309 Allergic rhinitis, unspecified: Secondary | ICD-10-CM | POA: Diagnosis not present

## 2017-09-05 DIAGNOSIS — M179 Osteoarthritis of knee, unspecified: Secondary | ICD-10-CM | POA: Diagnosis not present

## 2017-09-05 DIAGNOSIS — E039 Hypothyroidism, unspecified: Secondary | ICD-10-CM | POA: Diagnosis not present

## 2017-09-05 DIAGNOSIS — E785 Hyperlipidemia, unspecified: Secondary | ICD-10-CM | POA: Diagnosis not present

## 2017-09-05 DIAGNOSIS — Z1389 Encounter for screening for other disorder: Secondary | ICD-10-CM | POA: Diagnosis not present

## 2017-09-05 DIAGNOSIS — Z1211 Encounter for screening for malignant neoplasm of colon: Secondary | ICD-10-CM | POA: Diagnosis not present

## 2017-09-05 DIAGNOSIS — Z79899 Other long term (current) drug therapy: Secondary | ICD-10-CM | POA: Diagnosis not present

## 2017-09-05 DIAGNOSIS — Z7189 Other specified counseling: Secondary | ICD-10-CM | POA: Diagnosis not present

## 2017-09-05 DIAGNOSIS — L719 Rosacea, unspecified: Secondary | ICD-10-CM | POA: Diagnosis not present

## 2017-09-05 DIAGNOSIS — M8580 Other specified disorders of bone density and structure, unspecified site: Secondary | ICD-10-CM | POA: Diagnosis not present

## 2017-10-24 DIAGNOSIS — D1801 Hemangioma of skin and subcutaneous tissue: Secondary | ICD-10-CM | POA: Diagnosis not present

## 2017-10-24 DIAGNOSIS — L821 Other seborrheic keratosis: Secondary | ICD-10-CM | POA: Diagnosis not present

## 2017-10-24 DIAGNOSIS — L720 Epidermal cyst: Secondary | ICD-10-CM | POA: Diagnosis not present

## 2017-10-24 DIAGNOSIS — L817 Pigmented purpuric dermatosis: Secondary | ICD-10-CM | POA: Diagnosis not present

## 2017-11-25 DIAGNOSIS — E039 Hypothyroidism, unspecified: Secondary | ICD-10-CM | POA: Diagnosis not present

## 2017-11-25 DIAGNOSIS — I701 Atherosclerosis of renal artery: Secondary | ICD-10-CM | POA: Diagnosis not present

## 2017-11-25 DIAGNOSIS — M179 Osteoarthritis of knee, unspecified: Secondary | ICD-10-CM | POA: Diagnosis not present

## 2017-11-25 DIAGNOSIS — I10 Essential (primary) hypertension: Secondary | ICD-10-CM | POA: Diagnosis not present

## 2017-11-25 DIAGNOSIS — E785 Hyperlipidemia, unspecified: Secondary | ICD-10-CM | POA: Diagnosis not present

## 2017-12-16 DIAGNOSIS — I10 Essential (primary) hypertension: Secondary | ICD-10-CM | POA: Diagnosis not present

## 2017-12-16 DIAGNOSIS — I701 Atherosclerosis of renal artery: Secondary | ICD-10-CM | POA: Diagnosis not present

## 2017-12-16 DIAGNOSIS — E785 Hyperlipidemia, unspecified: Secondary | ICD-10-CM | POA: Diagnosis not present

## 2018-01-02 DIAGNOSIS — E785 Hyperlipidemia, unspecified: Secondary | ICD-10-CM | POA: Diagnosis not present

## 2018-01-02 DIAGNOSIS — I10 Essential (primary) hypertension: Secondary | ICD-10-CM | POA: Diagnosis not present

## 2018-01-02 DIAGNOSIS — I701 Atherosclerosis of renal artery: Secondary | ICD-10-CM | POA: Diagnosis not present

## 2018-02-11 DIAGNOSIS — I10 Essential (primary) hypertension: Secondary | ICD-10-CM | POA: Diagnosis not present

## 2018-02-11 DIAGNOSIS — E785 Hyperlipidemia, unspecified: Secondary | ICD-10-CM | POA: Diagnosis not present

## 2018-03-13 DIAGNOSIS — E876 Hypokalemia: Secondary | ICD-10-CM | POA: Diagnosis not present

## 2018-04-09 DIAGNOSIS — M1712 Unilateral primary osteoarthritis, left knee: Secondary | ICD-10-CM | POA: Diagnosis not present

## 2018-05-05 DIAGNOSIS — E785 Hyperlipidemia, unspecified: Secondary | ICD-10-CM | POA: Diagnosis not present

## 2018-05-05 DIAGNOSIS — Z23 Encounter for immunization: Secondary | ICD-10-CM | POA: Diagnosis not present

## 2018-05-05 DIAGNOSIS — E039 Hypothyroidism, unspecified: Secondary | ICD-10-CM | POA: Diagnosis not present

## 2018-05-05 DIAGNOSIS — E876 Hypokalemia: Secondary | ICD-10-CM | POA: Diagnosis not present

## 2018-05-05 DIAGNOSIS — I10 Essential (primary) hypertension: Secondary | ICD-10-CM | POA: Diagnosis not present

## 2018-05-22 DIAGNOSIS — Z8371 Family history of colonic polyps: Secondary | ICD-10-CM | POA: Diagnosis not present

## 2018-05-22 DIAGNOSIS — K5730 Diverticulosis of large intestine without perforation or abscess without bleeding: Secondary | ICD-10-CM | POA: Diagnosis not present

## 2018-05-22 DIAGNOSIS — Z1211 Encounter for screening for malignant neoplasm of colon: Secondary | ICD-10-CM | POA: Diagnosis not present

## 2018-06-09 DIAGNOSIS — L720 Epidermal cyst: Secondary | ICD-10-CM | POA: Diagnosis not present

## 2018-06-09 DIAGNOSIS — L821 Other seborrheic keratosis: Secondary | ICD-10-CM | POA: Diagnosis not present

## 2018-06-25 DIAGNOSIS — K5730 Diverticulosis of large intestine without perforation or abscess without bleeding: Secondary | ICD-10-CM | POA: Diagnosis not present

## 2018-06-25 DIAGNOSIS — Z1211 Encounter for screening for malignant neoplasm of colon: Secondary | ICD-10-CM | POA: Diagnosis not present

## 2018-06-25 DIAGNOSIS — Z86010 Personal history of colonic polyps: Secondary | ICD-10-CM | POA: Diagnosis not present

## 2018-08-11 ENCOUNTER — Ambulatory Visit
Admission: RE | Admit: 2018-08-11 | Discharge: 2018-08-11 | Disposition: A | Discharge status: Home / Self Care | Payer: Medicare Other | Source: Ambulatory Visit | Attending: Obstetrics and Gynecology | Admitting: Obstetrics and Gynecology

## 2018-08-11 DIAGNOSIS — Z1231 Encounter for screening mammogram for malignant neoplasm of breast: Secondary | ICD-10-CM | POA: Diagnosis not present

## 2018-08-19 ENCOUNTER — Other Ambulatory Visit: Payer: Self-pay | Admitting: Obstetrics and Gynecology

## 2018-08-19 DIAGNOSIS — Z1231 Encounter for screening mammogram for malignant neoplasm of breast: Secondary | ICD-10-CM

## 2018-08-26 DIAGNOSIS — Z01419 Encounter for gynecological examination (general) (routine) without abnormal findings: Secondary | ICD-10-CM | POA: Diagnosis not present

## 2018-08-26 DIAGNOSIS — Z6826 Body mass index (BMI) 26.0-26.9, adult: Secondary | ICD-10-CM | POA: Diagnosis not present

## 2018-09-09 DIAGNOSIS — I10 Essential (primary) hypertension: Secondary | ICD-10-CM | POA: Diagnosis not present

## 2018-09-09 DIAGNOSIS — Z79899 Other long term (current) drug therapy: Secondary | ICD-10-CM | POA: Diagnosis not present

## 2018-09-24 ENCOUNTER — Ambulatory Visit: Payer: Medicare Other | Admitting: Allergy and Immunology

## 2018-09-25 DIAGNOSIS — M179 Osteoarthritis of knee, unspecified: Secondary | ICD-10-CM | POA: Diagnosis not present

## 2018-09-25 DIAGNOSIS — E039 Hypothyroidism, unspecified: Secondary | ICD-10-CM | POA: Diagnosis not present

## 2018-09-25 DIAGNOSIS — Z1389 Encounter for screening for other disorder: Secondary | ICD-10-CM | POA: Diagnosis not present

## 2018-09-25 DIAGNOSIS — G4700 Insomnia, unspecified: Secondary | ICD-10-CM | POA: Diagnosis not present

## 2018-09-25 DIAGNOSIS — Z79899 Other long term (current) drug therapy: Secondary | ICD-10-CM | POA: Diagnosis not present

## 2018-09-25 DIAGNOSIS — I10 Essential (primary) hypertension: Secondary | ICD-10-CM | POA: Diagnosis not present

## 2018-09-25 DIAGNOSIS — Z0000 Encounter for general adult medical examination without abnormal findings: Secondary | ICD-10-CM | POA: Diagnosis not present

## 2018-09-25 DIAGNOSIS — M8580 Other specified disorders of bone density and structure, unspecified site: Secondary | ICD-10-CM | POA: Diagnosis not present

## 2018-09-25 DIAGNOSIS — E559 Vitamin D deficiency, unspecified: Secondary | ICD-10-CM | POA: Diagnosis not present

## 2018-09-25 DIAGNOSIS — E785 Hyperlipidemia, unspecified: Secondary | ICD-10-CM | POA: Diagnosis not present

## 2018-09-25 DIAGNOSIS — F418 Other specified anxiety disorders: Secondary | ICD-10-CM | POA: Diagnosis not present

## 2018-09-25 DIAGNOSIS — E876 Hypokalemia: Secondary | ICD-10-CM | POA: Diagnosis not present

## 2018-09-25 DIAGNOSIS — J309 Allergic rhinitis, unspecified: Secondary | ICD-10-CM | POA: Diagnosis not present

## 2018-09-25 DIAGNOSIS — L719 Rosacea, unspecified: Secondary | ICD-10-CM | POA: Diagnosis not present

## 2018-10-02 ENCOUNTER — Other Ambulatory Visit: Payer: Self-pay

## 2018-10-02 ENCOUNTER — Ambulatory Visit (INDEPENDENT_AMBULATORY_CARE_PROVIDER_SITE_OTHER): Payer: Medicare Other | Admitting: Allergy and Immunology

## 2018-10-02 ENCOUNTER — Encounter: Payer: Self-pay | Admitting: Allergy and Immunology

## 2018-10-02 VITALS — BP 112/52 | HR 60 | Temp 98.3°F | Resp 20 | Ht 64.0 in | Wt 153.4 lb

## 2018-10-02 DIAGNOSIS — K219 Gastro-esophageal reflux disease without esophagitis: Secondary | ICD-10-CM | POA: Diagnosis not present

## 2018-10-02 DIAGNOSIS — J3089 Other allergic rhinitis: Secondary | ICD-10-CM | POA: Diagnosis not present

## 2018-10-02 MED ORDER — FAMOTIDINE 40 MG PO TABS: ORAL_TABLET | ORAL | 5 refills | Status: AC

## 2018-10-02 MED ORDER — MONTELUKAST SODIUM 10 MG PO TABS: ORAL_TABLET | ORAL | 5 refills | Status: DC

## 2018-10-02 MED ORDER — OMEPRAZOLE 40 MG PO CPDR: DELAYED_RELEASE_CAPSULE | ORAL | 5 refills | Status: DC

## 2018-10-02 NOTE — Patient Instructions (Addendum)
  1.  Allergen avoidance measures  2.  Treat inflammation:   A.  Montelukast 10 mg tablet 1 time per day  B.  Prednisone 10 mg tablet 1 time per day for 10 days  3.  Treat reflux:   A.  Omeprazole 40 mg tablet in morning  B.  Famotidine 40 mg tablet in evening  4.  If needed:   A.  OTC antihistamine  B.  OTC nasal saline  5.  Return to clinic in 3 weeks or earlier if problem

## 2018-10-02 NOTE — Progress Notes (Signed)
[facility] [place]   NEW PATIENT NOTE  Referring Provider: No ref. provider found Primary Provider: No primary care provider on file. Date of office visit: 10/02/2018    Subjective:   Chief Complaint:  Angelica Levine (DOB: 08/04/1950) is a 68 y.o. female who presents to the clinic on 10/02/2018 with a chief complaint of Allergic Rhinitis  and Wheezing .  HPI: Angelica Levine presents to this clinic in evaluation of respiratory tract problems.  Apparently I had seen her in this clinic over 5 years ago for issues with allergies.  She states that she did wonderful for 5 years without any difficulty until the end of February.  At that point in time she woke up one night with crackling in her chest and shortness of breath that lasted about 3 days without any fever or ugly nasal discharge or ugly sputum production or chest pain or other associated systemic or constitutional symptoms.  Ever since that event she has been left with postnasal drip and throat clearing and she has been a little bit nauseated.  She always has a problem with some nasal congestion which is treated successfully with saline.  There was no obvious provoking factor giving rise to this reaction.  She has not really had a significant environmental change.  She does not believe that she has any classic reflux symptoms.  She does not consume any caffeine or chocolate or drink any alcohol.  Apparently in 2015 she did require cautery of her upper nasal airway for an episode of epistaxis that may have been precipitated by a nasal steroid.  She does have a history of childhood asthma but that has been inactive for decades.  Past Medical History:  Diagnosis Date  . Allergy   . Arthritis   . Broken hip ([place]) 1985  . High cholesterol    diet control  . Hypertension   . Hypoxemia requiring supplemental oxygen 06/22/2013  . Rosacea conjunctivitis 04/13/2013  . Sleep apnea with hypersomnolence 04/13/2013   . Thyroid disease     Past Surgical History:  Procedure Laterality Date  . BREAST SURGERY Left 1987  . JOINT REPLACEMENT    . TONSILLECTOMY  1956  . TOTAL KNEE ARTHROPLASTY Bilateral    left -2009,right-2003  . TUBAL LIGATION  1984    Allergies as of 10/02/2018      Reactions   Losartan Shortness Of Breath   Shrimp [shellfish Allergy] Anaphylaxis   Asa [aspirin] Diarrhea   Stabbing pain in intestines   Eggs Or Egg-derived Products Other (See Comments)   Reaction unknown   Influenza Vac Split [flu Virus Vaccine]    Lisinopril Cough   Penicillins Hives   Sudafed [pseudoephedrine Hcl] Hypertension      Medication List      amLODipine 2.5 MG tablet Commonly known as:  NORVASC Take 1 tablet (2.5 mg total) by mouth 2 (two) times daily.   cetirizine 10 MG tablet Commonly known as:  ZYRTEC Take 10 mg by mouth daily.   FINACEA EX Apply 1 application topically 2 (two) times daily.   levothyroxine 50 MCG tablet Commonly known as:  SYNTHROID, LEVOTHROID Take 1 tablet (50 mcg total) by mouth daily.   LORazepam 0.5 MG tablet Commonly known as:  ATIVAN Take 1 tablet (0.5 mg total) by mouth at bedtime as needed for anxiety.   metoprolol succinate 25 MG 24 hr tablet Commonly known as:  TOPROL-XL Take 25 mg by mouth daily.  sodium chloride 0.65 % Soln nasal spray Commonly known as:  OCEAN Place 1 spray into both nostrils 4 (four) times daily as needed for congestion.   Systane 0.4-0.3 % Soln Generic drug:  Polyethyl Glycol-Propyl Glycol Apply to eye as needed.       Review of systems negative except as noted in HPI / PMHx or noted below:  Review of Systems  Constitutional: Negative.   HENT: Negative.   Eyes: Negative.   Respiratory: Negative.   Cardiovascular: Negative.   Gastrointestinal: Negative.   Genitourinary: Negative.   Musculoskeletal: Negative.   Skin: Negative.   Neurological: Negative.   Endo/Heme/Allergies: Negative.   Psychiatric/Behavioral:  Negative.     Family History  Problem Relation Age of Onset  . Hyperlipidemia Mother   . Hypertension Mother   . Heart disease Father   . Diabetes Father   . Heart failure Father   . Hypertension Father   . Stroke Father   . Hyperlipidemia Sister   . High Cholesterol Sister   . Hypertension Maternal Grandmother   . Diabetes Maternal Grandmother   . Heart disease Maternal Grandmother   . Diabetes Paternal Grandmother   . Heart disease Paternal Grandmother   . Cancer - Lung Paternal Grandmother   . Heart disease Paternal Grandfather   . Alzheimer's disease Maternal Grandfather     Social History   Socioeconomic History  . Marital status: Married    Spouse name: Remo Lipps  . Number of children: 2  . Years of education: 7  . Highest education level: Not on file  Occupational History    Comment: retired  Scientific laboratory technician  . Financial resource strain: Not on file  . Food insecurity:    Worry: Not on file    Inability: Not on file  . Transportation needs:    Medical: Not on file    Non-medical: Not on file  Tobacco Use  . Smoking status: Never Smoker  . Smokeless tobacco: Never Used  Substance and Sexual Activity  . Alcohol use: No    Comment: wine once in a while  . Drug use: No  . Sexual activity: Yes    Partners: Male  Lifestyle  . Physical activity:    Days per week: Not on file    Minutes per session: Not on file  . Stress: Not on file  Relationships  . Social connections:    Talks on phone: Not on file    Gets together: Not on file    Attends religious service: Not on file    Active member of club or organization: Not on file    Attends meetings of clubs or organizations: Not on file    Relationship status: Not on file  . Intimate partner violence:    Fear of current or ex partner: Not on file    Emotionally abused: Not on file    Physically abused: Not on file    Forced sexual activity: Not on file  Other Topics Concern  . Not on file  Social History  Narrative   Married. Education: [employer].    Exercise: 2 times a week; walk and gardening   Caffeine use: none   Patient is right handed             Environmental and Social history  Lives in a house with a dry environment, cats located inside the household, no carpet in the bedroom, no plastic for the bed, no plastic for the pillow, no smokers located inside the  household.  Objective:   Vitals:   10/02/18 0948  BP: (!) 112/52  Pulse: 60  Resp: 20  Temp: 98.3 F (36.8 C)  SpO2: 97%   Height: 5\' 4"  (162.6 cm) Weight: 153 lb 6.4 oz (69.6 kg)  Physical Exam Constitutional:      Appearance: She is not diaphoretic.  HENT:     Head: Normocephalic. No right periorbital erythema or left periorbital erythema.     Right Ear: Tympanic membrane, ear canal and external ear normal.     Left Ear: Tympanic membrane, ear canal and external ear normal.     Nose: Nose normal. No mucosal edema or rhinorrhea.     Mouth/Throat:     Pharynx: Uvula midline. No oropharyngeal exudate.  Eyes:     General: Lids are normal.     Conjunctiva/sclera: Conjunctivae normal.     Pupils: Pupils are equal, round, and reactive to light.  Neck:     Thyroid: No thyromegaly.     Trachea: Trachea normal. No tracheal tenderness or tracheal deviation.  Cardiovascular:     Rate and Rhythm: Normal rate and regular rhythm.     Heart sounds: Normal heart sounds, S1 normal and S2 normal. No murmur.  Pulmonary:     Effort: Pulmonary effort is normal. No respiratory distress.     Breath sounds: Normal breath sounds. No stridor. No wheezing or rales.  Chest:     Chest wall: No tenderness.  Abdominal:     General: There is no distension.     Palpations: Abdomen is soft. There is no mass.     Tenderness: There is no abdominal tenderness. There is no guarding or rebound.  Musculoskeletal:        General: No tenderness.  Lymphadenopathy:     Head:     Right side of head: No tonsillar adenopathy.     Left side of  head: No tonsillar adenopathy.     Cervical: No cervical adenopathy.  Skin:    Coloration: Skin is not pale.     Findings: No erythema or rash.     Nails: There is no clubbing.   Neurological:     Mental Status: She is alert.     Diagnostics: Allergy skin tests were performed.  She demonstrated hypersensitivity to mold.  Assessment and Plan:    1. Perennial allergic rhinitis   2. LPRD (laryngopharyngeal reflux disease)     1.  Allergen avoidance measures  2.  Treat inflammation:   A.  Montelukast 10 mg tablet 1 time per day  B.  Prednisone 10 mg tablet 1 time per day for 10 days  3.  Treat reflux:   A.  Omeprazole 40 mg tablet in morning  B.  Famotidine 40 mg tablet in evening  4.  If needed:   A.  OTC antihistamine  B.  OTC nasal saline  5.  Return to clinic in 3 weeks or earlier if problem  Angelica Levine appears to have inflammation of her airway and I have given her anti-inflammatory agents and therapy directed against reflux as her history is consistent with a component of LPR.  She will return to this clinic in 3 weeks to assess her response to this approach and I will consider further evaluation and treatment based upon her response.  [name], MD Allergy / Immunology [place]

## 2018-10-06 ENCOUNTER — Encounter: Payer: Self-pay | Admitting: Allergy and Immunology

## 2018-10-17 ENCOUNTER — Telehealth: Payer: Self-pay | Admitting: Allergy and Immunology

## 2018-10-17 DIAGNOSIS — N898 Other specified noninflammatory disorders of vagina: Secondary | ICD-10-CM | POA: Diagnosis not present

## 2018-10-17 DIAGNOSIS — L298 Other pruritus: Secondary | ICD-10-CM | POA: Diagnosis not present

## 2018-10-17 NOTE — Telephone Encounter (Signed)
Angelica Levine called in and stated her PCP was putting her on Diflucan and wanted to know if she could take the with Montelukast?  Please advise.

## 2018-10-17 NOTE — Telephone Encounter (Signed)
That should not be a problem to take her montelukast as well as the Diflucan.

## 2018-10-17 NOTE — Telephone Encounter (Signed)
Patient advised.

## 2018-10-20 ENCOUNTER — Telehealth: Payer: Self-pay | Admitting: Allergy and Immunology

## 2018-10-20 NOTE — Telephone Encounter (Signed)
Angelica Levine called in and states she is having issues with Singulair.  Angelica Levine states when she started singulair she noticed she gets really bad headaches and diarrhea.  She states she stays up all night  And feels tense and nervous and having ringing in her ears.  Angelica Levine states she is allergic to aspirin and in the singulair pamphlet it states not to take it if you have issues with aspirin.  Please advise.

## 2018-10-20 NOTE — Telephone Encounter (Signed)
Spoke with patient and informed her of Dr. Bruna Potter suggestion. She voiced understanding and will address this issue at RV.

## 2018-10-20 NOTE — Telephone Encounter (Signed)
Please have patient discontinue Singulair and we will readdress this issue when she returns for her RV.

## 2018-10-20 NOTE — Telephone Encounter (Signed)
Please advise Dr. Neldon Mc

## 2018-10-23 ENCOUNTER — Other Ambulatory Visit: Payer: Self-pay

## 2018-10-23 ENCOUNTER — Encounter: Payer: Self-pay | Admitting: Allergy and Immunology

## 2018-10-23 ENCOUNTER — Ambulatory Visit (INDEPENDENT_AMBULATORY_CARE_PROVIDER_SITE_OTHER): Payer: Medicare Other | Admitting: Allergy and Immunology

## 2018-10-23 VITALS — BP 132/68 | HR 68 | Resp 14

## 2018-10-23 DIAGNOSIS — J3089 Other allergic rhinitis: Secondary | ICD-10-CM

## 2018-10-23 DIAGNOSIS — K219 Gastro-esophageal reflux disease without esophagitis: Secondary | ICD-10-CM

## 2018-10-23 NOTE — Progress Notes (Signed)
- [place] [facility] [place]   Follow-up Note  Referring Provider: No ref. provider found Primary Provider: Josetta Huddle, MD Date of Office Visit: 10/23/2018  Subjective:   Angelica Levine (DOB: 13-Jul-1951) is a 68 y.o. female who returns to the [facility] and [place] on 10/23/2018 in re-evaluation of the following:  HPI: Angelica Levine returns to this clinic in evaluation of LPR and allergic rhinitis.  I last saw her in this clinic on 02 October 2018 at which point time we addressed these issues.  She is much better regarding her postnasal drip and throat clearing and being nauseated and in fact has not had any of these issues.  She has altered her diet so that she no longer eats past 6 PM at nighttime and she has been consistently using a proton pump inhibitor and H2 receptor blocker.  She has had no problems with her nose and does not need to use an antihistamine at this point.  She did have an adverse reaction to Singulair and she has discontinued this medication.  Apparently she may have developed an issue with headaches and diarrhea when using this medication.  Fortunately, all those issues resolved after she discontinued Singulair.  Allergies as of 10/23/2018      Reactions   Losartan Shortness Of Breath   Shrimp [shellfish Allergy] Anaphylaxis   Asa [aspirin] Diarrhea   Stabbing pain in intestines   Eggs Or Egg-derived Products Other (See Comments)   Reaction unknown   Lisinopril Cough   Penicillins Hives   Sudafed [pseudoephedrine Hcl] Hypertension      Medication List      amLODipine 5 MG tablet Commonly known as:  NORVASC Take 5 mg by mouth daily.   cetirizine 10 MG tablet Commonly known as:  ZYRTEC Take 10 mg by mouth daily.   famotidine 40 MG tablet Commonly known as:  PEPCID Take 1 tablet by mouth once daily in the evening   FINACEA EX Apply 1 application topically 2 (two) times daily.   levothyroxine 50 MCG tablet Commonly known  as:  SYNTHROID, LEVOTHROID Take 1 tablet (50 mcg total) by mouth daily.   LORazepam 0.5 MG tablet Commonly known as:  ATIVAN Take 1 tablet (0.5 mg total) by mouth at bedtime as needed for anxiety.   metoprolol succinate 25 MG 24 hr tablet Commonly known as:  TOPROL-XL Take 25 mg by mouth daily.   omeprazole 40 MG capsule Commonly known as:  PRILOSEC Take 1 capsule by mouth once daily in the morning   sodium chloride 0.65 % Soln nasal spray Commonly known as:  OCEAN Place 1 spray into both nostrils 4 (four) times daily as needed for congestion.   Systane 0.4-0.3 % Soln Generic drug:  Polyethyl Glycol-Propyl Glycol Apply to eye as needed.   triamterene-hydrochlorothiazide 37.5-25 MG tablet Commonly known as:  MAXZIDE-25 Take 1 tablet by mouth every morning.       Past Medical History:  Diagnosis Date  . Allergy   . Arthritis   . Broken hip ([place]) 1985  . High cholesterol    diet control  . Hypertension   . Hypoxemia requiring supplemental oxygen 06/22/2013  . Rosacea conjunctivitis 04/13/2013  . Sleep apnea with hypersomnolence 04/13/2013  . Thyroid disease     Past Surgical History:  Procedure Laterality Date  . BREAST SURGERY Left 1987  . JOINT REPLACEMENT    . TONSILLECTOMY  1956  . TOTAL KNEE ARTHROPLASTY Bilateral    left -  2009,right-2003  . TUBAL LIGATION  1984    Review of systems negative except as noted in HPI / PMHx or noted below:  Review of Systems  Constitutional: Negative.   HENT: Negative.   Eyes: Negative.   Respiratory: Negative.   Cardiovascular: Negative.   Gastrointestinal: Negative.   Genitourinary: Negative.   Musculoskeletal: Negative.   Skin: Negative.   Neurological: Negative.   Endo/Heme/Allergies: Negative.   Psychiatric/Behavioral: Negative.      Objective:   Vitals:   10/23/18 1042  BP: 132/68  Pulse: 68  Resp: 14          Physical Exam Constitutional:      Appearance: She is not diaphoretic.  HENT:      Head: Normocephalic.     Right Ear: Tympanic membrane, ear canal and external ear normal.     Left Ear: Tympanic membrane, ear canal and external ear normal.     Nose: Nose normal. No mucosal edema or rhinorrhea.     Mouth/Throat:     Pharynx: Uvula midline. No oropharyngeal exudate.  Eyes:     Conjunctiva/sclera: Conjunctivae normal.  Neck:     Thyroid: No thyromegaly.     Trachea: Trachea normal. No tracheal tenderness or tracheal deviation.  Cardiovascular:     Rate and Rhythm: Normal rate and regular rhythm.     Heart sounds: Normal heart sounds, S1 normal and S2 normal. No murmur.  Pulmonary:     Effort: No respiratory distress.     Breath sounds: Normal breath sounds. No stridor. No wheezing or rales.  Lymphadenopathy:     Head:     Right side of head: No tonsillar adenopathy.     Left side of head: No tonsillar adenopathy.     Cervical: No cervical adenopathy.  Skin:    Findings: No erythema or rash.     Nails: There is no clubbing.   Neurological:     Mental Status: She is alert.     Diagnostics: none  Assessment and Plan:   1. LPRD (laryngopharyngeal reflux disease)   2. Perennial allergic rhinitis     1. Continue to Treat reflux:   A.  Omeprazole 40 mg tablet in morning  B.  Famotidine 40 mg tablet in evening  2.  If needed:   A.  OTC antihistamine  B.  OTC nasal saline  3.  Return to clinic in August 2020 or earlier if problem  Angelica Levine appears to be doing quite well on her current therapy and I would like to maintain her on an H2 receptor blocker and eukotriene modifier until the end of the summer.  If she does well then will be an opportunity to consolidate her treatment.  Angelica Katz, MD Allergy / Immunology [place]

## 2018-10-23 NOTE — Patient Instructions (Addendum)
  1. Continue to Treat reflux:   A.  Omeprazole 40 mg tablet in morning  B.  Famotidine 40 mg tablet in evening  2.  If needed:   A.  OTC antihistamine  B.  OTC nasal saline  3.  Return to clinic in August 2020 or earlier if problem

## 2018-10-26 ENCOUNTER — Encounter: Payer: Self-pay | Admitting: Allergy and Immunology

## 2018-10-29 DIAGNOSIS — E785 Hyperlipidemia, unspecified: Secondary | ICD-10-CM | POA: Diagnosis not present

## 2018-10-29 DIAGNOSIS — N898 Other specified noninflammatory disorders of vagina: Secondary | ICD-10-CM | POA: Diagnosis not present

## 2018-10-29 DIAGNOSIS — L298 Other pruritus: Secondary | ICD-10-CM | POA: Diagnosis not present

## 2018-11-11 DIAGNOSIS — D225 Melanocytic nevi of trunk: Secondary | ICD-10-CM | POA: Diagnosis not present

## 2018-11-11 DIAGNOSIS — L821 Other seborrheic keratosis: Secondary | ICD-10-CM | POA: Diagnosis not present

## 2018-11-11 DIAGNOSIS — L720 Epidermal cyst: Secondary | ICD-10-CM | POA: Diagnosis not present

## 2018-11-13 DIAGNOSIS — E785 Hyperlipidemia, unspecified: Secondary | ICD-10-CM | POA: Diagnosis not present

## 2018-11-13 DIAGNOSIS — I701 Atherosclerosis of renal artery: Secondary | ICD-10-CM | POA: Diagnosis not present

## 2018-11-13 DIAGNOSIS — M8580 Other specified disorders of bone density and structure, unspecified site: Secondary | ICD-10-CM | POA: Diagnosis not present

## 2018-11-13 DIAGNOSIS — I10 Essential (primary) hypertension: Secondary | ICD-10-CM | POA: Diagnosis not present

## 2018-11-13 DIAGNOSIS — M179 Osteoarthritis of knee, unspecified: Secondary | ICD-10-CM | POA: Diagnosis not present

## 2018-11-13 DIAGNOSIS — E039 Hypothyroidism, unspecified: Secondary | ICD-10-CM | POA: Diagnosis not present

## 2018-12-30 DIAGNOSIS — Z79899 Other long term (current) drug therapy: Secondary | ICD-10-CM | POA: Diagnosis not present

## 2018-12-30 DIAGNOSIS — E785 Hyperlipidemia, unspecified: Secondary | ICD-10-CM | POA: Diagnosis not present

## 2018-12-31 DIAGNOSIS — E785 Hyperlipidemia, unspecified: Secondary | ICD-10-CM | POA: Diagnosis not present

## 2018-12-31 DIAGNOSIS — M8580 Other specified disorders of bone density and structure, unspecified site: Secondary | ICD-10-CM | POA: Diagnosis not present

## 2018-12-31 DIAGNOSIS — Z79899 Other long term (current) drug therapy: Secondary | ICD-10-CM | POA: Diagnosis not present

## 2019-01-07 DIAGNOSIS — M179 Osteoarthritis of knee, unspecified: Secondary | ICD-10-CM | POA: Diagnosis not present

## 2019-01-07 DIAGNOSIS — I10 Essential (primary) hypertension: Secondary | ICD-10-CM | POA: Diagnosis not present

## 2019-01-07 DIAGNOSIS — E039 Hypothyroidism, unspecified: Secondary | ICD-10-CM | POA: Diagnosis not present

## 2019-01-07 DIAGNOSIS — M8580 Other specified disorders of bone density and structure, unspecified site: Secondary | ICD-10-CM | POA: Diagnosis not present

## 2019-01-07 DIAGNOSIS — I701 Atherosclerosis of renal artery: Secondary | ICD-10-CM | POA: Diagnosis not present

## 2019-01-07 DIAGNOSIS — E785 Hyperlipidemia, unspecified: Secondary | ICD-10-CM | POA: Diagnosis not present

## 2019-02-12 DIAGNOSIS — I10 Essential (primary) hypertension: Secondary | ICD-10-CM | POA: Diagnosis not present

## 2019-02-12 DIAGNOSIS — E039 Hypothyroidism, unspecified: Secondary | ICD-10-CM | POA: Diagnosis not present

## 2019-02-12 DIAGNOSIS — M8580 Other specified disorders of bone density and structure, unspecified site: Secondary | ICD-10-CM | POA: Diagnosis not present

## 2019-02-12 DIAGNOSIS — I701 Atherosclerosis of renal artery: Secondary | ICD-10-CM | POA: Diagnosis not present

## 2019-02-12 DIAGNOSIS — M179 Osteoarthritis of knee, unspecified: Secondary | ICD-10-CM | POA: Diagnosis not present

## 2019-02-12 DIAGNOSIS — E785 Hyperlipidemia, unspecified: Secondary | ICD-10-CM | POA: Diagnosis not present

## 2019-02-23 DIAGNOSIS — H5203 Hypermetropia, bilateral: Secondary | ICD-10-CM | POA: Diagnosis not present

## 2019-02-23 DIAGNOSIS — H2513 Age-related nuclear cataract, bilateral: Secondary | ICD-10-CM | POA: Diagnosis not present

## 2019-03-12 DIAGNOSIS — K5730 Diverticulosis of large intestine without perforation or abscess without bleeding: Secondary | ICD-10-CM | POA: Diagnosis not present

## 2019-03-12 DIAGNOSIS — K5904 Chronic idiopathic constipation: Secondary | ICD-10-CM | POA: Diagnosis not present

## 2019-03-12 DIAGNOSIS — Z86010 Personal history of colonic polyps: Secondary | ICD-10-CM | POA: Diagnosis not present

## 2019-03-12 DIAGNOSIS — Z8371 Family history of colonic polyps: Secondary | ICD-10-CM | POA: Diagnosis not present

## 2019-03-13 DIAGNOSIS — N819 Female genital prolapse, unspecified: Secondary | ICD-10-CM | POA: Diagnosis not present

## 2019-03-16 DIAGNOSIS — N819 Female genital prolapse, unspecified: Secondary | ICD-10-CM | POA: Diagnosis not present

## 2019-03-18 ENCOUNTER — Ambulatory Visit (INDEPENDENT_AMBULATORY_CARE_PROVIDER_SITE_OTHER): Payer: Medicare Other | Admitting: Allergy and Immunology

## 2019-03-18 ENCOUNTER — Encounter: Payer: Self-pay | Admitting: Allergy and Immunology

## 2019-03-18 ENCOUNTER — Other Ambulatory Visit: Payer: Self-pay

## 2019-03-18 VITALS — BP 116/70 | HR 74 | Temp 98.4°F | Resp 16

## 2019-03-18 DIAGNOSIS — K219 Gastro-esophageal reflux disease without esophagitis: Secondary | ICD-10-CM

## 2019-03-18 DIAGNOSIS — J3089 Other allergic rhinitis: Secondary | ICD-10-CM | POA: Diagnosis not present

## 2019-03-18 NOTE — Patient Instructions (Addendum)
  1. Continue to Treat reflux:   A.  maintain ideal body weight  B.  Minimize caffeine and chocolate consumption  C.  No late meals  D.  No "fill up" meals  2.  If needed:   A.  OTC antihistamine  B.  OTC nasal saline  3.  Obtain fall flu vaccine (and COVID vaccine)  4.  Return to clinic if needed

## 2019-03-18 NOTE — Progress Notes (Signed)
[place]   Follow-up Note  Referring Provider: Josetta Huddle, MD Primary Provider: Josetta Huddle, MD Date of Office Visit: 03/18/2019  Subjective:   Angelica Levine (DOB: [birth year]) is a 68 y.o. female who returns to the Allergy and [place] on 03/18/2019 in re-evaluation of the following:  HPI: Angelica Levine returns to this clinic in evaluation of LPR and allergic rhinitis.  Her last visit to this clinic was 23 October 2018.  She has no issues with LPR.  She has no postnasal drip and no throat clearing and she is no longer nauseated.  She has been able to taper off both her proton pump inhibitor and her H2 receptor blocker while performing behavioral changes.  She has lost 14 pounds of weight.  She has altered her diet so that she never fills up with a meal.  She does not eat any late meals.  She has had no issues with her nose.  She intermittently uses over-the-counter antihistamines and nasal saline.  Allergies as of 03/18/2019      Reactions   Losartan Shortness Of Breath   Shrimp [shellfish Allergy] Anaphylaxis   Asa [aspirin] Diarrhea   Stabbing pain in intestines   Eggs Or Egg-derived Products Other (See Comments)   Reaction unknown   Linaclotide    Linzess- increased heart rate   Lisinopril Cough   Penicillins Hives   Sudafed [pseudoephedrine Hcl] Hypertension      Medication List    amLODipine 5 MG tablet Commonly known as: NORVASC Take 5 mg by mouth daily.   cetirizine 10 MG tablet Commonly known as: ZYRTEC Take 10 mg by mouth daily.   famotidine 40 MG tablet Commonly known as: PEPCID Take 1 tablet by mouth once daily in the evening   FINACEA EX Apply 1 application topically 2 (two) times daily.   levothyroxine 50 MCG tablet Commonly known as: SYNTHROID Take 1 tablet (50 mcg total) by mouth daily.   LORazepam 0.5 MG tablet Commonly known as: ATIVAN Take 1 tablet (0.5 mg total) by mouth at bedtime as needed  for anxiety.   metoprolol succinate 25 MG 24 hr tablet Commonly known as: TOPROL-XL Take 25 mg by mouth daily.   sodium chloride 0.65 % Soln nasal spray Commonly known as: OCEAN Place 1 spray into both nostrils 4 (four) times daily as needed for congestion.   Systane 0.4-0.3 % Soln Generic drug: Polyethyl Glycol-Propyl Glycol Apply to eye as needed.   triamterene-hydrochlorothiazide 37.5-25 MG tablet Commonly known as: MAXZIDE-25 Take 1 tablet by mouth every morning.   UNABLE TO FIND Ultra Flora Plus Probiotics   VITAMIN D3 PO Vitamin D3       Past Medical History:  Diagnosis Date  . Allergy   . Arthritis   . Broken hip ([place]) 1985  . High cholesterol    diet control  . Hypertension   . Hypoxemia requiring supplemental oxygen 06/22/2013  . Rosacea conjunctivitis 04/13/2013  . Sleep apnea with hypersomnolence 04/13/2013  . Thyroid disease     Past Surgical History:  Procedure Laterality Date  . BREAST SURGERY Left 1987  . JOINT REPLACEMENT    . TONSILLECTOMY  1956  . TOTAL KNEE ARTHROPLASTY Bilateral    left -2009,right-2003  . TUBAL LIGATION  1984    Review of systems negative except as noted in HPI / PMHx or noted below:  Review of Systems  Constitutional: Negative.   HENT: Negative.   Eyes: Negative.  Respiratory: Negative.   Cardiovascular: Negative.   Gastrointestinal: Negative.   Genitourinary: Negative.   Musculoskeletal: Negative.   Skin: Negative.   Neurological: Negative.   Endo/Heme/Allergies: Negative.   Psychiatric/Behavioral: Negative.      Objective:   Vitals:   03/18/19 1041  BP: 116/70  Pulse: 74  Resp: 16  Temp: 98.4 F (36.9 C)  SpO2: 95%          Physical Exam Constitutional:      Appearance: She is not diaphoretic.  HENT:     Head: Normocephalic.     Right Ear: Tympanic membrane, ear canal and external ear normal.     Left Ear: Tympanic membrane, ear canal and external ear normal.     Nose: Nose normal. No  mucosal edema or rhinorrhea.     Mouth/Throat:     Pharynx: Uvula midline. No oropharyngeal exudate.  Eyes:     Conjunctiva/sclera: Conjunctivae normal.  Neck:     Thyroid: No thyromegaly.     Trachea: Trachea normal. No tracheal tenderness or tracheal deviation.  Cardiovascular:     Rate and Rhythm: Normal rate and regular rhythm.     Heart sounds: Normal heart sounds, S1 normal and S2 normal. No murmur (Split P2 vs. S4).  Pulmonary:     Effort: No respiratory distress.     Breath sounds: Normal breath sounds. No stridor. No wheezing or rales.  Lymphadenopathy:     Head:     Right side of head: No tonsillar adenopathy.     Left side of head: No tonsillar adenopathy.     Cervical: No cervical adenopathy.  Skin:    Findings: No erythema or rash.     Nails: There is no clubbing.   Neurological:     Mental Status: She is alert.     Diagnostics: none  Assessment and Plan:   1. LPRD (laryngopharyngeal reflux disease)   2. Perennial allergic rhinitis     1. Continue to Treat reflux:   A.  maintain ideal body weight  B.  Minimize caffeine and chocolate consumption  C.  No late meals  D.  No "fill up" meals  2.  If needed:   A.  OTC antihistamine  B.  OTC nasal saline  3.  Obtain fall flu vaccine (and COVID vaccine)  4.  Return to clinic if needed  Angelica Levine appears to be doing quite well.  Her behavioral changes have really resulted in excellent control of her reflux and LPR.  She does not appear to have much issue with her upper airways.  We will now see her back in this clinic if needed.  Angelica Katz, MD Allergy / Immunology [place]

## 2019-03-19 ENCOUNTER — Encounter: Payer: Self-pay | Admitting: Allergy and Immunology

## 2019-03-19 ENCOUNTER — Ambulatory Visit: Payer: Medicare Other | Admitting: Allergy and Immunology

## 2019-03-19 DIAGNOSIS — E785 Hyperlipidemia, unspecified: Secondary | ICD-10-CM | POA: Diagnosis not present

## 2019-04-09 DIAGNOSIS — K5904 Chronic idiopathic constipation: Secondary | ICD-10-CM | POA: Diagnosis not present

## 2019-04-09 DIAGNOSIS — Z86010 Personal history of colonic polyps: Secondary | ICD-10-CM | POA: Diagnosis not present

## 2019-04-09 DIAGNOSIS — K5730 Diverticulosis of large intestine without perforation or abscess without bleeding: Secondary | ICD-10-CM | POA: Diagnosis not present

## 2019-04-15 DIAGNOSIS — K5904 Chronic idiopathic constipation: Secondary | ICD-10-CM | POA: Diagnosis not present

## 2019-04-15 DIAGNOSIS — M6281 Muscle weakness (generalized): Secondary | ICD-10-CM | POA: Diagnosis not present

## 2019-04-15 DIAGNOSIS — N819 Female genital prolapse, unspecified: Secondary | ICD-10-CM | POA: Diagnosis not present

## 2019-04-15 DIAGNOSIS — M62838 Other muscle spasm: Secondary | ICD-10-CM | POA: Diagnosis not present

## 2019-04-20 DIAGNOSIS — K5904 Chronic idiopathic constipation: Secondary | ICD-10-CM | POA: Diagnosis not present

## 2019-04-20 DIAGNOSIS — N819 Female genital prolapse, unspecified: Secondary | ICD-10-CM | POA: Diagnosis not present

## 2019-04-20 DIAGNOSIS — M62838 Other muscle spasm: Secondary | ICD-10-CM | POA: Diagnosis not present

## 2019-04-20 DIAGNOSIS — M6281 Muscle weakness (generalized): Secondary | ICD-10-CM | POA: Diagnosis not present

## 2019-04-28 DIAGNOSIS — N819 Female genital prolapse, unspecified: Secondary | ICD-10-CM | POA: Diagnosis not present

## 2019-04-28 DIAGNOSIS — M6281 Muscle weakness (generalized): Secondary | ICD-10-CM | POA: Diagnosis not present

## 2019-04-28 DIAGNOSIS — K5904 Chronic idiopathic constipation: Secondary | ICD-10-CM | POA: Diagnosis not present

## 2019-04-28 DIAGNOSIS — M62838 Other muscle spasm: Secondary | ICD-10-CM | POA: Diagnosis not present

## 2019-05-12 DIAGNOSIS — S72002A Fracture of unspecified part of neck of left femur, initial encounter for closed fracture: Secondary | ICD-10-CM | POA: Diagnosis not present

## 2019-05-12 DIAGNOSIS — M810 Age-related osteoporosis without current pathological fracture: Secondary | ICD-10-CM | POA: Diagnosis not present

## 2019-05-12 DIAGNOSIS — Z8262 Family history of osteoporosis: Secondary | ICD-10-CM | POA: Diagnosis not present

## 2019-05-12 DIAGNOSIS — M6281 Muscle weakness (generalized): Secondary | ICD-10-CM | POA: Diagnosis not present

## 2019-05-12 DIAGNOSIS — Z96652 Presence of left artificial knee joint: Secondary | ICD-10-CM | POA: Diagnosis not present

## 2019-05-12 DIAGNOSIS — N819 Female genital prolapse, unspecified: Secondary | ICD-10-CM | POA: Diagnosis not present

## 2019-05-12 DIAGNOSIS — K5904 Chronic idiopathic constipation: Secondary | ICD-10-CM | POA: Diagnosis not present

## 2019-05-12 DIAGNOSIS — M62838 Other muscle spasm: Secondary | ICD-10-CM | POA: Diagnosis not present

## 2019-05-18 DIAGNOSIS — M810 Age-related osteoporosis without current pathological fracture: Secondary | ICD-10-CM | POA: Diagnosis not present

## 2019-05-26 DIAGNOSIS — M62838 Other muscle spasm: Secondary | ICD-10-CM | POA: Diagnosis not present

## 2019-05-26 DIAGNOSIS — M6281 Muscle weakness (generalized): Secondary | ICD-10-CM | POA: Diagnosis not present

## 2019-05-26 DIAGNOSIS — Z23 Encounter for immunization: Secondary | ICD-10-CM | POA: Diagnosis not present

## 2019-05-26 DIAGNOSIS — K5904 Chronic idiopathic constipation: Secondary | ICD-10-CM | POA: Diagnosis not present

## 2019-05-26 DIAGNOSIS — N819 Female genital prolapse, unspecified: Secondary | ICD-10-CM | POA: Diagnosis not present

## 2019-06-02 DIAGNOSIS — S0501XA Injury of conjunctiva and corneal abrasion without foreign body, right eye, initial encounter: Secondary | ICD-10-CM | POA: Diagnosis not present

## 2019-06-15 DIAGNOSIS — Z0184 Encounter for antibody response examination: Secondary | ICD-10-CM | POA: Diagnosis not present

## 2019-06-15 DIAGNOSIS — M810 Age-related osteoporosis without current pathological fracture: Secondary | ICD-10-CM | POA: Diagnosis not present

## 2019-06-16 DIAGNOSIS — M6281 Muscle weakness (generalized): Secondary | ICD-10-CM | POA: Diagnosis not present

## 2019-06-16 DIAGNOSIS — K5730 Diverticulosis of large intestine without perforation or abscess without bleeding: Secondary | ICD-10-CM | POA: Diagnosis not present

## 2019-06-16 DIAGNOSIS — Z86010 Personal history of colonic polyps: Secondary | ICD-10-CM | POA: Diagnosis not present

## 2019-06-16 DIAGNOSIS — N819 Female genital prolapse, unspecified: Secondary | ICD-10-CM | POA: Diagnosis not present

## 2019-06-16 DIAGNOSIS — K5904 Chronic idiopathic constipation: Secondary | ICD-10-CM | POA: Diagnosis not present

## 2019-06-16 DIAGNOSIS — Z8371 Family history of colonic polyps: Secondary | ICD-10-CM | POA: Diagnosis not present

## 2019-06-16 DIAGNOSIS — M62838 Other muscle spasm: Secondary | ICD-10-CM | POA: Diagnosis not present

## 2019-06-17 DIAGNOSIS — Z0184 Encounter for antibody response examination: Secondary | ICD-10-CM | POA: Diagnosis not present

## 2019-08-06 DIAGNOSIS — N819 Female genital prolapse, unspecified: Secondary | ICD-10-CM | POA: Diagnosis not present

## 2019-08-31 DIAGNOSIS — Z01419 Encounter for gynecological examination (general) (routine) without abnormal findings: Secondary | ICD-10-CM | POA: Diagnosis not present

## 2019-08-31 DIAGNOSIS — Z6825 Body mass index (BMI) 25.0-25.9, adult: Secondary | ICD-10-CM | POA: Diagnosis not present

## 2019-09-08 ENCOUNTER — Other Ambulatory Visit: Payer: Self-pay

## 2019-09-08 ENCOUNTER — Ambulatory Visit
Admission: RE | Admit: 2019-09-08 | Discharge: 2019-09-08 | Disposition: A | Discharge status: Home / Self Care | Payer: Medicare Other | Source: Ambulatory Visit | Attending: Obstetrics and Gynecology | Admitting: Obstetrics and Gynecology

## 2019-09-08 DIAGNOSIS — Z1231 Encounter for screening mammogram for malignant neoplasm of breast: Secondary | ICD-10-CM | POA: Diagnosis not present

## 2019-09-15 DIAGNOSIS — I10 Essential (primary) hypertension: Secondary | ICD-10-CM | POA: Diagnosis not present

## 2019-09-15 DIAGNOSIS — N814 Uterovaginal prolapse, unspecified: Secondary | ICD-10-CM | POA: Diagnosis not present

## 2019-10-06 NOTE — H&P (Signed)
NAMEKATIANNE, FENCL MEDICAL RECORD [id number] ACCOUNT [id number] DATE OF BIRTH:Aug 30, 1950 FACILITY: [facility] LOCATION:  PHYSICIAN:Altie Savard Garry Heater, MD  HISTORY AND PHYSICAL  DATE OF ADMISSION:  10/26/2019  CHIEF COMPLAINT:  Increased pelvic pressure, uterine descensus.  HISTORY OF PRESENT ILLNESS:  A 69 year old G3 P2 LC2, postmenopausal patient who over the last several years has noted increased rectal pressure and pushing down sensation when she is on her feet.  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  This has gotten to the point that she prefers to have definitive surgical treatment.  We have discussed at length LAVH, possible BSO, anterior and posterior colporrhaphy.  Specific risks regarding bleeding,  infection, adjacent organ injury, wound infection, phlebitis along with her expected recovery time reviewed.  Rationale for either removal of tubes and/or ovaries discussed with her.  PAST MEDICAL HISTORY:    ALLERGIES:  ASPIRIN, EGGS, LINZESS, PENICILLINS CAUSE A RASH, SUDAFED.  REVIEW OF SYSTEMS:  Significant for osteoporosis, rosacea, hypothyroidism, essential hypertension.  MEDICATIONS:  Amlodipine, Azelaic acid 15% topical gel to apply topically, Caltrate, levothyroxine 50 mcg, metoprolol extended release, triamterene/HCTZ, vitamin D3, and Zyrtec as needed.  SOCIAL HISTORY:  She is married, retired, she is a never smoker, moderate exercise.  Occasional alcohol use.  PAST SURGICAL HISTORY:  Tubal ligation, eye surgery, hip surgery, knee surgery, tonsillectomy, total knee replacement.  FAMILY HISTORY:  Significant for mother with hypertension and Alzheimer disease, father with diabetes and unspecified heart disease.  PHYSICAL EXAMINATION: VITAL SIGNS:  Blood pressure 130/68, temperature 98.2.  BMI 25.7. HEENT:  Unremarkable. NECK:  Supple, without masses. LUNGS:  Clear. CARDIOVASCULAR:  Regular rate and rhythm without  murmurs, rubs or gallops. BREASTS:  Without masses. ABDOMEN:  Soft, flat, nontender. PELVIC:  Vulva, vagina, cervix noted on straining she has a mild uterine prolapse with cystocele.  Also a mild rectocele noted.  Her apical support looked reasonably normal to me. NEUROLOGIC:  Unremarkable.  IMPRESSION:  Symptomatic uterine prolapse with cystocele and rectocele.  PLAN:  LAVH, possible BSO or salpingectomy, anterior and posterior repair.  Procedure and risks reviewed as above.  CN/NUANCE  D:10/05/2019 T:10/05/2019 JOB:[id number]

## 2019-10-09 DIAGNOSIS — M1712 Unilateral primary osteoarthritis, left knee: Secondary | ICD-10-CM | POA: Diagnosis not present

## 2019-10-12 DIAGNOSIS — G4700 Insomnia, unspecified: Secondary | ICD-10-CM | POA: Diagnosis not present

## 2019-10-12 DIAGNOSIS — E039 Hypothyroidism, unspecified: Secondary | ICD-10-CM | POA: Diagnosis not present

## 2019-10-12 DIAGNOSIS — F418 Other specified anxiety disorders: Secondary | ICD-10-CM | POA: Diagnosis not present

## 2019-10-12 DIAGNOSIS — E559 Vitamin D deficiency, unspecified: Secondary | ICD-10-CM | POA: Diagnosis not present

## 2019-10-12 DIAGNOSIS — I701 Atherosclerosis of renal artery: Secondary | ICD-10-CM | POA: Diagnosis not present

## 2019-10-12 DIAGNOSIS — Z0184 Encounter for antibody response examination: Secondary | ICD-10-CM | POA: Diagnosis not present

## 2019-10-12 DIAGNOSIS — I10 Essential (primary) hypertension: Secondary | ICD-10-CM | POA: Diagnosis not present

## 2019-10-12 DIAGNOSIS — Z1389 Encounter for screening for other disorder: Secondary | ICD-10-CM | POA: Diagnosis not present

## 2019-10-12 DIAGNOSIS — Z23 Encounter for immunization: Secondary | ICD-10-CM | POA: Diagnosis not present

## 2019-10-12 DIAGNOSIS — E785 Hyperlipidemia, unspecified: Secondary | ICD-10-CM | POA: Diagnosis not present

## 2019-10-12 DIAGNOSIS — M810 Age-related osteoporosis without current pathological fracture: Secondary | ICD-10-CM | POA: Diagnosis not present

## 2019-10-12 DIAGNOSIS — Z0000 Encounter for general adult medical examination without abnormal findings: Secondary | ICD-10-CM | POA: Diagnosis not present

## 2019-10-15 NOTE — Patient Instructions (Addendum)
YOU ARE SCHEDULED FOR A COVID TEST 10-22-19@ 9:00 AM. THIS TEST MUST BE DONE BEFORE SURGERY. GO TO  [address], [place], [address] AND REMAIN IN YOUR CAR, THIS IS A DRIVE UP TEST. ONCE YOUR COVID TEST IS DONE PLEASE FOLLOW ALL THE QUARANTINE  INSTRUCTIONS GIVEN IN YOUR HANDOUT.      Your procedure is scheduled on  10-26-19   Report to [facility] AT  5:30 A. M.   Call this number if you have problems the morning of surgery  :[phone].   OUR ADDRESS IS [place].  WE ARE LOCATED IN [facility].                                     REMEMBER: DO NOT EAT FOOD OR DRINK LIQUIDS AFTER MIDNIGHT .     TAKE THESE MEDICATIONS MORNING OF SURGERY WITH A SIP OF WATER: Cetirizine (Zyrtec), Levothyroxine (Synthroid), Metoprolol Succinate. You may also use and bring your nasal spray.   YOU MAY  BRUSH YOUR TEETH MORNING OF SURGERY AND RINSE YOUR MOUTH OUT, NO CHEWING GUM CANDY OR MINTS.    IF YOU ARE SPENDING THE NIGHT AFTER SURGERY PLEASE BRING ALL YOUR PRESCRIPTION MEDICATIONS IN THEIR ORIGINAL BOTTLES.   1 VISITOR IS ALLOWED IN WAITING ROOM ONLY DAY OF SURGERY.   NO VISITOR MAY SPEND THE NIGHT. VISITOR ARE ALLOWED TO STAY UNTIL 800 PM.                                    DO NOT WEAR JEWERLY, MAKE UP, OR NAIL POLISH ON FINGERNAILS.  DO NOT WEAR LOTIONS, POWDERS, PERFUMES OR DEODORANT.  DO NOT SHAVE FOR 24 HOURS PRIOR TO DAY OF SURGERY.  CONTACTS, GLASSES, OR DENTURES MAY NOT BE WORN TO SURGERY.                                    [facility] IS NOT RESPONSIBLE  FOR ANY BELONGINGS.                                                                    Marland Kitchen                                                                                                    [facility] - Preparing for Surgery Before surgery, you can play an important role.  Because skin is not sterile, your skin needs to be as free of germs as possible.  You can reduce the number of  germs on your skin by washing with CHG (chlorahexidine  gluconate) soap before surgery.  CHG is an antiseptic cleaner which kills germs and bonds with the skin to continue killing germs even after washing. Please DO NOT use if you have an allergy to CHG or antibacterial soaps.  If your skin becomes reddened/irritated stop using the CHG and inform your nurse when you arrive at Short Stay. Do not shave (including legs and underarms) for at least 48 hours prior to the first CHG shower.  You may shave your face/neck. Please follow these instructions carefully:  1.  Shower with CHG Soap the night before surgery and the  morning of Surgery.  2.  If you choose to wash your hair, wash your hair first as usual with your  normal  shampoo.  3.  After you shampoo, rinse your hair and body thoroughly to remove the  shampoo.                           4.  Use CHG as you would any other liquid soap.  You can apply chg directly  to the skin and wash                       Gently with a scrungie or clean washcloth.  5.  Apply the CHG Soap to your body ONLY FROM THE NECK DOWN.   Do not use on face/ open                           Wound or open sores. Avoid contact with eyes, ears mouth and genitals (private parts).                       Wash face,  Genitals (private parts) with your normal soap.             6.  Wash thoroughly, paying special attention to the area where your surgery  will be performed.  7.  Thoroughly rinse your body with warm water from the neck down.  8.  DO NOT shower/wash with your normal soap after using and rinsing off  the CHG Soap.                9.  Pat yourself dry with a clean towel.            10.  Wear clean pajamas.            11.  Place clean sheets on your bed the night of your first shower and do not  sleep with pets. Day of Surgery : Do not apply any lotions/deodorants the morning of surgery.  Please wear clean clothes to the hospital/surgery center.  FAILURE TO FOLLOW THESE  INSTRUCTIONS MAY RESULT IN THE CANCELLATION OF YOUR SURGERY PATIENT SIGNATURE_________________________________  NURSE SIGNATURE__________________________________  ________________________________________________________________________

## 2019-10-15 NOTE — Progress Notes (Signed)
PCP - Josetta Huddle, MD Cardiologist -   Chest x-ray -  EKG -  Stress Test -  ECHO -  Cardiac Cath -   Sleep Study -  CPAP -   Fasting Blood Sugar -  Checks Blood Sugar _____ times a day  Blood Thinner Instructions: Aspirin Instructions: Last Dose:  Anesthesia review:   Patient denies shortness of breath, fever, cough and chest pain at PAT appointment   Patient verbalized understanding of instructions that were given to them at the PAT appointment. Patient was also instructed that they will need to review over the PAT instructions again at home before surgery.

## 2019-10-19 ENCOUNTER — Other Ambulatory Visit: Payer: Self-pay

## 2019-10-19 ENCOUNTER — Encounter (HOSPITAL_COMMUNITY)
Admission: RE | Admit: 2019-10-19 | Discharge: 2019-10-19 | Disposition: A | Discharge status: Home / Self Care | Payer: Medicare Other | Source: Ambulatory Visit | Attending: Obstetrics and Gynecology | Admitting: Obstetrics and Gynecology

## 2019-10-19 ENCOUNTER — Encounter (HOSPITAL_COMMUNITY): Payer: Self-pay

## 2019-10-19 DIAGNOSIS — N819 Female genital prolapse, unspecified: Secondary | ICD-10-CM | POA: Diagnosis not present

## 2019-10-19 DIAGNOSIS — Z01818 Encounter for other preprocedural examination: Secondary | ICD-10-CM | POA: Insufficient documentation

## 2019-10-19 DIAGNOSIS — I10 Essential (primary) hypertension: Secondary | ICD-10-CM | POA: Insufficient documentation

## 2019-10-19 HISTORY — DX: Other complications of anesthesia, initial encounter: T88.59XA

## 2019-10-19 HISTORY — DX: Other specified postprocedural states: Z98.890

## 2019-10-19 HISTORY — DX: Other specified postprocedural states: R11.2

## 2019-10-19 LAB — CBC
HCT: 43.5 % (ref 36.0–46.0)
Hemoglobin: 15 g/dL (ref 12.0–15.0)
MCH: 30 pg (ref 26.0–34.0)
MCHC: 34.5 g/dL (ref 30.0–36.0)
MCV: 87 fL (ref 80.0–100.0)
Platelets: 253 10*3/uL (ref 150–400)
RBC: 5 MIL/uL (ref 3.87–5.11)
RDW: 12.5 % (ref 11.5–15.5)
WBC: 7.2 10*3/uL (ref 4.0–10.5)
nRBC: 0 % (ref 0.0–0.2)

## 2019-10-19 LAB — BASIC METABOLIC PANEL
Anion gap: 12 (ref 5–15)
BUN: 14 mg/dL (ref 8–23)
CO2: 27 mmol/L (ref 22–32)
Calcium: 9.4 mg/dL (ref 8.9–10.3)
Chloride: 101 mmol/L (ref 98–111)
Creatinine, Ser: 0.61 mg/dL (ref 0.44–1.00)
GFR calc Af Amer: >60 mL/min (ref >60)
GFR calc non Af Amer: >60 mL/min (ref >60)
Glucose, Bld: 144 mg/dL — ABNORMAL HIGH (ref 70–99)
Potassium: 3.4 mmol/L — ABNORMAL LOW (ref 3.5–5.1)
Sodium: 140 mmol/L (ref 135–145)

## 2019-10-19 LAB — ABO/RH: ABO/RH(D): O POS

## 2019-10-22 ENCOUNTER — Other Ambulatory Visit (HOSPITAL_COMMUNITY)
Admission: RE | Admit: 2019-10-22 | Discharge: 2019-10-22 | Disposition: A | Discharge status: Home / Self Care | Payer: Medicare Other | Source: Ambulatory Visit | Attending: Obstetrics and Gynecology | Admitting: Obstetrics and Gynecology

## 2019-10-22 DIAGNOSIS — Z01812 Encounter for preprocedural laboratory examination: Secondary | ICD-10-CM | POA: Diagnosis not present

## 2019-10-22 DIAGNOSIS — Z20822 Contact with and (suspected) exposure to covid-19: Secondary | ICD-10-CM | POA: Diagnosis not present

## 2019-10-22 LAB — SARS CORONAVIRUS 2 (TAT 6-24 HRS): SARS Coronavirus 2: NEGATIVE

## 2019-10-26 ENCOUNTER — Encounter (HOSPITAL_BASED_OUTPATIENT_CLINIC_OR_DEPARTMENT_OTHER): Payer: Self-pay | Admitting: Obstetrics and Gynecology

## 2019-10-26 ENCOUNTER — Encounter (HOSPITAL_BASED_OUTPATIENT_CLINIC_OR_DEPARTMENT_OTHER)
Admission: RE | Disposition: A | Discharge status: Home / Self Care | Payer: Self-pay | Source: Ambulatory Visit | Attending: Obstetrics and Gynecology

## 2019-10-26 ENCOUNTER — Observation Stay (HOSPITAL_BASED_OUTPATIENT_CLINIC_OR_DEPARTMENT_OTHER)
Admission: RE | Admit: 2019-10-26 | Discharge: 2019-10-27 | Disposition: A | Discharge status: Home / Self Care | Payer: Medicare Other | Source: Ambulatory Visit | Attending: Obstetrics and Gynecology | Admitting: Obstetrics and Gynecology

## 2019-10-26 ENCOUNTER — Observation Stay (HOSPITAL_BASED_OUTPATIENT_CLINIC_OR_DEPARTMENT_OTHER): Payer: Medicare Other | Admitting: Anesthesiology

## 2019-10-26 ENCOUNTER — Other Ambulatory Visit: Payer: Self-pay

## 2019-10-26 DIAGNOSIS — Z886 Allergy status to analgesic agent status: Secondary | ICD-10-CM | POA: Insufficient documentation

## 2019-10-26 DIAGNOSIS — N814 Uterovaginal prolapse, unspecified: Secondary | ICD-10-CM | POA: Diagnosis not present

## 2019-10-26 DIAGNOSIS — N816 Rectocele: Secondary | ICD-10-CM | POA: Diagnosis not present

## 2019-10-26 DIAGNOSIS — Z880 Allergy status to penicillin: Secondary | ICD-10-CM | POA: Diagnosis not present

## 2019-10-26 DIAGNOSIS — G4730 Sleep apnea, unspecified: Secondary | ICD-10-CM | POA: Diagnosis not present

## 2019-10-26 DIAGNOSIS — E039 Hypothyroidism, unspecified: Secondary | ICD-10-CM | POA: Insufficient documentation

## 2019-10-26 DIAGNOSIS — Z888 Allergy status to other drugs, medicaments and biological substances status: Secondary | ICD-10-CM | POA: Diagnosis not present

## 2019-10-26 DIAGNOSIS — Z79899 Other long term (current) drug therapy: Secondary | ICD-10-CM | POA: Diagnosis not present

## 2019-10-26 DIAGNOSIS — N819 Female genital prolapse, unspecified: Secondary | ICD-10-CM | POA: Diagnosis present

## 2019-10-26 DIAGNOSIS — M1990 Unspecified osteoarthritis, unspecified site: Secondary | ICD-10-CM | POA: Insufficient documentation

## 2019-10-26 DIAGNOSIS — Z79890 Hormone replacement therapy (postmenopausal): Secondary | ICD-10-CM | POA: Diagnosis not present

## 2019-10-26 DIAGNOSIS — I10 Essential (primary) hypertension: Secondary | ICD-10-CM | POA: Insufficient documentation

## 2019-10-26 DIAGNOSIS — N8110 Cystocele, unspecified: Secondary | ICD-10-CM | POA: Diagnosis not present

## 2019-10-26 HISTORY — PX: LAPAROSCOPIC VAGINAL HYSTERECTOMY WITH SALPINGECTOMY: SHX6680

## 2019-10-26 HISTORY — PX: ANTERIOR AND POSTERIOR REPAIR: SHX5121

## 2019-10-26 LAB — TYPE AND SCREEN
ABO/RH(D): O POS
Antibody Screen: NEGATIVE

## 2019-10-26 SURGERY — HYSTERECTOMY, VAGINAL, LAPAROSCOPY-ASSISTED, WITH SALPINGECTOMY
Anesthesia: General | Site: Vagina

## 2019-10-26 MED ORDER — LIDOCAINE 2% (20 MG/ML) 5 ML SYRINGE
INTRAMUSCULAR | Status: DC | PRN
  Administered 2019-10-26: 60 mg via INTRAVENOUS

## 2019-10-26 MED ORDER — SCOPOLAMINE 1 MG/3DAYS TD PT72
1.0000 | MEDICATED_PATCH | TRANSDERMAL | Status: DC
  Administered 2019-10-26: 1.5 mg via TRANSDERMAL
  Filled 2019-10-26: qty 1

## 2019-10-26 MED ORDER — SODIUM CHLORIDE (PF) 0.9 % IJ SOLN
INTRAMUSCULAR | Status: DC | PRN
  Administered 2019-10-26: 10 mL

## 2019-10-26 MED ORDER — ESTRADIOL 0.1 MG/GM VA CREA
TOPICAL_CREAM | VAGINAL | Status: DC | PRN
  Administered 2019-10-26: 1 via VAGINAL

## 2019-10-26 MED ORDER — FENTANYL CITRATE (PF) 250 MCG/5ML IJ SOLN
INTRAMUSCULAR | Status: AC
  Filled 2019-10-26: qty 5

## 2019-10-26 MED ORDER — PROMETHAZINE HCL 25 MG/ML IJ SOLN
6.2500 mg | INTRAMUSCULAR | Status: DC | PRN
  Filled 2019-10-26: qty 1

## 2019-10-26 MED ORDER — DEXAMETHASONE SODIUM PHOSPHATE 10 MG/ML IJ SOLN
INTRAMUSCULAR | Status: DC | PRN
  Administered 2019-10-26: 10 mg via INTRAVENOUS

## 2019-10-26 MED ORDER — OXYCODONE HCL 5 MG PO TABS
ORAL_TABLET | ORAL | Status: AC
  Filled 2019-10-26: qty 1

## 2019-10-26 MED ORDER — ONDANSETRON HCL 4 MG/2ML IJ SOLN
INTRAMUSCULAR | Status: AC
  Filled 2019-10-26: qty 2

## 2019-10-26 MED ORDER — LEVOTHYROXINE SODIUM 50 MCG PO TABS
50.0000 ug | ORAL_TABLET | Freq: Every day | ORAL | Status: DC
  Administered 2019-10-27: 05:00:00 50 ug via ORAL
  Filled 2019-10-26: qty 1

## 2019-10-26 MED ORDER — FENTANYL CITRATE (PF) 100 MCG/2ML IJ SOLN
INTRAMUSCULAR | Status: DC | PRN
  Administered 2019-10-26 (×2): 100 ug via INTRAVENOUS

## 2019-10-26 MED ORDER — CLINDAMYCIN PHOSPHATE 900 MG/50ML IV SOLN
INTRAVENOUS | Status: AC
  Filled 2019-10-26: qty 50

## 2019-10-26 MED ORDER — PROPOFOL 10 MG/ML IV BOLUS
INTRAVENOUS | Status: AC
  Filled 2019-10-26: qty 40

## 2019-10-26 MED ORDER — LIDOCAINE 2% (20 MG/ML) 5 ML SYRINGE
INTRAMUSCULAR | Status: AC
  Filled 2019-10-26: qty 5

## 2019-10-26 MED ORDER — OXYCODONE HCL 5 MG PO TABS
5.0000 mg | ORAL_TABLET | ORAL | Status: DC | PRN
  Administered 2019-10-26 – 2019-10-27 (×3): 5 mg via ORAL
  Filled 2019-10-26: qty 2

## 2019-10-26 MED ORDER — DEXAMETHASONE SODIUM PHOSPHATE 10 MG/ML IJ SOLN
INTRAMUSCULAR | Status: AC
  Filled 2019-10-26: qty 1

## 2019-10-26 MED ORDER — FENTANYL CITRATE (PF) 100 MCG/2ML IJ SOLN
INTRAMUSCULAR | Status: AC
  Filled 2019-10-26: qty 2

## 2019-10-26 MED ORDER — AMLODIPINE BESYLATE 5 MG PO TABS
5.0000 mg | ORAL_TABLET | Freq: Every day | ORAL | Status: DC
  Administered 2019-10-26: 5 mg via ORAL
  Filled 2019-10-26: qty 1

## 2019-10-26 MED ORDER — METOPROLOL SUCCINATE ER 25 MG PO TB24
25.0000 mg | ORAL_TABLET | Freq: Every day | ORAL | Status: DC
  Filled 2019-10-26: qty 1

## 2019-10-26 MED ORDER — GENTAMICIN SULFATE 40 MG/ML IJ SOLN
340.0000 mg | INTRAVENOUS | Status: AC
  Administered 2019-10-26: 340 mg via INTRAVENOUS
  Filled 2019-10-26: qty 8.5

## 2019-10-26 MED ORDER — GABAPENTIN 100 MG PO CAPS
100.0000 mg | ORAL_CAPSULE | Freq: Two times a day (BID) | ORAL | Status: DC
  Administered 2019-10-26 (×2): 100 mg via ORAL
  Filled 2019-10-26: qty 1

## 2019-10-26 MED ORDER — MENTHOL 3 MG MT LOZG
1.0000 | LOZENGE | OROMUCOSAL | Status: DC | PRN
  Filled 2019-10-26: qty 9

## 2019-10-26 MED ORDER — GABAPENTIN 100 MG PO CAPS
ORAL_CAPSULE | ORAL | Status: AC
  Filled 2019-10-26: qty 1

## 2019-10-26 MED ORDER — DEXTROSE IN LACTATED RINGERS 5 % IV SOLN
INTRAVENOUS | Status: DC
  Administered 2019-10-26: 12:00:00 125 mL/h via INTRAVENOUS
  Filled 2019-10-26 (×2): qty 1000

## 2019-10-26 MED ORDER — ONDANSETRON HCL 4 MG/2ML IJ SOLN
INTRAMUSCULAR | Status: DC | PRN
  Administered 2019-10-26: 4 mg via INTRAVENOUS

## 2019-10-26 MED ORDER — ONDANSETRON HCL 4 MG/2ML IJ SOLN
4.0000 mg | Freq: Four times a day (QID) | INTRAMUSCULAR | Status: DC | PRN
  Filled 2019-10-26: qty 2

## 2019-10-26 MED ORDER — ROCURONIUM BROMIDE 10 MG/ML (PF) SYRINGE
PREFILLED_SYRINGE | INTRAVENOUS | Status: DC | PRN
  Administered 2019-10-26: 40 mg via INTRAVENOUS
  Administered 2019-10-26: 10 mg via INTRAVENOUS

## 2019-10-26 MED ORDER — ZOLPIDEM TARTRATE 5 MG PO TABS
5.0000 mg | ORAL_TABLET | Freq: Every evening | ORAL | Status: DC | PRN
  Filled 2019-10-26: qty 1

## 2019-10-26 MED ORDER — ROCURONIUM BROMIDE 10 MG/ML (PF) SYRINGE
PREFILLED_SYRINGE | INTRAVENOUS | Status: AC
  Filled 2019-10-26: qty 10

## 2019-10-26 MED ORDER — KETOROLAC TROMETHAMINE 30 MG/ML IJ SOLN
INTRAMUSCULAR | Status: DC | PRN
  Administered 2019-10-26: 30 mg via INTRAVENOUS

## 2019-10-26 MED ORDER — SORBITOL 70 % SOLN
30.0000 mL | Freq: Every day | Status: DC | PRN
  Filled 2019-10-26: qty 30

## 2019-10-26 MED ORDER — ARTIFICIAL TEARS OPHTHALMIC OINT
TOPICAL_OINTMENT | OPHTHALMIC | Status: AC
  Filled 2019-10-26: qty 3.5

## 2019-10-26 MED ORDER — LACTATED RINGERS IV SOLN
INTRAVENOUS | Status: DC
  Administered 2019-10-26: 06:00:00 1000 mL via INTRAVENOUS
  Filled 2019-10-26: qty 1000

## 2019-10-26 MED ORDER — SUGAMMADEX SODIUM 200 MG/2ML IV SOLN
INTRAVENOUS | Status: DC | PRN
  Administered 2019-10-26: 200 mg via INTRAVENOUS

## 2019-10-26 MED ORDER — SODIUM CHLORIDE 0.9 % IR SOLN
Status: DC | PRN
  Administered 2019-10-26: 3000 mL

## 2019-10-26 MED ORDER — KETAMINE HCL 10 MG/ML IJ SOLN
INTRAMUSCULAR | Status: AC
  Filled 2019-10-26: qty 1

## 2019-10-26 MED ORDER — LIDOCAINE HCL 2 % IJ SOLN
INTRAMUSCULAR | Status: AC
  Filled 2019-10-26: qty 20

## 2019-10-26 MED ORDER — ACETAMINOPHEN 500 MG PO TABS
1000.0000 mg | ORAL_TABLET | Freq: Once | ORAL | Status: AC
  Administered 2019-10-26: 07:00:00 1000 mg via ORAL
  Filled 2019-10-26: qty 2

## 2019-10-26 MED ORDER — SCOPOLAMINE 1 MG/3DAYS TD PT72
MEDICATED_PATCH | TRANSDERMAL | Status: AC
  Filled 2019-10-26: qty 1

## 2019-10-26 MED ORDER — LIDOCAINE-EPINEPHRINE 1 %-1:100000 IJ SOLN
INTRAMUSCULAR | Status: DC | PRN
  Administered 2019-10-26: 10 mL

## 2019-10-26 MED ORDER — ONDANSETRON HCL 4 MG PO TABS
4.0000 mg | ORAL_TABLET | Freq: Four times a day (QID) | ORAL | Status: DC | PRN
  Filled 2019-10-26: qty 1

## 2019-10-26 MED ORDER — ACETAMINOPHEN 500 MG PO TABS
ORAL_TABLET | ORAL | Status: AC
  Filled 2019-10-26: qty 2

## 2019-10-26 MED ORDER — KETAMINE HCL 10 MG/ML IJ SOLN
INTRAMUSCULAR | Status: DC | PRN
  Administered 2019-10-26: 20 mg via INTRAVENOUS

## 2019-10-26 MED ORDER — BUPIVACAINE HCL (PF) 0.25 % IJ SOLN
INTRAMUSCULAR | Status: DC | PRN
  Administered 2019-10-26: 10 mL

## 2019-10-26 MED ORDER — SIMETHICONE 80 MG PO CHEW
80.0000 mg | CHEWABLE_TABLET | Freq: Four times a day (QID) | ORAL | Status: DC | PRN
  Filled 2019-10-26: qty 1

## 2019-10-26 MED ORDER — KETOROLAC TROMETHAMINE 30 MG/ML IJ SOLN
INTRAMUSCULAR | Status: AC
  Filled 2019-10-26: qty 1

## 2019-10-26 MED ORDER — CLINDAMYCIN PHOSPHATE 900 MG/50ML IV SOLN
900.0000 mg | INTRAVENOUS | Status: AC
  Administered 2019-10-26: 07:00:00 900 mg via INTRAVENOUS
  Filled 2019-10-26: qty 50

## 2019-10-26 MED ORDER — PROPOFOL 10 MG/ML IV BOLUS
INTRAVENOUS | Status: DC | PRN
  Administered 2019-10-26: 120 mg via INTRAVENOUS

## 2019-10-26 MED ORDER — FENTANYL CITRATE (PF) 100 MCG/2ML IJ SOLN
25.0000 ug | INTRAMUSCULAR | Status: DC | PRN
  Administered 2019-10-26: 10:00:00 25 ug via INTRAVENOUS
  Filled 2019-10-26: qty 1

## 2019-10-26 MED ORDER — TRIAMTERENE-HCTZ 37.5-25 MG PO TABS
1.0000 | ORAL_TABLET | Freq: Every day | ORAL | Status: DC
  Administered 2019-10-26: 1 via ORAL
  Filled 2019-10-26: qty 1

## 2019-10-26 MED ORDER — PROMETHAZINE HCL 25 MG/ML IJ SOLN
INTRAMUSCULAR | Status: AC
  Filled 2019-10-26: qty 1

## 2019-10-26 SURGICAL SUPPLY — 60 items
ADH SKN CLS APL DERMABOND .7 (GAUZE/BANDAGES/DRESSINGS) ×2
BLADE SURG 15 STRL LF DISP TIS (BLADE) ×2 IMPLANT
BLADE SURG 15 STRL SS (BLADE) ×3
CABLE HIGH FREQUENCY MONO STRZ (ELECTRODE) IMPLANT
CANISTER SUCT 3000ML PPV (MISCELLANEOUS) ×3 IMPLANT
CATH ROBINSON RED A/P 16FR (CATHETERS) ×3 IMPLANT
COVER BACK TABLE 60X90IN (DRAPES) ×3 IMPLANT
COVER MAYO STAND STRL (DRAPES) ×6 IMPLANT
COVER SURGICAL LIGHT HANDLE (MISCELLANEOUS) ×3 IMPLANT
COVER WAND RF STERILE (DRAPES) ×3 IMPLANT
DECANTER SPIKE VIAL GLASS SM (MISCELLANEOUS) IMPLANT
DERMABOND ADVANCED (GAUZE/BANDAGES/DRESSINGS) ×1
DERMABOND ADVANCED .7 DNX12 (GAUZE/BANDAGES/DRESSINGS) ×2 IMPLANT
DRSG OPSITE POSTOP 3X4 (GAUZE/BANDAGES/DRESSINGS) ×1 IMPLANT
DURAPREP 26ML APPLICATOR (WOUND CARE) ×3 IMPLANT
ELECT REM PT RETURN 9FT ADLT (ELECTROSURGICAL) ×3
ELECTRODE REM PT RTRN 9FT ADLT (ELECTROSURGICAL) ×2 IMPLANT
GAUZE 4X4 16PLY RFD (DISPOSABLE) ×4 IMPLANT
GAUZE PACKING 1 X5 YD ST (GAUZE/BANDAGES/DRESSINGS) ×1 IMPLANT
GLOVE BIO SURGEON STRL SZ7 (GLOVE) ×8 IMPLANT
GLOVE ECLIPSE 6.5 STRL STRAW (GLOVE) ×4 IMPLANT
GOWN STRL REUS W/TWL LRG LVL3 (GOWN DISPOSABLE) ×10 IMPLANT
HOLDER FOLEY CATH W/STRAP (MISCELLANEOUS) ×3 IMPLANT
IV NS IRRIG 3000ML ARTHROMATIC (IV SOLUTION) ×1 IMPLANT
LIGASURE IMPACT 36 18CM CVD LR (INSTRUMENTS) IMPLANT
NEEDLE HYPO 22GX1.5 SAFETY (NEEDLE) ×3 IMPLANT
NEEDLE INSUFFLATION 120MM (ENDOMECHANICALS) ×3 IMPLANT
NS IRRIG 1000ML POUR BTL (IV SOLUTION) ×3 IMPLANT
NS IRRIG 500ML POUR BTL (IV SOLUTION) ×3 IMPLANT
PACK LAVH (CUSTOM PROCEDURE TRAY) ×3 IMPLANT
PACK ROBOTIC GOWN (GOWN DISPOSABLE) ×3 IMPLANT
PACK TRENDGUARD 450 HYBRID PRO (MISCELLANEOUS) ×2 IMPLANT
PACK VAGINAL WOMENS (CUSTOM PROCEDURE TRAY) ×3 IMPLANT
PROTECTOR NERVE ULNAR (MISCELLANEOUS) ×6 IMPLANT
SEALER TISSUE G2 CVD JAW 45CM (ENDOMECHANICALS) ×1 IMPLANT
SET IRRIG TUBING LAPAROSCOPIC (IRRIGATION / IRRIGATOR) IMPLANT
SET IRRIG Y TYPE TUR BLADDER L (SET/KITS/TRAYS/PACK) IMPLANT
SET SUCTION IRRIG HYDROSURG (IRRIGATION / IRRIGATOR) ×1 IMPLANT
SET TUBE SMOKE EVAC HIGH FLOW (TUBING) ×3 IMPLANT
SUT MON AB 2-0 CT1 36 (SUTURE) IMPLANT
SUT VIC AB 0 CT1 18XCR BRD8 (SUTURE) ×4 IMPLANT
SUT VIC AB 0 CT1 36 (SUTURE) ×4 IMPLANT
SUT VIC AB 0 CT1 8-18 (SUTURE) ×9
SUT VIC AB 2-0 CT1 (SUTURE) ×3 IMPLANT
SUT VIC AB 2-0 SH 27 (SUTURE)
SUT VIC AB 2-0 SH 27XBRD (SUTURE) IMPLANT
SUT VIC AB 2-0 UR6 27 (SUTURE) ×8 IMPLANT
SUT VICRYL 0 TIES 12 18 (SUTURE) ×2 IMPLANT
SUT VICRYL 4-0 PS2 18IN ABS (SUTURE) ×3 IMPLANT
SUT VICRYL RAPIDE 3-0 36IN (SUTURE) ×4 IMPLANT
TOWEL OR 17X26 10 PK STRL BLUE (TOWEL DISPOSABLE) ×3 IMPLANT
TRAY FOLEY W/BAG SLVR 14FR (SET/KITS/TRAYS/PACK) ×3 IMPLANT
TRENDGUARD 450 HYBRID PRO PACK (MISCELLANEOUS) ×3
TROCAR BLADELESS OPT 5 100 (ENDOMECHANICALS) IMPLANT
TROCAR OPTI TIP 5M 100M (ENDOMECHANICALS) ×3 IMPLANT
TROCAR XCEL DIL TIP R 11M (ENDOMECHANICALS) ×3 IMPLANT
TUBE CONNECTING 12X1/4 (SUCTIONS) ×1 IMPLANT
WARMER LAPAROSCOPE (MISCELLANEOUS) ×3 IMPLANT
WATER STERILE IRR 500ML POUR (IV SOLUTION) IMPLANT
YANKAUER SUCT BULB TIP NO VENT (SUCTIONS) ×1 IMPLANT

## 2019-10-26 NOTE — Progress Notes (Signed)
Alert and conversant adeq UOP>>.clear  BP (!) 118/53 (BP Location: Right Arm)   Pulse 64   Temp 98 F (36.7 C)   Resp 18   Ht 5\' 4"  (1.626 m)   Wt 66.7 kg   SpO2 97%   BMI 25.25 kg/m   Doing weel>>>up to ambulate, will D/C cath and vag pack in am

## 2019-10-26 NOTE — Progress Notes (Signed)
The patient was re-examined with no change in status 

## 2019-10-26 NOTE — Anesthesia Preprocedure Evaluation (Addendum)
Anesthesia Evaluation  Patient identified by MRN, date of birth, ID band Patient awake    Reviewed: Allergy & Precautions, NPO status , Patient's Chart, lab work & pertinent test results, reviewed documented beta blocker date and time   History of Anesthesia Complications (+) PONV  Airway Mallampati: II  TM Distance: >3 FB Neck ROM: Full    Dental  (+) Dental Advisory Given, Teeth Intact,    Pulmonary neg pulmonary ROS,    breath sounds clear to auscultation       Cardiovascular hypertension, Pt. on medications and Pt. on home beta blockers  Rhythm:Regular Rate:Normal     Neuro/Psych negative neurological ROS     GI/Hepatic negative GI ROS, Neg liver ROS,   Endo/Other  Hypothyroidism   Renal/GU negative Renal ROS     Musculoskeletal  (+) Arthritis ,   Abdominal   Peds  Hematology negative hematology ROS (+)   Anesthesia Other Findings   Reproductive/Obstetrics                            Lab Results  Component Value Date   WBC 7.2 10/19/2019   HGB 15.0 10/19/2019   HCT 43.5 10/19/2019   MCV 87.0 10/19/2019   PLT 253 10/19/2019   Lab Results  Component Value Date   CREATININE 0.61 10/19/2019   BUN 14 10/19/2019   NA 140 10/19/2019   K 3.4 (L) 10/19/2019   CL 101 10/19/2019   CO2 27 10/19/2019    Anesthesia Physical Anesthesia Plan  ASA: II  Anesthesia Plan: General   Post-op Pain Management:    Induction: Intravenous  PONV Risk Score and Plan: 4 or greater and Dexamethasone, Ondansetron, Treatment may vary due to age or medical condition and Scopolamine patch - Pre-op  Airway Management Planned: Oral ETT  Additional Equipment:   Intra-op Plan:   Post-operative Plan: Extubation in OR  Informed Consent: I have reviewed the patients History and Physical, chart, labs and discussed the procedure including the risks, benefits and alternatives for the proposed  anesthesia with the patient or authorized representative who has indicated his/her understanding and acceptance.     Dental advisory given  Plan Discussed with: CRNA  Anesthesia Plan Comments:         Anesthesia Quick Evaluation

## 2019-10-26 NOTE — Op Note (Signed)
Preoperative diagnosis: Symptomatic uterine prolapse, cystocele and rectocele  Postoperative diagnosis: Same  Procedure: LAVH, bilateral salpingectomy, anterior and posterior repair, McCall's culdoplasty  Surgeon: Matthew Saras  Assistant: [place]  EBL: 100 cc  Procedure and findings  The patient was taken to the operating room after an adequate level of general anesthesia was obtained with the patient's legs in stirrups the abdomen perineum and vagina were prepped and draped the bladder was drained, EUA was carried out the uterus was small, mobile mild prolapse noted along with moderate cystocele and small rectocele was noted previously.  After appropriate timeouts were taken, Hulka tenaculum was position.  Attention directed to the abdomen the subumbilical area was infiltrated with quarter percent Marcaine plain, small incision was made and the varies needle was introduced without difficulty.  There was no evidence of any bleeding or trauma.  The operative trocar was then inserted without difficulty.  3 fingerbreadths above the symphysis of the midline a 5 mm trocar was inserted patient placed in Trendelenburg and pelvic findings as follows:  Uterus was small, mobile she had a prior Falope ring tubal adnexa and cul-de-sac unremarkable upper abdomen negative.  Atraumatic grasper was then used to grasp the right tube which was placed on traction, the Enseal device then used to coagulate and divide the tube down to and including the round ligament with excellent hemostasis.  The exact same repeated on the opposite side.  Both ovaries were conserved as they look normal and that was the patient's request.  Once this was completed the vaginal portion of the procedure started  Legs were extended, weighted speculum was positioned cervix grasped with a tenaculum was circumscribed with the Bovie, posterior culdotomy performed without difficulty, the bladder was advanced anteriorly until the reflection could  be noted this was entered with sharply without difficulty retracted then used to gently elevate the bladder out of the field.  In sequential manner, staying close to the uterus the uterosacral ligament, cardinal ligament upper broad ligament pedicles were clamped divided and suture-ligated with 0 Vicryl.  The fundus was then delivered posteriorly remaining pedicles clamped cut and tied with 0 Vicryl suture.  McCall's culdoplasty was then performed picking up left uterosacral ligament, peritoneum in between and the right uterosacral ligament and tied in the midline, uterosacral ligaments were also tied in the midline for extra support.  The cuff was closed with interrupted 2-0 Vicryl sutures.  This was left open temporarily until the anterior.  Repair could be done.  Midline incision was made after infiltrating some dilute saline and Xylocaine, this was made up to the UV angle, using sharp and blunt dissection the perivesical fascia was dissected free, the perivesical fascia was then plicated in the midline with 2-0 Vicryl interrupted sutures.  Excess vaginal mucosa was trimmed and then reapproximated with 2-0 Vicryl sutures with good hemostasis.  This point the remainder of the cuff was closed in a right to left technique with 0 Vicryl interrupted sutures.  This was hemostatic.  Prior to closure sponge, needle, instrument counts reported as correct x2.  Small triangle of posterior perineal skin wheals was excised after again infiltrating with local and saline, posterior vaginal mucosal incision was made up to the two thirds point and then the perirectal fascia was dissected with sharp and blunt dissection.  Once the rectocele was reduced, 2-0 Vicryl interrupted sutures used to reapproximate the perirectal fascia in the midline.  A small amount of vaginal mucosa was excised and then the vaginal mucosa reapproximated with interrupted  2-0 Vicryl sutures.  3-0 Vicryl repeat on the perineum and external skin.  The  operative site was hemostatic at that point Foley catheter was position draining clear urine.  An Estrace-soaked vaginal pack was then placed,  Attention directed to the laparoscopy Nasop was used to irrigate the pelvis pressure reduced and the operative site was inspected all noted to be hemostatic, instruments were removed, gas allowed to escape and the upper incision closed with a 4-0 Monocryl subcuticular, Dermabond on the lower.  She tolerated this well went to recovery room in good condition.  Dictated with Dragon Medical 1  Margarette Asal MD

## 2019-10-26 NOTE — Anesthesia Procedure Notes (Signed)
Procedure Name: Intubation Date/Time: 10/26/2019 7:38 AM Performed by: Wanita Chamberlain, CRNA Pre-anesthesia Checklist: Patient identified, Emergency Drugs available, Suction available and Patient being monitored Patient Re-evaluated:Patient Re-evaluated prior to induction Oxygen Delivery Method: Circle system utilized Preoxygenation: Pre-oxygenation with 100% oxygen Induction Type: IV induction Ventilation: Mask ventilation without difficulty Laryngoscope Size: Mac and 3 Grade View: Grade II Tube type: Oral Tube size: 7.0 mm Number of attempts: 1 Airway Equipment and Method: Stylet Placement Confirmation: breath sounds checked- equal and bilateral,  CO2 detector,  positive ETCO2 and ETT inserted through vocal cords under direct vision Secured at: 21 cm Dental Injury: Teeth and Oropharynx as per pre-operative assessment

## 2019-10-26 NOTE — Transfer of Care (Signed)
Immediate Anesthesia Transfer of Care Note  Patient: Angelica Levine  Procedure(s) Performed: LAPAROSCOPIC ASSISTED VAGINAL HYSTERECTOMY WITH BILATERAL SALPINGECTOMY (Bilateral Abdomen) ANTERIOR (CYSTOCELE) AND POSTERIOR REPAIR (RECTOCELE) (N/A Vagina )  Patient Location: PACU  Anesthesia Type:General  Level of Consciousness: awake, alert , oriented and patient cooperative  Airway & Oxygen Therapy: Patient Spontanous Breathing and Patient connected to nasal cannula oxygen  Post-op Assessment: Report given to RN and Post -op Vital signs reviewed and stable  Post vital signs: Reviewed and stable  Last Vitals:  Vitals Value Taken Time  BP 95/46 10/26/19 0930  Temp    Pulse 56 10/26/19 0930  Resp 11 10/26/19 0930  SpO2 98 % 10/26/19 0930  Vitals shown include unvalidated device data.  Last Pain:  Vitals:   10/26/19 0614  TempSrc: Oral  PainSc: 0-No pain      Patients Stated Pain Goal: 4 ([id number])  Complications: No apparent anesthesia complications

## 2019-10-26 NOTE — Anesthesia Postprocedure Evaluation (Signed)
Anesthesia Post Note  Patient: Acupuncturist  Procedure(s) Performed: LAPAROSCOPIC ASSISTED VAGINAL HYSTERECTOMY WITH BILATERAL SALPINGECTOMY (Bilateral Abdomen) ANTERIOR (CYSTOCELE) AND POSTERIOR REPAIR (RECTOCELE) (N/A Vagina )     Patient location during evaluation: PACU Anesthesia Type: General Level of consciousness: awake and alert Pain management: pain level controlled Vital Signs Assessment: post-procedure vital signs reviewed and stable Respiratory status: spontaneous breathing, nonlabored ventilation, respiratory function stable and patient connected to nasal cannula oxygen Cardiovascular status: blood pressure returned to baseline and stable Postop Assessment: no apparent nausea or vomiting Anesthetic complications: no    Last Vitals:  Vitals:   10/26/19 1114 10/26/19 1210  BP: (!) 118/53   Pulse: 63 64  Resp: 18 18  Temp: 36.7 C 36.7 C  SpO2: 96% 97%    Last Pain:  Vitals:   10/26/19 1228  TempSrc:   PainSc: 5                  Tiajuana Amass

## 2019-10-27 DIAGNOSIS — N814 Uterovaginal prolapse, unspecified: Secondary | ICD-10-CM | POA: Diagnosis not present

## 2019-10-27 DIAGNOSIS — I10 Essential (primary) hypertension: Secondary | ICD-10-CM | POA: Diagnosis not present

## 2019-10-27 DIAGNOSIS — Z79890 Hormone replacement therapy (postmenopausal): Secondary | ICD-10-CM | POA: Diagnosis not present

## 2019-10-27 DIAGNOSIS — M1990 Unspecified osteoarthritis, unspecified site: Secondary | ICD-10-CM | POA: Diagnosis not present

## 2019-10-27 DIAGNOSIS — Z79899 Other long term (current) drug therapy: Secondary | ICD-10-CM | POA: Diagnosis not present

## 2019-10-27 DIAGNOSIS — E039 Hypothyroidism, unspecified: Secondary | ICD-10-CM | POA: Diagnosis not present

## 2019-10-27 LAB — SURGICAL PATHOLOGY

## 2019-10-27 LAB — CBC
HCT: 36 % (ref 36.0–46.0)
Hemoglobin: 12.3 g/dL (ref 12.0–15.0)
MCH: 30.4 pg (ref 26.0–34.0)
MCHC: 34.2 g/dL (ref 30.0–36.0)
MCV: 88.9 fL (ref 80.0–100.0)
Platelets: 197 10*3/uL (ref 150–400)
RBC: 4.05 MIL/uL (ref 3.87–5.11)
RDW: 12.5 % (ref 11.5–15.5)
WBC: 14.4 10*3/uL — ABNORMAL HIGH (ref 4.0–10.5)
nRBC: 0 % (ref 0.0–0.2)

## 2019-10-27 MED ORDER — OXYCODONE HCL 5 MG PO TABS
ORAL_TABLET | ORAL | Status: AC
  Filled 2019-10-27: qty 1

## 2019-10-27 MED ORDER — OXYCODONE HCL 5 MG PO TABS: 5.0000 mg | ORAL_TABLET | ORAL | 0 refills | Status: AC | PRN

## 2019-10-27 NOTE — Progress Notes (Signed)
Pt up in hall- walked two laps.  Foley catheter and vaginal packing removed per order.  Pt tolerated procedures well.  Minimal vaginal bleeding noted.

## 2019-10-27 NOTE — Discharge Summary (Signed)
Physician Discharge Summary  Patient ID: Angelica Levine MRN: [id number] DOB/AGE: 71-May-1952 71 y.o.  Admit date: 10/26/2019 Discharge date: 10/27/2019  Admission Diagnoses:uterine prolapse, cystocele, rectocele  Discharge Diagnoses: same Active Problems:   Prolapse of female pelvic organs   Uterine prolapse   Discharged Condition: good  Hospital Course: adm for LAVH/BS A and P repair, OBSV overnight , cath and vag pack removed>>>voiding, tol PO and afeb  Consults: None  Significant Diagnostic Studies: labs:  Results for orders placed or performed during the hospital encounter of 10/26/19 (from the past 24 hour(s))  CBC     Status: Abnormal   Collection Time: 10/27/19  5:17 AM  Result Value Ref Range   WBC 14.4 (H) 4.0 - 10.5 K/uL   RBC 4.05 3.87 - 5.11 MIL/uL   Hemoglobin 12.3 12.0 - 15.0 g/dL   HCT 36.0 36.0 - 46.0 %   MCV 88.9 80.0 - 100.0 fL   MCH 30.4 26.0 - 34.0 pg   MCHC 34.2 30.0 - 36.0 g/dL   RDW 12.5 11.5 - 15.5 %   Platelets 197 150 - 400 K/uL   nRBC 0.0 0.0 - 0.2 %    Treatments: surgery: LAVH/BS w/ A and P repair  Discharge Exam: Blood pressure (!) 105/54, pulse 65, temperature 98.2 F (36.8 C), temperature source Axillary, resp. rate 16, height 5\' 4"  (1.626 m), weight 66.7 kg, SpO2 94 %. General appearance: alert GI: soft, non-tender; bowel sounds normal; no masses,  no organomegaly, incs C/D  Disposition: Discharge disposition: 01-Home or Self Care        Allergies as of 10/27/2019      Reactions   Losartan Shortness Of Breath   Shrimp [shellfish Allergy] Anaphylaxis   Asa [aspirin] Diarrhea   Stabbing pain in intestines   Eggs Or Egg-derived Products Other (See Comments)   Reaction unknown   Linaclotide    Linzess- increased heart rate   Lisinopril Cough   Penicillins Hives   Sudafed [pseudoephedrine Hcl] Hypertension      Medication List    STOP taking these medications   UNABLE TO FIND     TAKE these medications   amLODipine 5 MG  tablet Commonly known as: NORVASC Take 5 mg by mouth daily with supper.   Calcium Carbonate 500 MG Chew Chew 500 mg by mouth in the morning and at bedtime.   cetirizine 10 MG tablet Commonly known as: ZYRTEC Take 10 mg by mouth daily.   famotidine 40 MG tablet Commonly known as: PEPCID Take 1 tablet by mouth once daily in the evening   FINACEA EX Apply 1 application topically 2 (two) times daily.   levothyroxine 50 MCG tablet Commonly known as: SYNTHROID Take 1 tablet (50 mcg total) by mouth daily. What changed: when to take this   LORazepam 0.5 MG tablet Commonly known as: ATIVAN Take 1 tablet (0.5 mg total) by mouth at bedtime as needed for anxiety.   metoprolol succinate 25 MG 24 hr tablet Commonly known as: TOPROL-XL Take 25 mg by mouth daily.   oxyCODONE 5 MG immediate release tablet Commonly known as: Oxy IR/ROXICODONE Take 1-2 tablets (5-10 mg total) by mouth every 4 (four) hours as needed for moderate pain.   sodium chloride 0.65 % Soln nasal spray Commonly known as: OCEAN Place 1 spray into both nostrils as needed for congestion.   Systane 0.4-0.3 % Soln Generic drug: Polyethyl Glycol-Propyl Glycol Place 1 drop into both eyes 2 (two) times daily as needed (dry eyes).  triamterene-hydrochlorothiazide 37.5-25 MG tablet Commonly known as: MAXZIDE-25 Take 1 tablet by mouth every morning.   Vitamin D3 25 MCG (1000 UT) Caps Take 2,000 Units by mouth daily.      Follow-up Information    Molli Posey, MD. Schedule an appointment as soon as possible for a visit in 10 day(s).   Specialty: Obstetrics and Gynecology Contact information: [address] [place] [address] [phone]           Signed: Margarette Asal 10/27/2019, 8:19 AM

## 2019-10-27 NOTE — Discharge Instructions (Signed)
1. Colace BID-TID 2. Miralax QD as needed 3. Discussed warm H2O sitz>>Dermoplast as needed

## 2019-10-29 DIAGNOSIS — Z09 Encounter for follow-up examination after completed treatment for conditions other than malignant neoplasm: Secondary | ICD-10-CM | POA: Diagnosis not present

## 2019-10-29 DIAGNOSIS — G8918 Other acute postprocedural pain: Secondary | ICD-10-CM | POA: Diagnosis not present

## 2019-10-29 DIAGNOSIS — N390 Urinary tract infection, site not specified: Secondary | ICD-10-CM | POA: Diagnosis not present

## 2019-10-29 DIAGNOSIS — R319 Hematuria, unspecified: Secondary | ICD-10-CM | POA: Diagnosis not present

## 2019-12-21 DIAGNOSIS — L720 Epidermal cyst: Secondary | ICD-10-CM | POA: Diagnosis not present

## 2019-12-21 DIAGNOSIS — D225 Melanocytic nevi of trunk: Secondary | ICD-10-CM | POA: Diagnosis not present

## 2019-12-21 DIAGNOSIS — D224 Melanocytic nevi of scalp and neck: Secondary | ICD-10-CM | POA: Diagnosis not present

## 2019-12-21 DIAGNOSIS — L821 Other seborrheic keratosis: Secondary | ICD-10-CM | POA: Diagnosis not present

## 2020-01-19 DIAGNOSIS — M2560 Stiffness of unspecified joint, not elsewhere classified: Secondary | ICD-10-CM | POA: Diagnosis not present

## 2020-01-19 DIAGNOSIS — M545 Low back pain: Secondary | ICD-10-CM | POA: Diagnosis not present

## 2020-01-25 DIAGNOSIS — M2560 Stiffness of unspecified joint, not elsewhere classified: Secondary | ICD-10-CM | POA: Diagnosis not present

## 2020-01-25 DIAGNOSIS — M545 Low back pain: Secondary | ICD-10-CM | POA: Diagnosis not present

## 2020-02-08 DIAGNOSIS — M545 Low back pain: Secondary | ICD-10-CM | POA: Diagnosis not present

## 2020-02-08 DIAGNOSIS — M2560 Stiffness of unspecified joint, not elsewhere classified: Secondary | ICD-10-CM | POA: Diagnosis not present

## 2020-02-15 DIAGNOSIS — M545 Low back pain: Secondary | ICD-10-CM | POA: Diagnosis not present

## 2020-02-15 DIAGNOSIS — M2560 Stiffness of unspecified joint, not elsewhere classified: Secondary | ICD-10-CM | POA: Diagnosis not present

## 2020-02-22 DIAGNOSIS — M1712 Unilateral primary osteoarthritis, left knee: Secondary | ICD-10-CM | POA: Diagnosis not present

## 2020-02-23 DIAGNOSIS — M545 Low back pain: Secondary | ICD-10-CM | POA: Diagnosis not present

## 2020-02-23 DIAGNOSIS — M2560 Stiffness of unspecified joint, not elsewhere classified: Secondary | ICD-10-CM | POA: Diagnosis not present

## 2020-03-01 DIAGNOSIS — H2513 Age-related nuclear cataract, bilateral: Secondary | ICD-10-CM | POA: Diagnosis not present

## 2020-03-01 DIAGNOSIS — H04123 Dry eye syndrome of bilateral lacrimal glands: Secondary | ICD-10-CM | POA: Diagnosis not present

## 2020-03-01 DIAGNOSIS — H524 Presbyopia: Secondary | ICD-10-CM | POA: Diagnosis not present

## 2020-03-08 DIAGNOSIS — M545 Low back pain: Secondary | ICD-10-CM | POA: Diagnosis not present

## 2020-03-08 DIAGNOSIS — M2560 Stiffness of unspecified joint, not elsewhere classified: Secondary | ICD-10-CM | POA: Diagnosis not present

## 2020-03-22 DIAGNOSIS — M545 Low back pain: Secondary | ICD-10-CM | POA: Diagnosis not present

## 2020-03-22 DIAGNOSIS — M2560 Stiffness of unspecified joint, not elsewhere classified: Secondary | ICD-10-CM | POA: Diagnosis not present

## 2020-04-04 DIAGNOSIS — M545 Low back pain: Secondary | ICD-10-CM | POA: Diagnosis not present

## 2020-04-04 DIAGNOSIS — M2560 Stiffness of unspecified joint, not elsewhere classified: Secondary | ICD-10-CM | POA: Diagnosis not present

## 2020-04-18 DIAGNOSIS — M2560 Stiffness of unspecified joint, not elsewhere classified: Secondary | ICD-10-CM | POA: Diagnosis not present

## 2020-04-18 DIAGNOSIS — M545 Low back pain, unspecified: Secondary | ICD-10-CM | POA: Diagnosis not present

## 2020-04-19 DIAGNOSIS — Z20822 Contact with and (suspected) exposure to covid-19: Secondary | ICD-10-CM | POA: Diagnosis not present

## 2020-04-19 DIAGNOSIS — R509 Fever, unspecified: Secondary | ICD-10-CM | POA: Diagnosis not present

## 2020-05-27 ENCOUNTER — Telehealth: Payer: Self-pay | Admitting: Gastroenterology

## 2020-05-27 NOTE — Telephone Encounter (Signed)
Primary gastroenterologist: Dr. Juanita Craver After hours call re: no bowel movement following colonoscopy  Answering service reported: Colonoscopy 05/24/20. Patient has not had a bowel movement since that time.   Unfortunately, I was unable to connect with the patient despite multiple calls. No voicemail. Discussed with the answering service who will take additional phone numbers and ask the patient to stay on the line if she calls back.

## 2020-07-26 ENCOUNTER — Other Ambulatory Visit: Payer: Self-pay | Admitting: Obstetrics and Gynecology

## 2020-07-26 DIAGNOSIS — Z1231 Encounter for screening mammogram for malignant neoplasm of breast: Secondary | ICD-10-CM

## 2020-09-12 ENCOUNTER — Ambulatory Visit
Admission: RE | Admit: 2020-09-12 | Discharge: 2020-09-12 | Disposition: A | Discharge status: Home / Self Care | Payer: Medicare Other | Source: Ambulatory Visit | Attending: Obstetrics and Gynecology | Admitting: Obstetrics and Gynecology

## 2020-09-12 ENCOUNTER — Other Ambulatory Visit: Payer: Self-pay

## 2020-09-12 DIAGNOSIS — Z1231 Encounter for screening mammogram for malignant neoplasm of breast: Secondary | ICD-10-CM | POA: Diagnosis not present

## 2020-09-15 ENCOUNTER — Other Ambulatory Visit: Payer: Self-pay | Admitting: Obstetrics and Gynecology

## 2020-09-15 DIAGNOSIS — R928 Other abnormal and inconclusive findings on diagnostic imaging of breast: Secondary | ICD-10-CM

## 2020-09-18 DIAGNOSIS — R300 Dysuria: Secondary | ICD-10-CM | POA: Diagnosis not present

## 2020-09-18 DIAGNOSIS — N3001 Acute cystitis with hematuria: Secondary | ICD-10-CM | POA: Diagnosis not present

## 2020-09-25 DIAGNOSIS — R0981 Nasal congestion: Secondary | ICD-10-CM | POA: Diagnosis not present

## 2020-09-25 DIAGNOSIS — R197 Diarrhea, unspecified: Secondary | ICD-10-CM | POA: Diagnosis not present

## 2020-09-25 DIAGNOSIS — R5383 Other fatigue: Secondary | ICD-10-CM | POA: Diagnosis not present

## 2020-09-28 DIAGNOSIS — I10 Essential (primary) hypertension: Secondary | ICD-10-CM | POA: Diagnosis not present

## 2020-09-28 DIAGNOSIS — R002 Palpitations: Secondary | ICD-10-CM | POA: Diagnosis not present

## 2020-09-30 ENCOUNTER — Other Ambulatory Visit: Payer: Self-pay

## 2020-09-30 ENCOUNTER — Ambulatory Visit
Admission: RE | Admit: 2020-09-30 | Discharge: 2020-09-30 | Disposition: A | Discharge status: Home / Self Care | Payer: Medicare Other | Source: Ambulatory Visit | Attending: Obstetrics and Gynecology | Admitting: Obstetrics and Gynecology

## 2020-09-30 DIAGNOSIS — R928 Other abnormal and inconclusive findings on diagnostic imaging of breast: Secondary | ICD-10-CM

## 2020-09-30 DIAGNOSIS — R922 Inconclusive mammogram: Secondary | ICD-10-CM | POA: Diagnosis not present

## 2020-09-30 DIAGNOSIS — R921 Mammographic calcification found on diagnostic imaging of breast: Secondary | ICD-10-CM | POA: Diagnosis not present

## 2020-10-06 DIAGNOSIS — N390 Urinary tract infection, site not specified: Secondary | ICD-10-CM | POA: Diagnosis not present

## 2020-10-17 DIAGNOSIS — R8290 Unspecified abnormal findings in urine: Secondary | ICD-10-CM | POA: Diagnosis not present

## 2020-10-17 DIAGNOSIS — Z0184 Encounter for antibody response examination: Secondary | ICD-10-CM | POA: Diagnosis not present

## 2020-10-17 DIAGNOSIS — Z1389 Encounter for screening for other disorder: Secondary | ICD-10-CM | POA: Diagnosis not present

## 2020-10-17 DIAGNOSIS — F418 Other specified anxiety disorders: Secondary | ICD-10-CM | POA: Diagnosis not present

## 2020-10-17 DIAGNOSIS — Z7189 Other specified counseling: Secondary | ICD-10-CM | POA: Diagnosis not present

## 2020-10-17 DIAGNOSIS — Z0000 Encounter for general adult medical examination without abnormal findings: Secondary | ICD-10-CM | POA: Diagnosis not present

## 2020-10-17 DIAGNOSIS — G4700 Insomnia, unspecified: Secondary | ICD-10-CM | POA: Diagnosis not present

## 2020-10-17 DIAGNOSIS — A084 Viral intestinal infection, unspecified: Secondary | ICD-10-CM | POA: Diagnosis not present

## 2020-10-17 DIAGNOSIS — M810 Age-related osteoporosis without current pathological fracture: Secondary | ICD-10-CM | POA: Diagnosis not present

## 2020-10-17 DIAGNOSIS — E039 Hypothyroidism, unspecified: Secondary | ICD-10-CM | POA: Diagnosis not present

## 2020-10-17 DIAGNOSIS — I701 Atherosclerosis of renal artery: Secondary | ICD-10-CM | POA: Diagnosis not present

## 2020-10-17 DIAGNOSIS — E785 Hyperlipidemia, unspecified: Secondary | ICD-10-CM | POA: Diagnosis not present

## 2020-10-17 DIAGNOSIS — E559 Vitamin D deficiency, unspecified: Secondary | ICD-10-CM | POA: Diagnosis not present

## 2020-10-19 DIAGNOSIS — E785 Hyperlipidemia, unspecified: Secondary | ICD-10-CM | POA: Diagnosis not present

## 2020-10-19 DIAGNOSIS — I10 Essential (primary) hypertension: Secondary | ICD-10-CM | POA: Diagnosis not present

## 2020-10-19 DIAGNOSIS — E039 Hypothyroidism, unspecified: Secondary | ICD-10-CM | POA: Diagnosis not present

## 2020-10-19 DIAGNOSIS — R002 Palpitations: Secondary | ICD-10-CM | POA: Diagnosis not present

## 2020-10-19 DIAGNOSIS — J302 Other seasonal allergic rhinitis: Secondary | ICD-10-CM | POA: Diagnosis not present

## 2020-10-20 DIAGNOSIS — R001 Bradycardia, unspecified: Secondary | ICD-10-CM | POA: Diagnosis not present

## 2020-11-03 DIAGNOSIS — E039 Hypothyroidism, unspecified: Secondary | ICD-10-CM | POA: Diagnosis not present

## 2020-11-03 DIAGNOSIS — I10 Essential (primary) hypertension: Secondary | ICD-10-CM | POA: Diagnosis not present

## 2020-11-07 DIAGNOSIS — R002 Palpitations: Secondary | ICD-10-CM | POA: Diagnosis not present

## 2020-11-08 DIAGNOSIS — I491 Atrial premature depolarization: Secondary | ICD-10-CM | POA: Diagnosis not present

## 2020-11-08 DIAGNOSIS — I471 Supraventricular tachycardia: Secondary | ICD-10-CM | POA: Diagnosis not present

## 2020-11-17 DIAGNOSIS — Z01419 Encounter for gynecological examination (general) (routine) without abnormal findings: Secondary | ICD-10-CM | POA: Diagnosis not present

## 2020-11-17 DIAGNOSIS — Z6825 Body mass index (BMI) 25.0-25.9, adult: Secondary | ICD-10-CM | POA: Diagnosis not present

## 2020-11-28 DIAGNOSIS — D225 Melanocytic nevi of trunk: Secondary | ICD-10-CM | POA: Diagnosis not present

## 2020-11-28 DIAGNOSIS — L821 Other seborrheic keratosis: Secondary | ICD-10-CM | POA: Diagnosis not present

## 2020-11-28 DIAGNOSIS — L720 Epidermal cyst: Secondary | ICD-10-CM | POA: Diagnosis not present

## 2021-03-03 DIAGNOSIS — H34211 Partial retinal artery occlusion, right eye: Secondary | ICD-10-CM | POA: Diagnosis not present

## 2021-03-03 DIAGNOSIS — H04123 Dry eye syndrome of bilateral lacrimal glands: Secondary | ICD-10-CM | POA: Diagnosis not present

## 2021-03-03 DIAGNOSIS — H2513 Age-related nuclear cataract, bilateral: Secondary | ICD-10-CM | POA: Diagnosis not present

## 2021-03-03 DIAGNOSIS — H524 Presbyopia: Secondary | ICD-10-CM | POA: Diagnosis not present

## 2021-03-06 DIAGNOSIS — H8110 Benign paroxysmal vertigo, unspecified ear: Secondary | ICD-10-CM | POA: Diagnosis not present

## 2021-03-06 DIAGNOSIS — R42 Dizziness and giddiness: Secondary | ICD-10-CM | POA: Diagnosis not present

## 2021-03-06 DIAGNOSIS — H8112 Benign paroxysmal vertigo, left ear: Secondary | ICD-10-CM | POA: Diagnosis not present

## 2021-03-07 ENCOUNTER — Other Ambulatory Visit (HOSPITAL_COMMUNITY): Payer: Self-pay | Admitting: Ophthalmology

## 2021-03-07 ENCOUNTER — Other Ambulatory Visit: Payer: Self-pay

## 2021-03-07 ENCOUNTER — Ambulatory Visit (HOSPITAL_COMMUNITY)
Admission: RE | Admit: 2021-03-07 | Discharge: 2021-03-07 | Disposition: A | Discharge status: Home / Self Care | Payer: Medicare Other | Source: Ambulatory Visit | Attending: Cardiovascular Disease | Admitting: Cardiovascular Disease

## 2021-03-07 DIAGNOSIS — H34219 Partial retinal artery occlusion, unspecified eye: Secondary | ICD-10-CM | POA: Diagnosis not present

## 2021-03-07 DIAGNOSIS — H34211 Partial retinal artery occlusion, right eye: Secondary | ICD-10-CM | POA: Diagnosis not present

## 2021-03-08 DIAGNOSIS — R42 Dizziness and giddiness: Secondary | ICD-10-CM | POA: Diagnosis not present

## 2021-03-08 DIAGNOSIS — H8112 Benign paroxysmal vertigo, left ear: Secondary | ICD-10-CM | POA: Diagnosis not present

## 2021-03-13 ENCOUNTER — Telehealth: Payer: Self-pay | Admitting: Cardiology

## 2021-03-13 NOTE — Telephone Encounter (Signed)
Angelica [facility]ine with [facility] states Dr. Valetta Close has questions regarding patient's Carotid. They reviewed the results and would like to know if someone can take another look at ICA of right side and call them back. Angelica Levine states the right side is where Dr. Valetta Close initially had concerns.  Phone #: [phone]

## 2021-03-13 NOTE — Telephone Encounter (Signed)
The right carotid looks fine. Was there a prior study or perhaps alternative imaging that had shown some disease in the right carotid   Carlyle Dolly MD

## 2021-03-13 NOTE — Telephone Encounter (Signed)
Lm to call back ./cy 

## 2021-03-14 NOTE — Telephone Encounter (Signed)
I spoke with Angelica Levine at [facility].  Study was ordered after patient saw ophthalmologist who had concerns about right carotid. No previous studies done.  Angelica Levine is asking about amount of stenosis on right carotid and is asking if this can be noted on study She can be reached at [phone]

## 2021-03-15 NOTE — Telephone Encounter (Signed)
Nevin Bloodgood at [facility] is calling back requesting to speak with Altha Harm in regards to this.

## 2021-03-15 NOTE — Telephone Encounter (Signed)
The report is in epic and is self explanatory, can be sent where ever it needs to go and reviewed by any provider that requests it. There is nothing else I need to be contacted on about for this   Zandra Abts MD

## 2021-03-15 NOTE — Telephone Encounter (Signed)
Left a message. Please refer to Dr. Nelly Laurence note pertaining to this.

## 2021-03-15 NOTE — Telephone Encounter (Signed)
Will forward to Dr. Branch. 

## 2021-03-22 DIAGNOSIS — I10 Essential (primary) hypertension: Secondary | ICD-10-CM | POA: Diagnosis not present

## 2021-03-22 DIAGNOSIS — R002 Palpitations: Secondary | ICD-10-CM | POA: Diagnosis not present

## 2021-03-23 ENCOUNTER — Ambulatory Visit (HOSPITAL_COMMUNITY): Payer: Medicare Other | Attending: Cardiology

## 2021-03-23 ENCOUNTER — Other Ambulatory Visit: Payer: Self-pay

## 2021-03-23 DIAGNOSIS — H34211 Partial retinal artery occlusion, right eye: Secondary | ICD-10-CM | POA: Insufficient documentation

## 2021-03-23 LAB — ECHOCARDIOGRAM COMPLETE
Area-P 1/2: 3.19 cm2
S' Lateral: 2.2 cm

## 2021-04-03 DIAGNOSIS — H34211 Partial retinal artery occlusion, right eye: Secondary | ICD-10-CM | POA: Diagnosis not present

## 2021-04-19 NOTE — Telephone Encounter (Addendum)
Spoke to [place] at the [employer]. She has been trying to get in touch with the office concerning the Carotid Results. They were calling to get  clarification on the right side. They have read the report and need more clarification.   Spoke with the Engineer, building services of [place]. Message routed to her.   Summary:  Right Carotid: The extracranial vessels were near-normal with only minimal  wall thickening or plaque.   Left Carotid: Velocities in the left ICA are consistent with a 1-39%  stenosis.   Vertebrals:  Bilateral vertebral arteries demonstrate antegrade flow.  Subclavians: Normal flow hemodynamics were seen in bilateral subclavian               arteries.

## 2021-04-19 NOTE — Telephone Encounter (Signed)
Nevin Bloodgood from [facility] calling back requesting clarification on the ICA on the right carotid the same way it was discussed on the left. Phone: [phone]

## 2021-04-20 NOTE — Telephone Encounter (Signed)
Spoke with patient and described that a "near normal" result in a carotid study indicates that the study is not absolutely normal, and there may be a tiny amount of plaque noted.  I also explained that the limitation of the test is that we can say whether or not there is plaque, but we cannot know whether a piece of plaque broke off and traveled somewhere.  The patient thanked me for the call, and said it was her new doctor who was questioning the results, not herself.  She returns to her doctor 04/26/21 for blood work, and said if they have any more questions, she will give them my number,

## 2021-04-20 NOTE — Telephone Encounter (Signed)
Attempted to call patient regarding Carotid results, to explain the findings.  LMOM for her to return my call tomorrow when I am in the office.

## 2021-04-26 DIAGNOSIS — R002 Palpitations: Secondary | ICD-10-CM | POA: Diagnosis not present

## 2021-04-29 DIAGNOSIS — R300 Dysuria: Secondary | ICD-10-CM | POA: Diagnosis not present

## 2021-05-24 DIAGNOSIS — M79672 Pain in left foot: Secondary | ICD-10-CM | POA: Diagnosis not present

## 2021-05-24 DIAGNOSIS — M21611 Bunion of right foot: Secondary | ICD-10-CM | POA: Diagnosis not present

## 2021-05-24 DIAGNOSIS — M79671 Pain in right foot: Secondary | ICD-10-CM | POA: Diagnosis not present

## 2021-05-24 DIAGNOSIS — M21619 Bunion of unspecified foot: Secondary | ICD-10-CM | POA: Diagnosis not present

## 2021-05-24 DIAGNOSIS — B070 Plantar wart: Secondary | ICD-10-CM | POA: Diagnosis not present

## 2021-05-24 DIAGNOSIS — M2041 Other hammer toe(s) (acquired), right foot: Secondary | ICD-10-CM | POA: Diagnosis not present

## 2021-05-26 DIAGNOSIS — Z780 Asymptomatic menopausal state: Secondary | ICD-10-CM | POA: Diagnosis not present

## 2021-05-26 DIAGNOSIS — M810 Age-related osteoporosis without current pathological fracture: Secondary | ICD-10-CM | POA: Diagnosis not present

## 2021-05-26 DIAGNOSIS — M8589 Other specified disorders of bone density and structure, multiple sites: Secondary | ICD-10-CM | POA: Diagnosis not present

## 2021-06-05 DIAGNOSIS — M810 Age-related osteoporosis without current pathological fracture: Secondary | ICD-10-CM | POA: Diagnosis not present

## 2021-08-01 DIAGNOSIS — Z5181 Encounter for therapeutic drug level monitoring: Secondary | ICD-10-CM | POA: Diagnosis not present

## 2021-08-01 DIAGNOSIS — I10 Essential (primary) hypertension: Secondary | ICD-10-CM | POA: Diagnosis not present

## 2021-08-01 DIAGNOSIS — E7800 Pure hypercholesterolemia, unspecified: Secondary | ICD-10-CM | POA: Diagnosis not present

## 2021-08-03 ENCOUNTER — Other Ambulatory Visit: Payer: Self-pay | Admitting: Obstetrics and Gynecology

## 2021-08-03 DIAGNOSIS — Z1231 Encounter for screening mammogram for malignant neoplasm of breast: Secondary | ICD-10-CM

## 2021-08-04 DIAGNOSIS — R7982 Elevated C-reactive protein (CRP): Secondary | ICD-10-CM | POA: Diagnosis not present

## 2021-08-04 DIAGNOSIS — E785 Hyperlipidemia, unspecified: Secondary | ICD-10-CM | POA: Diagnosis not present

## 2021-09-07 DIAGNOSIS — E7800 Pure hypercholesterolemia, unspecified: Secondary | ICD-10-CM | POA: Diagnosis not present

## 2021-09-07 DIAGNOSIS — I10 Essential (primary) hypertension: Secondary | ICD-10-CM | POA: Diagnosis not present

## 2021-09-08 DIAGNOSIS — H9202 Otalgia, left ear: Secondary | ICD-10-CM | POA: Diagnosis not present

## 2021-09-13 ENCOUNTER — Ambulatory Visit: Payer: Medicare Other

## 2021-10-10 DIAGNOSIS — H34211 Partial retinal artery occlusion, right eye: Secondary | ICD-10-CM | POA: Diagnosis not present

## 2021-10-10 DIAGNOSIS — I10 Essential (primary) hypertension: Secondary | ICD-10-CM | POA: Diagnosis not present

## 2021-10-10 DIAGNOSIS — E7800 Pure hypercholesterolemia, unspecified: Secondary | ICD-10-CM | POA: Diagnosis not present

## 2021-10-17 ENCOUNTER — Ambulatory Visit
Admission: RE | Admit: 2021-10-17 | Discharge: 2021-10-17 | Disposition: A | Discharge status: Home / Self Care | Payer: Medicare Other | Source: Ambulatory Visit | Attending: Obstetrics and Gynecology | Admitting: Obstetrics and Gynecology

## 2021-10-17 DIAGNOSIS — Z1231 Encounter for screening mammogram for malignant neoplasm of breast: Secondary | ICD-10-CM

## 2021-10-27 DIAGNOSIS — M810 Age-related osteoporosis without current pathological fracture: Secondary | ICD-10-CM | POA: Diagnosis not present

## 2021-10-27 DIAGNOSIS — I10 Essential (primary) hypertension: Secondary | ICD-10-CM | POA: Diagnosis not present

## 2021-11-03 DIAGNOSIS — M25572 Pain in left ankle and joints of left foot: Secondary | ICD-10-CM | POA: Diagnosis not present

## 2021-11-03 DIAGNOSIS — M79672 Pain in left foot: Secondary | ICD-10-CM | POA: Diagnosis not present

## 2021-11-03 DIAGNOSIS — S99922A Unspecified injury of left foot, initial encounter: Secondary | ICD-10-CM | POA: Diagnosis not present

## 2021-11-03 DIAGNOSIS — E7800 Pure hypercholesterolemia, unspecified: Secondary | ICD-10-CM | POA: Diagnosis not present

## 2021-11-03 DIAGNOSIS — I10 Essential (primary) hypertension: Secondary | ICD-10-CM | POA: Diagnosis not present

## 2021-11-03 DIAGNOSIS — M19072 Primary osteoarthritis, left ankle and foot: Secondary | ICD-10-CM | POA: Diagnosis not present

## 2021-11-16 DIAGNOSIS — E7800 Pure hypercholesterolemia, unspecified: Secondary | ICD-10-CM | POA: Diagnosis not present

## 2021-11-16 DIAGNOSIS — I10 Essential (primary) hypertension: Secondary | ICD-10-CM | POA: Diagnosis not present

## 2021-11-20 DIAGNOSIS — Z6825 Body mass index (BMI) 25.0-25.9, adult: Secondary | ICD-10-CM | POA: Diagnosis not present

## 2021-11-20 DIAGNOSIS — Z01419 Encounter for gynecological examination (general) (routine) without abnormal findings: Secondary | ICD-10-CM | POA: Diagnosis not present

## 2021-11-28 DIAGNOSIS — L821 Other seborrheic keratosis: Secondary | ICD-10-CM | POA: Diagnosis not present

## 2021-11-28 DIAGNOSIS — D2262 Melanocytic nevi of left upper limb, including shoulder: Secondary | ICD-10-CM | POA: Diagnosis not present

## 2021-11-28 DIAGNOSIS — D2261 Melanocytic nevi of right upper limb, including shoulder: Secondary | ICD-10-CM | POA: Diagnosis not present

## 2021-11-28 DIAGNOSIS — L720 Epidermal cyst: Secondary | ICD-10-CM | POA: Diagnosis not present

## 2021-11-28 DIAGNOSIS — D225 Melanocytic nevi of trunk: Secondary | ICD-10-CM | POA: Diagnosis not present

## 2021-12-19 DIAGNOSIS — I10 Essential (primary) hypertension: Secondary | ICD-10-CM | POA: Diagnosis not present

## 2021-12-19 DIAGNOSIS — R946 Abnormal results of thyroid function studies: Secondary | ICD-10-CM | POA: Diagnosis not present

## 2021-12-22 DIAGNOSIS — E039 Hypothyroidism, unspecified: Secondary | ICD-10-CM | POA: Diagnosis not present

## 2021-12-22 DIAGNOSIS — I10 Essential (primary) hypertension: Secondary | ICD-10-CM | POA: Diagnosis not present

## 2022-01-31 DIAGNOSIS — E039 Hypothyroidism, unspecified: Secondary | ICD-10-CM | POA: Diagnosis not present

## 2022-01-31 DIAGNOSIS — I10 Essential (primary) hypertension: Secondary | ICD-10-CM | POA: Diagnosis not present

## 2022-02-26 DIAGNOSIS — I10 Essential (primary) hypertension: Secondary | ICD-10-CM | POA: Diagnosis not present

## 2022-02-26 DIAGNOSIS — E7800 Pure hypercholesterolemia, unspecified: Secondary | ICD-10-CM | POA: Diagnosis not present

## 2022-03-09 DIAGNOSIS — M1712 Unilateral primary osteoarthritis, left knee: Secondary | ICD-10-CM | POA: Diagnosis not present

## 2022-03-13 DIAGNOSIS — M25562 Pain in left knee: Secondary | ICD-10-CM | POA: Diagnosis not present

## 2022-03-13 DIAGNOSIS — M6281 Muscle weakness (generalized): Secondary | ICD-10-CM | POA: Diagnosis not present

## 2022-03-22 DIAGNOSIS — M25562 Pain in left knee: Secondary | ICD-10-CM | POA: Diagnosis not present

## 2022-03-22 DIAGNOSIS — M6281 Muscle weakness (generalized): Secondary | ICD-10-CM | POA: Diagnosis not present

## 2022-03-26 DIAGNOSIS — M6281 Muscle weakness (generalized): Secondary | ICD-10-CM | POA: Diagnosis not present

## 2022-03-26 DIAGNOSIS — M25562 Pain in left knee: Secondary | ICD-10-CM | POA: Diagnosis not present

## 2022-03-28 DIAGNOSIS — R002 Palpitations: Secondary | ICD-10-CM | POA: Diagnosis not present

## 2022-03-28 DIAGNOSIS — F419 Anxiety disorder, unspecified: Secondary | ICD-10-CM | POA: Diagnosis not present

## 2022-03-28 DIAGNOSIS — I10 Essential (primary) hypertension: Secondary | ICD-10-CM | POA: Diagnosis not present

## 2022-03-29 DIAGNOSIS — M6281 Muscle weakness (generalized): Secondary | ICD-10-CM | POA: Diagnosis not present

## 2022-03-29 DIAGNOSIS — M25562 Pain in left knee: Secondary | ICD-10-CM | POA: Diagnosis not present

## 2022-04-02 DIAGNOSIS — M25562 Pain in left knee: Secondary | ICD-10-CM | POA: Diagnosis not present

## 2022-04-02 DIAGNOSIS — M6281 Muscle weakness (generalized): Secondary | ICD-10-CM | POA: Diagnosis not present

## 2022-04-09 DIAGNOSIS — M6281 Muscle weakness (generalized): Secondary | ICD-10-CM | POA: Diagnosis not present

## 2022-04-09 DIAGNOSIS — M25562 Pain in left knee: Secondary | ICD-10-CM | POA: Diagnosis not present

## 2022-04-13 DIAGNOSIS — M25562 Pain in left knee: Secondary | ICD-10-CM | POA: Diagnosis not present

## 2022-04-13 DIAGNOSIS — M6281 Muscle weakness (generalized): Secondary | ICD-10-CM | POA: Diagnosis not present

## 2022-04-17 DIAGNOSIS — M25562 Pain in left knee: Secondary | ICD-10-CM | POA: Diagnosis not present

## 2022-04-17 DIAGNOSIS — M6281 Muscle weakness (generalized): Secondary | ICD-10-CM | POA: Diagnosis not present

## 2022-04-19 DIAGNOSIS — I10 Essential (primary) hypertension: Secondary | ICD-10-CM | POA: Diagnosis not present

## 2022-04-19 DIAGNOSIS — Z136 Encounter for screening for cardiovascular disorders: Secondary | ICD-10-CM | POA: Diagnosis not present

## 2022-04-19 DIAGNOSIS — R7989 Other specified abnormal findings of blood chemistry: Secondary | ICD-10-CM | POA: Diagnosis not present

## 2022-04-20 DIAGNOSIS — M1712 Unilateral primary osteoarthritis, left knee: Secondary | ICD-10-CM | POA: Diagnosis not present

## 2022-04-25 DIAGNOSIS — M6281 Muscle weakness (generalized): Secondary | ICD-10-CM | POA: Diagnosis not present

## 2022-04-25 DIAGNOSIS — M25562 Pain in left knee: Secondary | ICD-10-CM | POA: Diagnosis not present

## 2022-05-04 DIAGNOSIS — M25562 Pain in left knee: Secondary | ICD-10-CM | POA: Diagnosis not present

## 2022-05-04 DIAGNOSIS — M6281 Muscle weakness (generalized): Secondary | ICD-10-CM | POA: Diagnosis not present

## 2022-05-07 DIAGNOSIS — M6281 Muscle weakness (generalized): Secondary | ICD-10-CM | POA: Diagnosis not present

## 2022-05-07 DIAGNOSIS — M25562 Pain in left knee: Secondary | ICD-10-CM | POA: Diagnosis not present

## 2022-05-11 DIAGNOSIS — M6281 Muscle weakness (generalized): Secondary | ICD-10-CM | POA: Diagnosis not present

## 2022-05-11 DIAGNOSIS — M25562 Pain in left knee: Secondary | ICD-10-CM | POA: Diagnosis not present

## 2022-05-15 DIAGNOSIS — I10 Essential (primary) hypertension: Secondary | ICD-10-CM | POA: Diagnosis not present

## 2022-05-15 DIAGNOSIS — M25562 Pain in left knee: Secondary | ICD-10-CM | POA: Diagnosis not present

## 2022-05-15 DIAGNOSIS — M6281 Muscle weakness (generalized): Secondary | ICD-10-CM | POA: Diagnosis not present

## 2022-05-15 DIAGNOSIS — E039 Hypothyroidism, unspecified: Secondary | ICD-10-CM | POA: Diagnosis not present

## 2022-05-18 DIAGNOSIS — M6281 Muscle weakness (generalized): Secondary | ICD-10-CM | POA: Diagnosis not present

## 2022-05-18 DIAGNOSIS — M25562 Pain in left knee: Secondary | ICD-10-CM | POA: Diagnosis not present

## 2022-05-21 DIAGNOSIS — M25562 Pain in left knee: Secondary | ICD-10-CM | POA: Diagnosis not present

## 2022-05-21 DIAGNOSIS — M6281 Muscle weakness (generalized): Secondary | ICD-10-CM | POA: Diagnosis not present

## 2022-05-23 DIAGNOSIS — M25562 Pain in left knee: Secondary | ICD-10-CM | POA: Diagnosis not present

## 2022-05-23 DIAGNOSIS — M6281 Muscle weakness (generalized): Secondary | ICD-10-CM | POA: Diagnosis not present

## 2022-05-30 DIAGNOSIS — E785 Hyperlipidemia, unspecified: Secondary | ICD-10-CM | POA: Diagnosis not present

## 2022-05-30 DIAGNOSIS — R002 Palpitations: Secondary | ICD-10-CM | POA: Diagnosis not present

## 2022-06-14 DIAGNOSIS — I10 Essential (primary) hypertension: Secondary | ICD-10-CM | POA: Diagnosis not present

## 2022-06-14 DIAGNOSIS — E039 Hypothyroidism, unspecified: Secondary | ICD-10-CM | POA: Diagnosis not present

## 2022-06-29 ENCOUNTER — Other Ambulatory Visit: Payer: Self-pay | Admitting: Obstetrics and Gynecology

## 2022-06-29 DIAGNOSIS — Z1231 Encounter for screening mammogram for malignant neoplasm of breast: Secondary | ICD-10-CM

## 2022-07-15 DIAGNOSIS — E039 Hypothyroidism, unspecified: Secondary | ICD-10-CM | POA: Diagnosis not present

## 2022-07-15 DIAGNOSIS — I10 Essential (primary) hypertension: Secondary | ICD-10-CM | POA: Diagnosis not present

## 2022-10-19 ENCOUNTER — Ambulatory Visit: Payer: Medicare Other

## 2022-10-26 ENCOUNTER — Ambulatory Visit
Admission: RE | Admit: 2022-10-26 | Discharge: 2022-10-26 | Disposition: A | Discharge status: Home / Self Care | Payer: Medicare Other | Source: Ambulatory Visit | Attending: Obstetrics and Gynecology | Admitting: Obstetrics and Gynecology

## 2022-10-26 DIAGNOSIS — Z1231 Encounter for screening mammogram for malignant neoplasm of breast: Secondary | ICD-10-CM

## 2023-09-03 ENCOUNTER — Other Ambulatory Visit: Payer: Self-pay | Admitting: Family Medicine

## 2023-09-03 DIAGNOSIS — Z1231 Encounter for screening mammogram for malignant neoplasm of breast: Secondary | ICD-10-CM

## 2023-11-12 ENCOUNTER — Ambulatory Visit
Admission: RE | Admit: 2023-11-12 | Discharge: 2023-11-12 | Disposition: A | Discharge status: Home / Self Care | Payer: BLUE CROSS/BLUE SHIELD | Source: Ambulatory Visit | Attending: Family Medicine | Admitting: Family Medicine

## 2023-11-12 DIAGNOSIS — Z1231 Encounter for screening mammogram for malignant neoplasm of breast: Secondary | ICD-10-CM

## 2023-11-14 ENCOUNTER — Ambulatory Visit (INDEPENDENT_AMBULATORY_CARE_PROVIDER_SITE_OTHER): Payer: BLUE CROSS/BLUE SHIELD | Admitting: Otolaryngology

## 2023-11-14 VITALS — BP 146/80 | HR 61 | Ht 64.0 in | Wt 147.0 lb

## 2023-11-14 DIAGNOSIS — H903 Sensorineural hearing loss, bilateral: Secondary | ICD-10-CM | POA: Diagnosis not present

## 2023-11-14 DIAGNOSIS — H6123 Impacted cerumen, bilateral: Secondary | ICD-10-CM

## 2023-11-14 DIAGNOSIS — J310 Chronic rhinitis: Secondary | ICD-10-CM | POA: Diagnosis not present

## 2023-11-14 DIAGNOSIS — J343 Hypertrophy of nasal turbinates: Secondary | ICD-10-CM | POA: Diagnosis not present

## 2023-11-14 DIAGNOSIS — R0981 Nasal congestion: Secondary | ICD-10-CM | POA: Diagnosis not present

## 2023-11-16 DIAGNOSIS — H6123 Impacted cerumen, bilateral: Secondary | ICD-10-CM | POA: Insufficient documentation

## 2023-11-16 DIAGNOSIS — J310 Chronic rhinitis: Secondary | ICD-10-CM | POA: Insufficient documentation

## 2023-11-16 DIAGNOSIS — H903 Sensorineural hearing loss, bilateral: Secondary | ICD-10-CM | POA: Insufficient documentation

## 2023-11-16 DIAGNOSIS — J343 Hypertrophy of nasal turbinates: Secondary | ICD-10-CM | POA: Insufficient documentation

## 2023-11-16 NOTE — Progress Notes (Signed)
 Patient ID: Angelica Levine, female   DOB: October 14, 1950, 73 y.o.   MRN: [id number]  Follow-up: Hearing loss, chronic nasal congestion  HPI: The patient is a 73 year old female who returns today for her follow-up evaluation.  She was last seen in March 2024.  At that time, she was complaining of bilateral hearing loss.  She was noted to have cerumen impaction and bilateral high-frequency sensorineural hearing loss.  She was treated with cerumen disimpaction.  The patient returns today reporting no significant change in her hearing.  However, she has noted increasing nasal congestion over the past few months.  She has a history of environmental allergies.  She is currently on Zyrtec and nasal saline irrigation.  She denies any facial pain, otalgia, otorrhea, or vertigo.  Exam: General: Communicates without difficulty, well nourished, no acute distress. Head: Normocephalic, no evidence injury, no tenderness, facial buttresses intact without stepoff. Face/sinus: No tenderness to palpation and percussion. Facial movement is normal and symmetric. Eyes: PERRL, EOMI. No scleral icterus, conjunctivae clear. Neuro: CN II exam reveals vision grossly intact.  No nystagmus at any point of gaze. Ears: Auricles well formed without lesions.  Bilateral cerumen impaction.  Nose: External evaluation reveals normal support and skin without lesions.  Dorsum is intact.  Anterior rhinoscopy reveals congested mucosa over anterior aspect of inferior turbinates and intact septum.  No purulence noted. Oral:  Oral cavity and oropharynx are intact, symmetric, without erythema or edema.  Mucosa is moist without lesions. Neck: Full range of motion without pain.  There is no significant lymphadenopathy.  No masses palpable.  Thyroid  bed within normal limits to palpation.  Parotid glands and submandibular glands equal bilaterally without mass.  Trachea is midline. Neuro:  CN 2-12 grossly intact.   Procedure: Bilateral cerumen  disimpaction Anesthesia: None Description: Under the operating microscope, the cerumen is carefully removed with a combination of cerumen currette, alligator forceps, and suction catheters.  After the cerumen is removed, the TMs are noted to be normal.  No mass, erythema, or lesions. The patient tolerated the procedure well.    Assessment: 1.  Bilateral cerumen impaction.  After the disimpaction procedure, both tympanic membranes and middle ear spaces are noted to be normal. 2.  Chronic rhinitis with nasal mucosal congestion and bilateral inferior turbinate hypertrophy. 3.  Subjectively stable bilateral high-frequency sensorineural hearing loss.  Plan: 1.  Otomicroscopy with bilateral cerumen disimpaction. 2.  The physical exam findings are reviewed with the patient. 3.  Continue with Zyrtec and nasal saline irrigation as needed. 4.  The patient will return for reevaluation in 1 year, sooner if needed.

## 2024-05-01 ENCOUNTER — Ambulatory Visit: Admitting: Podiatry

## 2024-05-01 DIAGNOSIS — M2011 Hallux valgus (acquired), right foot: Secondary | ICD-10-CM

## 2024-05-01 DIAGNOSIS — L84 Corns and callosities: Secondary | ICD-10-CM

## 2024-05-01 DIAGNOSIS — M21611 Bunion of right foot: Secondary | ICD-10-CM | POA: Diagnosis not present

## 2024-05-01 NOTE — Progress Notes (Unsigned)
 Chief Complaint  Patient presents with   Callouses    Right 2nd toe, inner-digital corn medial side and the lateral side of the hallux.  Not diabetic and no anti coag, but is a bleeder.    HPI: 73 y.o. female presents today with concern of painful corn in the right first interspace.  She also has calluses submet 5 bilateral that she would like shaved.  She acknowledges that she has a bunion deformity.  She is not interested in surgery at this time but is open to conservative measures.  She is a retired Teacher, early years/pre.  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   Sleep apnea with hypersomnolence 04/13/2013   Thyroid  disease    Past Surgical History:  Procedure Laterality Date   ANTERIOR AND POSTERIOR REPAIR N/A 10/26/2019   Procedure: ANTERIOR (CYSTOCELE) AND POSTERIOR REPAIR (RECTOCELE);  Surgeon: Johnnye Ade, MD;  Location: [facility];  Service: Gynecology;  Laterality: N/A;   BREAST BIOPSY Left    over 30 years ago   BREAST SURGERY Left 1987   JOINT REPLACEMENT Bilateral    Laparoscopy 2003 R, 2005 L knee   LAPAROSCOPIC VAGINAL HYSTERECTOMY WITH SALPINGECTOMY Bilateral 10/26/2019   Procedure: LAPAROSCOPIC ASSISTED VAGINAL HYSTERECTOMY WITH BILATERAL SALPINGECTOMY;  Surgeon: Johnnye Ade, MD;  Location: [facility];  Service: Gynecology;  Laterality: Bilateral;  need bed   TONSILLECTOMY  1956   TOTAL KNEE ARTHROPLASTY Bilateral    left -2009,right-2003   TUBAL LIGATION  1984   Allergies  Allergen Reactions   Losartan  Shortness Of Breath   Shrimp [Shellfish Allergy ] Anaphylaxis   Asa [Aspirin] Diarrhea    Stabbing pain in intestines   Egg  Protein-Containing Drug Products Other (See Comments)    Reaction unknown   Linaclotide     Linzess- increased heart rate   Lisinopril  Cough   Penicillins Hives   Sudafed [Pseudoephedrine Hcl] Hypertension    Physical Exam: Palpable pedal pulses noted.  No erythema or edema is appreciated.  There are hyperkeratotic lesions on the medial aspect of the right second toe and the lateral aspect of the right hallux (kissing corns).  No soft tissue breakdown/ulceration is noted.  There is pain on palpation of the lesions.  There is lateral angulation of the right hallux which is manually reducible.  There is a bony prominence on the medial aspect of the first metatarsal head.  There are hyperkeratotic lesions submet 5 bilateral.  There is also a pinch callus on the left hallux.  Epicritic sensation is intact.  On review of her shoes and shoe insoles to determine her foot position inside the shoes, it appears that she could go up a half size to get a slight increase in width and some length to accommodate her toes better.  She has a longer left foot and issues with the toe impressions on her insole hitting almost at the end of the shoe.  Assessment/Plan of Care: 1. Corns   2. Hallux valgus, right   3. Bunion, right foot     Discussed shoe gear and shoe fit.  Recommend sizing up half size.  All hyperkeratotic lesions  were shaved with sterile #313 blade uneventfully.  She was fitted for several different types of gel spacers and toe splints to decrease pressure between the 1st and 2nd toes on the right.  Recommended 40% urea with 2% salicylic acid for maintenance of her corns and calluses.  Ultimately if this keeps recurring or gets more painful she could have the bunion/hallux valgus corrected to remove the pressure against the medial second toe.  Follow-up as needed   Awanda CHARM Imperial, DPM, FACFAS [facility]     [address] [place], [place] [address]                Office [phone]  Fax [phone]

## 2024-07-28 ENCOUNTER — Other Ambulatory Visit: Payer: Self-pay | Admitting: Family Medicine

## 2024-07-28 DIAGNOSIS — Z1231 Encounter for screening mammogram for malignant neoplasm of breast: Secondary | ICD-10-CM

## 2024-08-31 ENCOUNTER — Ambulatory Visit (INDEPENDENT_AMBULATORY_CARE_PROVIDER_SITE_OTHER): Admitting: Audiology

## 2024-11-12 ENCOUNTER — Ambulatory Visit
